# Patient Record
Sex: Female | Born: 1960
Health system: Southern US, Community
[De-identification: ages and names within clinical notes are randomized; demographics above are authoritative.]

## PROBLEM LIST (undated history)

## (undated) DIAGNOSIS — K5900 Constipation, unspecified: Secondary | ICD-10-CM

## (undated) DIAGNOSIS — R600 Localized edema: Secondary | ICD-10-CM

## (undated) DIAGNOSIS — R0683 Snoring: Secondary | ICD-10-CM

## (undated) DIAGNOSIS — F419 Anxiety disorder, unspecified: Secondary | ICD-10-CM

## (undated) DIAGNOSIS — D219 Benign neoplasm of connective and other soft tissue, unspecified: Secondary | ICD-10-CM

## (undated) DIAGNOSIS — E669 Obesity, unspecified: Secondary | ICD-10-CM

## (undated) DIAGNOSIS — C801 Malignant (primary) neoplasm, unspecified: Secondary | ICD-10-CM

## (undated) DIAGNOSIS — M2550 Pain in unspecified joint: Secondary | ICD-10-CM

## (undated) DIAGNOSIS — C539 Malignant neoplasm of cervix uteri, unspecified: Secondary | ICD-10-CM

## (undated) DIAGNOSIS — M549 Dorsalgia, unspecified: Secondary | ICD-10-CM

## (undated) DIAGNOSIS — R7303 Prediabetes: Secondary | ICD-10-CM

## (undated) DIAGNOSIS — I10 Essential (primary) hypertension: Secondary | ICD-10-CM

## (undated) DIAGNOSIS — C439 Malignant melanoma of skin, unspecified: Secondary | ICD-10-CM

## (undated) DIAGNOSIS — F909 Attention-deficit hyperactivity disorder, unspecified type: Secondary | ICD-10-CM

## (undated) DIAGNOSIS — M25519 Pain in unspecified shoulder: Secondary | ICD-10-CM

## (undated) HISTORY — DX: Pain in unspecified shoulder: M25.519

## (undated) HISTORY — DX: Malignant (primary) neoplasm, unspecified: C80.1

## (undated) HISTORY — DX: Benign neoplasm of connective and other soft tissue, unspecified: D21.9

## (undated) HISTORY — PX: MELANOMA EXCISION: SHX5266

## (undated) HISTORY — PX: DILATION AND CURETTAGE OF UTERUS: SHX78

## (undated) HISTORY — DX: Dorsalgia, unspecified: M54.9

## (undated) HISTORY — PX: TONSILLECTOMY: SUR1361

## (undated) HISTORY — DX: Attention-deficit hyperactivity disorder, unspecified type: F90.9

## (undated) HISTORY — DX: Prediabetes: R73.03

## (undated) HISTORY — PX: OTHER SURGICAL HISTORY: SHX169

## (undated) HISTORY — DX: Malignant neoplasm of cervix uteri, unspecified: C53.9

## (undated) HISTORY — DX: Pain in unspecified joint: M25.50

## (undated) HISTORY — DX: Obesity, unspecified: E66.9

## (undated) HISTORY — PX: BUNIONECTOMY: SHX129

## (undated) HISTORY — DX: Snoring: R06.83

## (undated) HISTORY — DX: Constipation, unspecified: K59.00

## (undated) HISTORY — DX: Essential (primary) hypertension: I10

## (undated) HISTORY — PX: FOOT SURGERY: SHX648

## (undated) HISTORY — DX: Localized edema: R60.0

## (undated) HISTORY — DX: Malignant melanoma of skin, unspecified: C43.9

## (undated) HISTORY — DX: Anxiety disorder, unspecified: F41.9

---

## 1985-11-11 DIAGNOSIS — C539 Malignant neoplasm of cervix uteri, unspecified: Secondary | ICD-10-CM

## 1985-11-11 HISTORY — PX: CERVICAL CONE BIOPSY: SUR198

## 1985-11-11 HISTORY — DX: Malignant neoplasm of cervix uteri, unspecified: C53.9

## 1998-02-28 ENCOUNTER — Other Ambulatory Visit: Admission: RE | Admit: 1998-02-28 | Discharge: 1998-02-28 | Payer: Self-pay | Admitting: Obstetrics and Gynecology

## 1998-09-05 ENCOUNTER — Other Ambulatory Visit: Admission: RE | Admit: 1998-09-05 | Discharge: 1998-09-05 | Payer: Self-pay | Admitting: Obstetrics and Gynecology

## 1999-02-20 ENCOUNTER — Other Ambulatory Visit: Admission: RE | Admit: 1999-02-20 | Discharge: 1999-02-20 | Payer: Self-pay | Admitting: Obstetrics and Gynecology

## 1999-09-18 ENCOUNTER — Other Ambulatory Visit: Admission: RE | Admit: 1999-09-18 | Discharge: 1999-09-18 | Payer: Self-pay | Admitting: Obstetrics and Gynecology

## 1999-11-19 ENCOUNTER — Inpatient Hospital Stay (HOSPITAL_COMMUNITY): Admission: AD | Admit: 1999-11-19 | Discharge: 1999-11-19 | Payer: Self-pay | Admitting: Obstetrics & Gynecology

## 2000-03-10 ENCOUNTER — Ambulatory Visit (HOSPITAL_COMMUNITY): Admission: RE | Admit: 2000-03-10 | Discharge: 2000-03-10 | Payer: Self-pay | Admitting: Obstetrics and Gynecology

## 2000-03-16 ENCOUNTER — Inpatient Hospital Stay (HOSPITAL_COMMUNITY): Admission: AD | Admit: 2000-03-16 | Discharge: 2000-03-16 | Payer: Self-pay | Admitting: Obstetrics and Gynecology

## 2000-03-24 ENCOUNTER — Inpatient Hospital Stay (HOSPITAL_COMMUNITY): Admission: AD | Admit: 2000-03-24 | Discharge: 2000-03-28 | Payer: Self-pay | Admitting: Obstetrics and Gynecology

## 2000-03-29 ENCOUNTER — Encounter: Admission: RE | Admit: 2000-03-29 | Discharge: 2000-05-02 | Payer: Self-pay | Admitting: Obstetrics & Gynecology

## 2000-05-07 ENCOUNTER — Other Ambulatory Visit: Admission: RE | Admit: 2000-05-07 | Discharge: 2000-05-07 | Payer: Self-pay | Admitting: Obstetrics and Gynecology

## 2000-11-18 ENCOUNTER — Other Ambulatory Visit: Admission: RE | Admit: 2000-11-18 | Discharge: 2000-11-18 | Payer: Self-pay | Admitting: Obstetrics and Gynecology

## 2001-05-29 ENCOUNTER — Encounter: Admission: RE | Admit: 2001-05-29 | Discharge: 2001-05-29 | Payer: Self-pay | Admitting: Obstetrics and Gynecology

## 2001-05-29 ENCOUNTER — Encounter: Payer: Self-pay | Admitting: Obstetrics and Gynecology

## 2002-02-16 ENCOUNTER — Encounter: Admission: RE | Admit: 2002-02-16 | Discharge: 2002-02-16 | Payer: Self-pay | Admitting: Obstetrics and Gynecology

## 2002-02-16 ENCOUNTER — Encounter: Payer: Self-pay | Admitting: Obstetrics and Gynecology

## 2003-03-29 ENCOUNTER — Encounter: Payer: Self-pay | Admitting: Obstetrics and Gynecology

## 2003-03-29 ENCOUNTER — Encounter: Admission: RE | Admit: 2003-03-29 | Discharge: 2003-03-29 | Payer: Self-pay | Admitting: Obstetrics and Gynecology

## 2003-07-28 ENCOUNTER — Other Ambulatory Visit: Admission: RE | Admit: 2003-07-28 | Discharge: 2003-07-28 | Payer: Self-pay | Admitting: Obstetrics and Gynecology

## 2003-12-22 ENCOUNTER — Other Ambulatory Visit: Admission: RE | Admit: 2003-12-22 | Discharge: 2003-12-22 | Payer: Self-pay | Admitting: Obstetrics and Gynecology

## 2004-02-13 ENCOUNTER — Encounter: Admission: RE | Admit: 2004-02-13 | Discharge: 2004-02-13 | Payer: Self-pay | Admitting: Family Medicine

## 2004-05-09 ENCOUNTER — Other Ambulatory Visit: Admission: RE | Admit: 2004-05-09 | Discharge: 2004-05-09 | Payer: Self-pay | Admitting: Obstetrics and Gynecology

## 2004-08-03 ENCOUNTER — Encounter: Admission: RE | Admit: 2004-08-03 | Discharge: 2004-08-03 | Payer: Self-pay | Admitting: Obstetrics and Gynecology

## 2004-08-06 ENCOUNTER — Other Ambulatory Visit: Admission: RE | Admit: 2004-08-06 | Discharge: 2004-08-06 | Payer: Self-pay | Admitting: Obstetrics and Gynecology

## 2004-08-09 ENCOUNTER — Encounter: Admission: RE | Admit: 2004-08-09 | Discharge: 2004-08-09 | Payer: Self-pay | Admitting: Obstetrics and Gynecology

## 2004-09-28 ENCOUNTER — Ambulatory Visit: Payer: Self-pay | Admitting: Internal Medicine

## 2004-10-26 ENCOUNTER — Ambulatory Visit: Payer: Self-pay | Admitting: Internal Medicine

## 2004-11-23 ENCOUNTER — Ambulatory Visit: Payer: Self-pay | Admitting: Internal Medicine

## 2004-12-24 ENCOUNTER — Ambulatory Visit: Payer: Self-pay | Admitting: Internal Medicine

## 2005-02-05 ENCOUNTER — Ambulatory Visit: Payer: Self-pay | Admitting: Internal Medicine

## 2005-03-22 ENCOUNTER — Other Ambulatory Visit: Admission: RE | Admit: 2005-03-22 | Discharge: 2005-03-22 | Payer: Self-pay | Admitting: Obstetrics and Gynecology

## 2005-08-05 ENCOUNTER — Ambulatory Visit: Payer: Self-pay | Admitting: Internal Medicine

## 2005-09-12 ENCOUNTER — Ambulatory Visit: Payer: Self-pay | Admitting: Internal Medicine

## 2005-11-14 ENCOUNTER — Encounter: Admission: RE | Admit: 2005-11-14 | Discharge: 2005-11-14 | Payer: Self-pay | Admitting: Obstetrics and Gynecology

## 2005-11-14 IMAGING — MG MM MAMMO SCREENING
5 series · 5 of 5 positions shown · non-contrast
Comparison: none

SCREENING MAMMOGRAM:
There is a fibroglandular pattern.  No masses or malignant type calcifications are identified.  
Compared with prior studies.

[R CC]
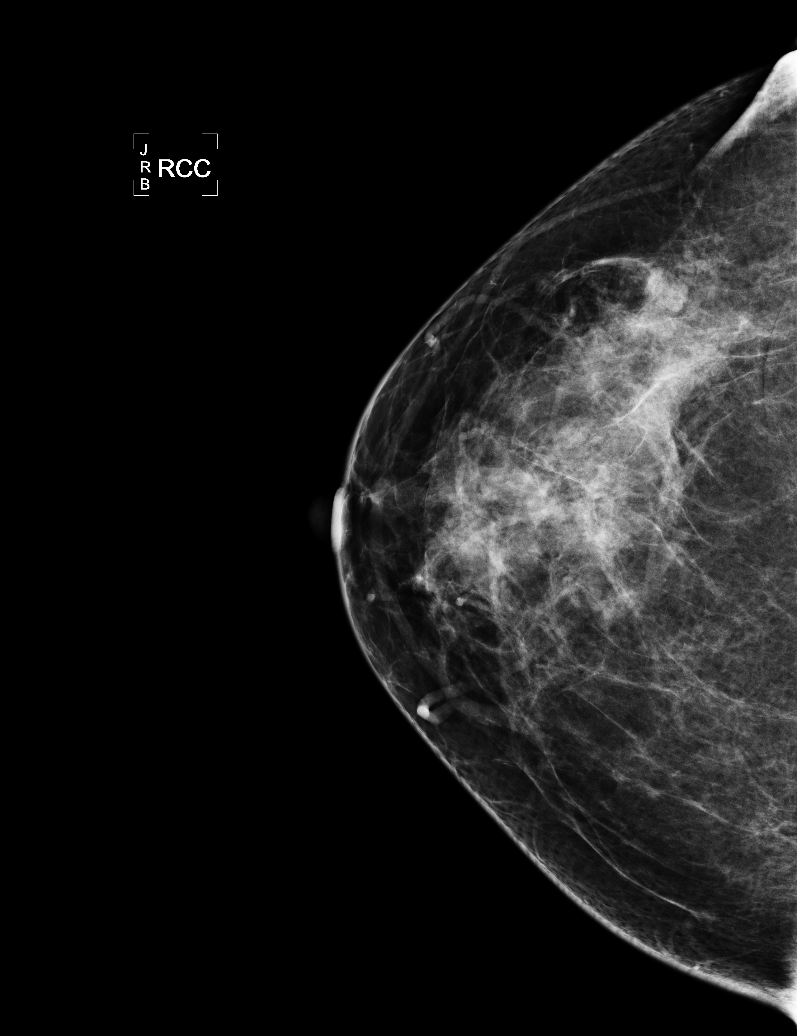

[R MLO]
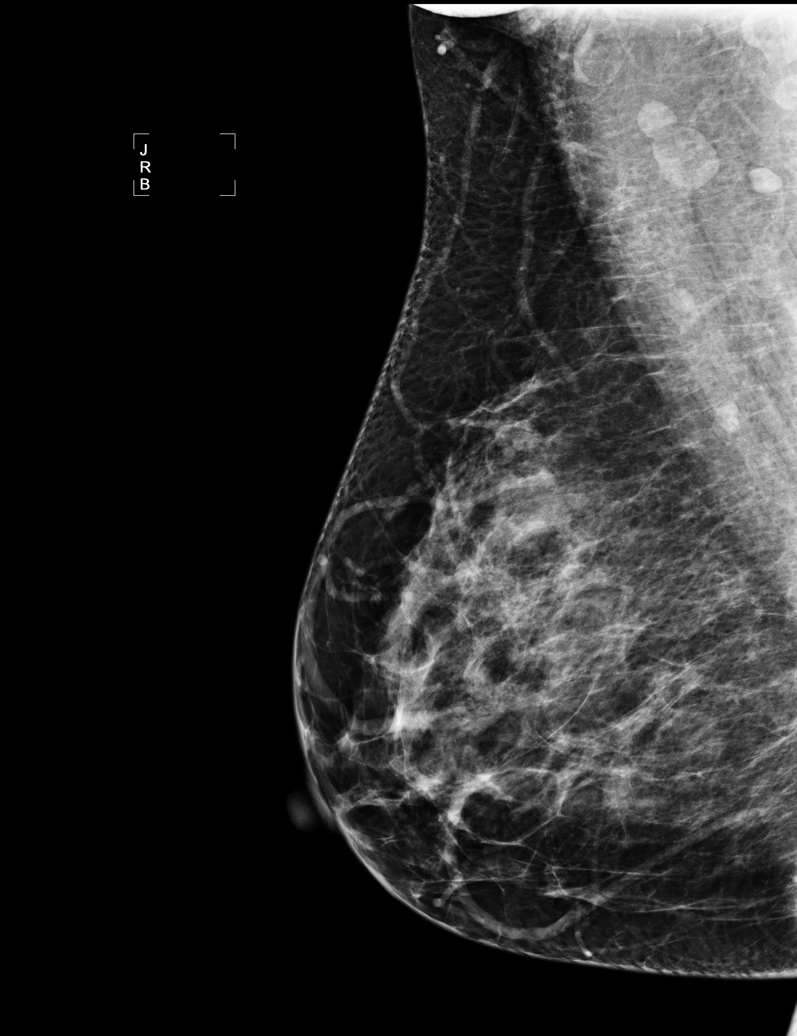

[L CC]
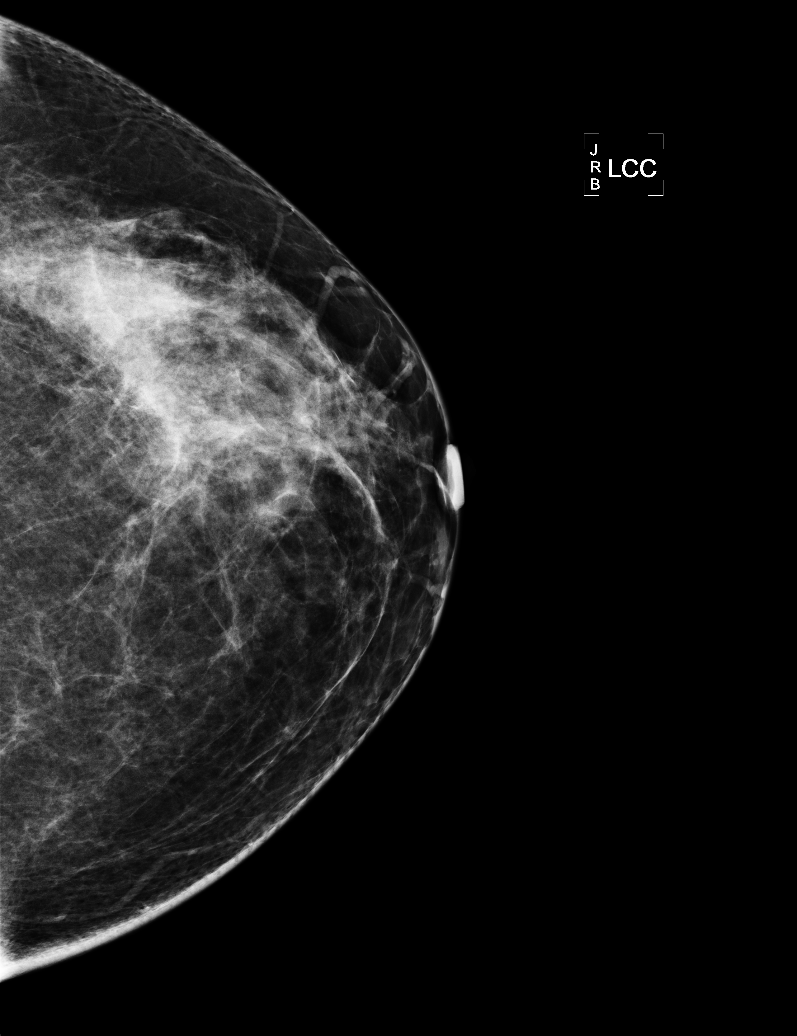

[L MLO (1 of 2)]
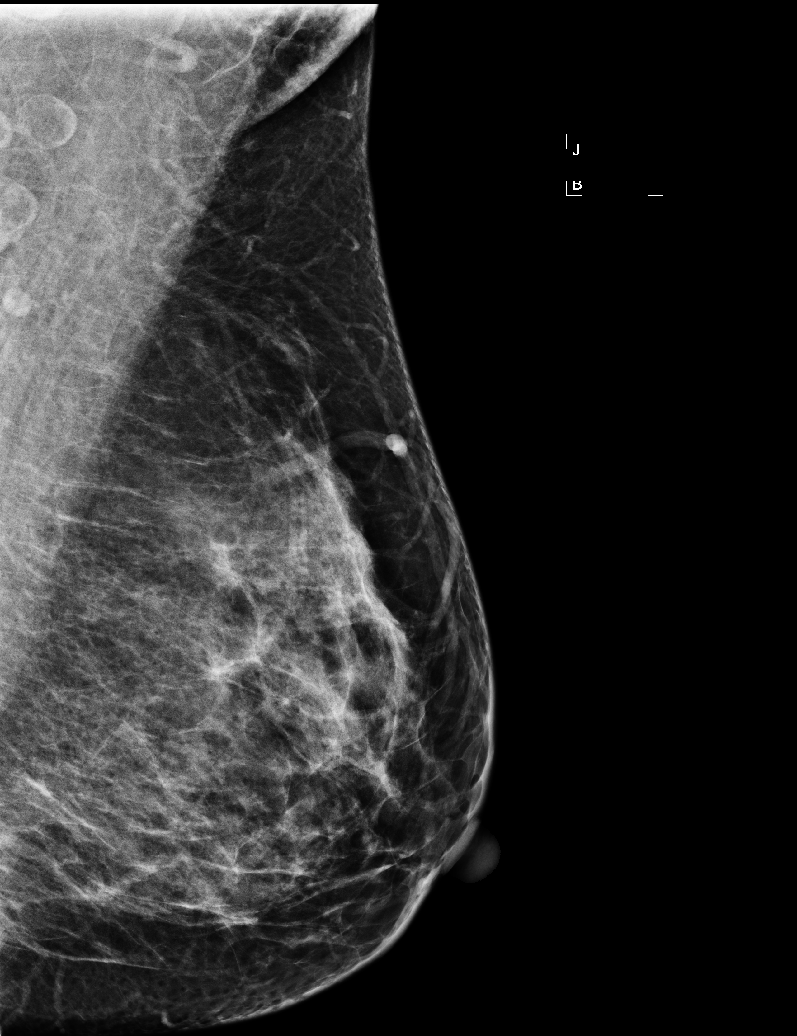

[L MLO (2 of 2)]
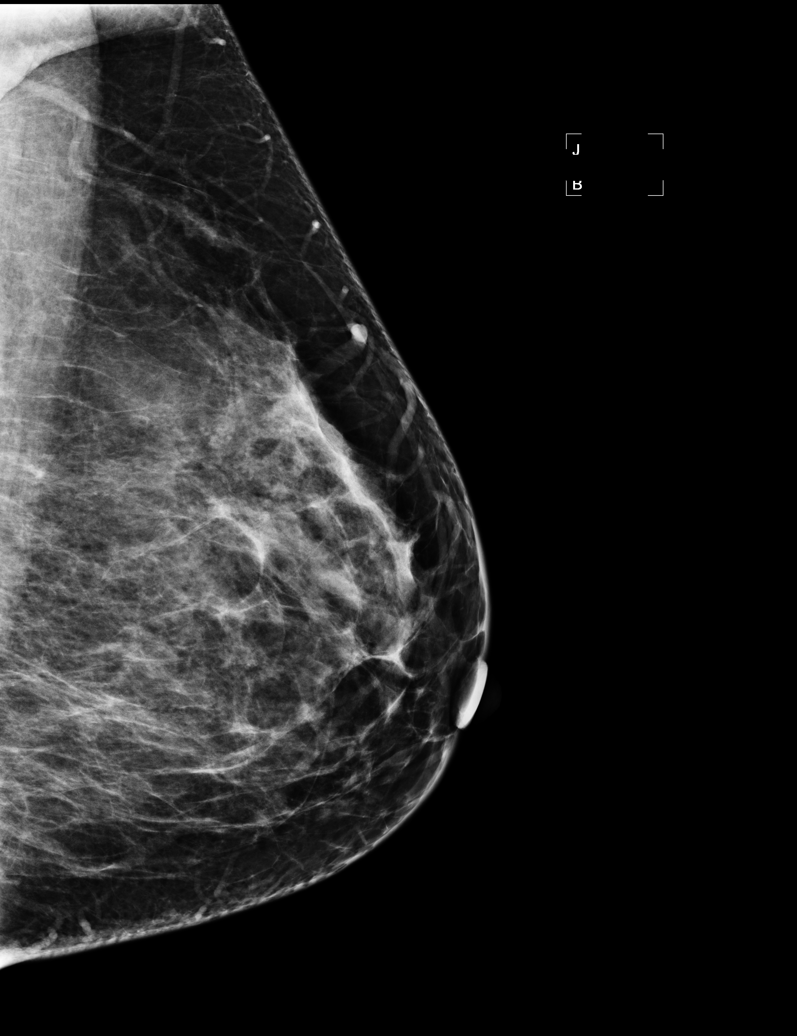

[5 of 5 positions shown; findings below may reference images not displayed]

IMPRESSION: No specific mammographic evidence of malignancy.  Next screening mammogram is recommended in one 
year.

ASSESSMENT: Negative - BI-RADS 1

Screening mammogram in 1 year.

## 2005-12-12 ENCOUNTER — Ambulatory Visit: Payer: Self-pay | Admitting: Internal Medicine

## 2005-12-19 ENCOUNTER — Ambulatory Visit: Payer: Self-pay | Admitting: Internal Medicine

## 2006-03-19 ENCOUNTER — Ambulatory Visit: Payer: Self-pay | Admitting: Internal Medicine

## 2006-03-28 ENCOUNTER — Ambulatory Visit: Payer: Self-pay | Admitting: Internal Medicine

## 2006-04-08 ENCOUNTER — Other Ambulatory Visit: Admission: RE | Admit: 2006-04-08 | Discharge: 2006-04-08 | Payer: Self-pay | Admitting: Obstetrics and Gynecology

## 2006-08-27 ENCOUNTER — Ambulatory Visit: Payer: Self-pay | Admitting: Internal Medicine

## 2006-08-28 ENCOUNTER — Ambulatory Visit: Payer: Self-pay | Admitting: Internal Medicine

## 2006-09-25 ENCOUNTER — Ambulatory Visit: Payer: Self-pay | Admitting: Internal Medicine

## 2006-10-10 ENCOUNTER — Other Ambulatory Visit: Admission: RE | Admit: 2006-10-10 | Discharge: 2006-10-10 | Payer: Self-pay | Admitting: Obstetrics and Gynecology

## 2006-10-24 ENCOUNTER — Ambulatory Visit: Payer: Self-pay | Admitting: Internal Medicine

## 2006-12-10 ENCOUNTER — Encounter: Admission: RE | Admit: 2006-12-10 | Discharge: 2006-12-10 | Payer: Self-pay | Admitting: Obstetrics and Gynecology

## 2007-01-13 ENCOUNTER — Ambulatory Visit: Payer: Self-pay | Admitting: Internal Medicine

## 2007-01-24 ENCOUNTER — Ambulatory Visit: Payer: Self-pay | Admitting: Family Medicine

## 2007-02-17 ENCOUNTER — Ambulatory Visit: Payer: Self-pay | Admitting: Internal Medicine

## 2007-03-24 ENCOUNTER — Ambulatory Visit: Payer: Self-pay | Admitting: Internal Medicine

## 2007-04-08 ENCOUNTER — Other Ambulatory Visit: Admission: RE | Admit: 2007-04-08 | Discharge: 2007-04-08 | Payer: Self-pay | Admitting: Obstetrics and Gynecology

## 2007-06-11 ENCOUNTER — Encounter: Payer: Self-pay | Admitting: Internal Medicine

## 2007-09-10 ENCOUNTER — Ambulatory Visit: Payer: Self-pay | Admitting: Internal Medicine

## 2007-09-10 DIAGNOSIS — F339 Major depressive disorder, recurrent, unspecified: Secondary | ICD-10-CM | POA: Insufficient documentation

## 2007-09-10 DIAGNOSIS — F988 Other specified behavioral and emotional disorders with onset usually occurring in childhood and adolescence: Secondary | ICD-10-CM | POA: Insufficient documentation

## 2007-09-10 DIAGNOSIS — E669 Obesity, unspecified: Secondary | ICD-10-CM | POA: Insufficient documentation

## 2007-09-18 ENCOUNTER — Telehealth: Payer: Self-pay | Admitting: Family Medicine

## 2007-10-23 ENCOUNTER — Other Ambulatory Visit: Admission: RE | Admit: 2007-10-23 | Discharge: 2007-10-23 | Payer: Self-pay | Admitting: Obstetrics and Gynecology

## 2007-11-25 ENCOUNTER — Ambulatory Visit: Payer: Self-pay | Admitting: Internal Medicine

## 2007-11-25 LAB — CONVERTED CEMR LAB
ALT: 23 units/L (ref 0–35)
BUN: 13 mg/dL (ref 6–23)
Basophils Absolute: 0 10*3/uL (ref 0.0–0.1)
Bilirubin, Direct: 0.1 mg/dL (ref 0.0–0.3)
Chloride: 102 meq/L (ref 96–112)
Cholesterol: 192 mg/dL (ref 0–200)
Creatinine, Ser: 0.7 mg/dL (ref 0.4–1.2)
GFR calc Af Amer: 115 mL/min
Glucose, Bld: 96 mg/dL (ref 70–99)
Glucose, Urine, Semiquant: 100
Ketones, urine, test strip: NEGATIVE
LDL Cholesterol: 116 mg/dL — ABNORMAL HIGH (ref 0–99)
Lymphocytes Relative: 21.3 % (ref 12.0–46.0)
Monocytes Absolute: 0.5 10*3/uL (ref 0.2–0.7)
Monocytes Relative: 8.3 % (ref 3.0–11.0)
Neutro Abs: 4.5 10*3/uL (ref 1.4–7.7)
Specific Gravity, Urine: 1.02
Total Bilirubin: 0.7 mg/dL (ref 0.3–1.2)
Total CHOL/HDL Ratio: 3.1
Triglycerides: 76 mg/dL (ref 0–149)
WBC Urine, dipstick: NEGATIVE
pH: 5.5

## 2007-12-02 ENCOUNTER — Ambulatory Visit: Payer: Self-pay | Admitting: Internal Medicine

## 2007-12-02 DIAGNOSIS — J069 Acute upper respiratory infection, unspecified: Secondary | ICD-10-CM | POA: Insufficient documentation

## 2007-12-28 ENCOUNTER — Ambulatory Visit: Payer: Self-pay | Admitting: Internal Medicine

## 2008-04-11 ENCOUNTER — Encounter: Admission: RE | Admit: 2008-04-11 | Discharge: 2008-04-11 | Payer: Self-pay | Admitting: Obstetrics and Gynecology

## 2008-04-14 ENCOUNTER — Ambulatory Visit: Payer: Self-pay | Admitting: Internal Medicine

## 2008-04-18 ENCOUNTER — Other Ambulatory Visit: Admission: RE | Admit: 2008-04-18 | Discharge: 2008-04-18 | Payer: Self-pay | Admitting: Obstetrics and Gynecology

## 2008-05-01 ENCOUNTER — Telehealth: Payer: Self-pay | Admitting: Internal Medicine

## 2008-05-02 ENCOUNTER — Telehealth: Payer: Self-pay | Admitting: Internal Medicine

## 2008-05-03 ENCOUNTER — Telehealth: Payer: Self-pay | Admitting: Internal Medicine

## 2008-07-08 ENCOUNTER — Telehealth: Payer: Self-pay | Admitting: Internal Medicine

## 2008-08-12 ENCOUNTER — Ambulatory Visit: Payer: Self-pay | Admitting: Internal Medicine

## 2008-08-12 DIAGNOSIS — C439 Malignant melanoma of skin, unspecified: Secondary | ICD-10-CM | POA: Insufficient documentation

## 2008-11-16 ENCOUNTER — Ambulatory Visit: Payer: Self-pay | Admitting: Internal Medicine

## 2008-11-16 DIAGNOSIS — M549 Dorsalgia, unspecified: Secondary | ICD-10-CM | POA: Insufficient documentation

## 2008-11-30 ENCOUNTER — Other Ambulatory Visit: Admission: RE | Admit: 2008-11-30 | Discharge: 2008-11-30 | Payer: Self-pay | Admitting: Obstetrics and Gynecology

## 2009-01-08 LAB — CONVERTED CEMR LAB: Pap Smear: NORMAL

## 2009-02-27 ENCOUNTER — Telehealth: Payer: Self-pay | Admitting: Internal Medicine

## 2009-05-31 ENCOUNTER — Ambulatory Visit: Payer: Self-pay | Admitting: Internal Medicine

## 2009-07-03 ENCOUNTER — Encounter: Admission: RE | Admit: 2009-07-03 | Discharge: 2009-07-03 | Payer: Self-pay | Admitting: Obstetrics and Gynecology

## 2009-09-01 ENCOUNTER — Ambulatory Visit: Payer: Self-pay | Admitting: Internal Medicine

## 2009-09-01 LAB — CONVERTED CEMR LAB
Alkaline Phosphatase: 52 units/L (ref 39–117)
BUN: 20 mg/dL (ref 6–23)
Basophils Absolute: 0 10*3/uL (ref 0.0–0.1)
CO2: 28 meq/L (ref 19–32)
Cholesterol: 195 mg/dL (ref 0–200)
Eosinophils Absolute: 0.1 10*3/uL (ref 0.0–0.7)
Glucose, Bld: 101 mg/dL — ABNORMAL HIGH (ref 70–99)
HCT: 41.3 % (ref 36.0–46.0)
Hemoglobin, Urine: NEGATIVE
Hemoglobin: 14.2 g/dL (ref 12.0–15.0)
Lymphs Abs: 1.3 10*3/uL (ref 0.7–4.0)
Monocytes Relative: 7.5 % (ref 3.0–12.0)
Neutro Abs: 5.2 10*3/uL (ref 1.4–7.7)
Neutrophils Relative %: 72 % (ref 43.0–77.0)
Potassium: 4.7 meq/L (ref 3.5–5.1)
RDW: 12.1 % (ref 11.5–14.6)
Sodium: 141 meq/L (ref 135–145)
Total CHOL/HDL Ratio: 3
Total Protein, Urine: NEGATIVE mg/dL
VLDL: 11.4 mg/dL (ref 0.0–40.0)

## 2009-09-06 ENCOUNTER — Telehealth: Payer: Self-pay | Admitting: Internal Medicine

## 2009-10-03 ENCOUNTER — Telehealth: Payer: Self-pay | Admitting: Internal Medicine

## 2009-10-04 ENCOUNTER — Ambulatory Visit: Payer: Self-pay | Admitting: Internal Medicine

## 2009-10-09 ENCOUNTER — Ambulatory Visit: Payer: Self-pay | Admitting: Internal Medicine

## 2009-10-12 ENCOUNTER — Ambulatory Visit: Payer: Self-pay | Admitting: Internal Medicine

## 2009-10-18 ENCOUNTER — Telehealth: Payer: Self-pay | Admitting: Internal Medicine

## 2009-11-29 ENCOUNTER — Ambulatory Visit: Payer: Self-pay | Admitting: Internal Medicine

## 2009-11-29 DIAGNOSIS — J329 Chronic sinusitis, unspecified: Secondary | ICD-10-CM | POA: Insufficient documentation

## 2009-11-29 DIAGNOSIS — I10 Essential (primary) hypertension: Secondary | ICD-10-CM | POA: Insufficient documentation

## 2009-12-01 ENCOUNTER — Other Ambulatory Visit: Admission: RE | Admit: 2009-12-01 | Discharge: 2009-12-01 | Payer: Self-pay | Admitting: Obstetrics and Gynecology

## 2009-12-13 ENCOUNTER — Telehealth: Payer: Self-pay | Admitting: Internal Medicine

## 2010-01-17 ENCOUNTER — Ambulatory Visit: Payer: Self-pay | Admitting: Internal Medicine

## 2010-01-17 DIAGNOSIS — G44009 Cluster headache syndrome, unspecified, not intractable: Secondary | ICD-10-CM | POA: Insufficient documentation

## 2010-05-02 ENCOUNTER — Telehealth: Payer: Self-pay | Admitting: Internal Medicine

## 2010-05-03 ENCOUNTER — Ambulatory Visit: Payer: Self-pay | Admitting: Family Medicine

## 2010-05-04 DIAGNOSIS — R0989 Other specified symptoms and signs involving the circulatory and respiratory systems: Secondary | ICD-10-CM | POA: Insufficient documentation

## 2010-05-04 DIAGNOSIS — R0609 Other forms of dyspnea: Secondary | ICD-10-CM | POA: Insufficient documentation

## 2010-05-16 ENCOUNTER — Ambulatory Visit: Payer: Self-pay | Admitting: Internal Medicine

## 2010-05-16 DIAGNOSIS — N951 Menopausal and female climacteric states: Secondary | ICD-10-CM | POA: Insufficient documentation

## 2010-06-14 ENCOUNTER — Ambulatory Visit: Payer: Self-pay | Admitting: Internal Medicine

## 2010-07-09 ENCOUNTER — Telehealth: Payer: Self-pay | Admitting: Internal Medicine

## 2010-07-18 ENCOUNTER — Encounter (INDEPENDENT_AMBULATORY_CARE_PROVIDER_SITE_OTHER): Payer: Self-pay | Admitting: *Deleted

## 2010-07-20 ENCOUNTER — Encounter (INDEPENDENT_AMBULATORY_CARE_PROVIDER_SITE_OTHER): Payer: Self-pay | Admitting: *Deleted

## 2010-07-23 ENCOUNTER — Ambulatory Visit: Payer: Self-pay | Admitting: Gastroenterology

## 2010-07-27 ENCOUNTER — Encounter: Payer: Self-pay | Admitting: Internal Medicine

## 2010-07-27 ENCOUNTER — Telehealth (INDEPENDENT_AMBULATORY_CARE_PROVIDER_SITE_OTHER): Payer: Self-pay | Admitting: *Deleted

## 2010-08-01 ENCOUNTER — Encounter: Admission: RE | Admit: 2010-08-01 | Discharge: 2010-08-01 | Payer: Self-pay | Admitting: Gynecology

## 2010-08-03 ENCOUNTER — Ambulatory Visit: Payer: Self-pay | Admitting: Gastroenterology

## 2010-08-09 ENCOUNTER — Other Ambulatory Visit: Admission: RE | Admit: 2010-08-09 | Discharge: 2010-08-09 | Payer: Self-pay | Admitting: Gynecology

## 2010-08-09 ENCOUNTER — Ambulatory Visit: Payer: Self-pay | Admitting: Gynecology

## 2010-08-28 ENCOUNTER — Ambulatory Visit: Payer: Self-pay | Admitting: Internal Medicine

## 2010-09-03 ENCOUNTER — Encounter: Payer: Self-pay | Admitting: Internal Medicine

## 2010-11-29 ENCOUNTER — Ambulatory Visit: Admit: 2010-11-29 | Payer: Self-pay | Admitting: Internal Medicine

## 2010-12-11 NOTE — Letter (Signed)
Summary: [facility]   Imported By: Maryln Gottron 09/10/2010 09:50:17  _____________________________________________________________________  External Attachment:    Type:   Image     Comment:   External Document

## 2010-12-11 NOTE — Assessment & Plan Note (Signed)
Summary: 3 month rov/njr/pt rsc/cjr   Vital Signs:  Patient profile:   50 year old female Height:      70 inches Weight:      241 pounds BMI:     34.70 Temp:     98.2 degrees F oral Pulse rate:   72 / minute Resp:     14 per minute BP sitting:   140 / 80  (left arm)  Vitals Entered By: Willy Eddy, LPN (May 16, 1609 9:29 AM) CC: roa- stopped errin while at the beach and feels better--has sleep study scheduled for 8-3 p not sure if she will go, Hypertension Management   CC:  roa- stopped errin while at the beach and feels better--has sleep study scheduled for 8-3 p not sure if she will go and Hypertension Management.  History of Present Illness: having daily head aches/ cluster type with sudden recurrance seen for this with Dr Juanna Cao consideration for change in blood pressure medication discussed with verapamil protocol the citalopram generic works well   Hypertension History:      She denies headache, chest pain, palpitations, dyspnea with exertion, orthopnea, PND, peripheral edema, visual symptoms, neurologic problems, syncope, and side effects from treatment.        Positive major cardiovascular risk factors include hypertension.  Negative major cardiovascular risk factors include female age less than 57 years old, negative family history for ischemic heart disease, and non-tobacco-user status.     Preventive Screening-Counseling & Management  Alcohol-Tobacco     Smoking Status: never     Passive Smoke Exposure: no  Problems Prior to Update: 1)  Snoring  (ICD-786.09) 2)  Headache, Cluster  (ICD-339.00) 3)  Hypertension, Systolic, Borderline  (ICD-401.9) 4)  Sinusitis, Chronic  (ICD-473.9) 5)  Back Pain, Chronic, Intermittent  (ICD-724.5) 6)  Uri  (ICD-465.9) 7)  Malignant Melanoma Other Specified Sites Skin  (ICD-172.8) 8)  Hemoccult Positive Stool  (ICD-578.1) 9)  Viral Uri  (ICD-465.9) 10)  Preventive Health Care  (ICD-V70.0) 11)  Obesity Nos   (ICD-278.00) 12)  Depression  (ICD-311) 13)  Disorder, Attention Deficit w/o Hyperactivity  (ICD-314.00)  Current Problems (verified): 1)  Snoring  (ICD-786.09) 2)  Headache, Cluster  (ICD-339.00) 3)  Hypertension, Systolic, Borderline  (ICD-401.9) 4)  Sinusitis, Chronic  (ICD-473.9) 5)  Back Pain, Chronic, Intermittent  (ICD-724.5) 6)  Uri  (ICD-465.9) 7)  Malignant Melanoma Other Specified Sites Skin  (ICD-172.8) 8)  Hemoccult Positive Stool  (ICD-578.1) 9)  Viral Uri  (ICD-465.9) 10)  Preventive Health Care  (ICD-V70.0) 11)  Obesity Nos  (ICD-278.00) 12)  Depression  (ICD-311) 13)  Disorder, Attention Deficit w/o Hyperactivity  (ICD-314.00)  Medications Prior to Update: 1)  Errin 0.35 Mg  Tabs (Norethindrone (Contraceptive)) .... Take 1 Tablet By Mouth Once A Day 2)  Adderall Xr 30 Mg  Cp24 (Amphetamine-Dextroamphetamine) .... Take 1 Tablet By Mouth Once A Day 3)  Oscal 500/200 D-3 500-200 Mg-Unit  Tabs (Calcium-Vitamin D) .... Once Daily 4)  Adderall 5 Mg Tabs (Amphetamine-Dextroamphetamine) .... One By Mouth After Noon As Needed 5)  Fluconazole 150 Mg Tabs (Fluconazole) .Marland Kitchen.. 1 Every Week(Podiastrist) 6)  Bisoprolol-Hydrochlorothiazide 10-6.25 Mg Tabs (Bisoprolol-Hydrochlorothiazide) .... One By Mouth Daily 7)  Zithromax 250 Mg Tabs (Azithromycin) .... 2 Tabs By Mouth Once Then 1 Tab By Mouth Daily X 4 Days 8)  Citalopram Hydrobromide 10 Mg Tabs (Citalopram Hydrobromide) .Marland Kitchen.. 1 Tab By Mouth Daily 9)  Bupap 50-650 Mg Tabs (Butalbital-Acetaminophen) .Marland Kitchen.. 1 Tab By Mouth Q  6 Hour As Needed Ha 10)  Flonase 50 Mcg/act Susp (Fluticasone Propionate) .... 2 Sprays Ea Nostril Daily As Needed Congestion 11)  Cetirizine Hcl 10 Mg Tabs (Cetirizine Hcl) .Marland Kitchen.. 1 Tab By Mouth Daily As Needed Allergies  Current Medications (verified): 1)  Errin 0.35 Mg  Tabs (Norethindrone (Contraceptive)) .... Take 1 Tablet By Mouth Once A Day 2)  Adderall Xr 30 Mg  Cp24 (Amphetamine-Dextroamphetamine) .... Take  1 Tablet By Mouth Once A Day 3)  Oscal 500/200 D-3 500-200 Mg-Unit  Tabs (Calcium-Vitamin D) .... Once Daily 4)  Adderall 5 Mg Tabs (Amphetamine-Dextroamphetamine) .... One By Mouth After Noon As Needed 5)  Fluconazole 150 Mg Tabs (Fluconazole) .Marland Kitchen.. 1 Every Week(Podiastrist) 6)  Bisoprolol-Hydrochlorothiazide 10-6.25 Mg Tabs (Bisoprolol-Hydrochlorothiazide) .... One By Mouth Daily 7)  Citalopram Hydrobromide 10 Mg Tabs (Citalopram Hydrobromide) .Marland Kitchen.. 1 Tab By Mouth Daily 8)  Bupap 50-650 Mg Tabs (Butalbital-Acetaminophen) .Marland Kitchen.. 1 Tab By Mouth Q 6 Hour As Needed Ha 9)  Flonase 50 Mcg/act Susp (Fluticasone Propionate) .... 2 Sprays Ea Nostril Daily As Needed Congestion 10)  Cetirizine Hcl 10 Mg Tabs (Cetirizine Hcl) .Marland Kitchen.. 1 Tab By Mouth Daily As Needed Allergies 11)  Verapamil Hcl Cr 180 Mg Xr24h-Cap (Verapamil Hcl) .... One By Mouth Daily  Allergies (verified): No Known Drug Allergies  Past History:  Family History: Last updated: 12/02/2007 father... Family History High cholesterol mother...  Family History of Stroke M 1st degree relative <50  mother had a small stroke wth normal lipid and HTN Family History Osteoporosis  Social History: Last updated: 05/07/2007 Married Never Smoked Alcohol use-yes  Risk Factors: Smoking Status: never (05/16/2010) Passive Smoke Exposure: no (05/16/2010)  Past medical, surgical, family and social histories (including risk factors) reviewed, and no changes noted (except as noted below).  Past Medical History: Reviewed history from 09/10/2007 and no changes required. Obesity ADHD Depression  hx of  Past Surgical History: Reviewed history from 05/07/2007 and no changes required. [id number] D&C x 2  Tonsillectomy Bunionectomy-2004  Family History: Reviewed history from 12/02/2007 and no changes required. father... Family History High cholesterol mother...  Family History of Stroke M 1st degree relative <50  mother had a small stroke wth  normal lipid and HTN Family History Osteoporosis  Social History: Reviewed history from 05/07/2007 and no changes required. Married Never Smoked Alcohol use-yes  Review of Systems  The patient denies anorexia, fever, weight loss, weight gain, vision loss, decreased hearing, hoarseness, chest pain, syncope, dyspnea on exertion, peripheral edema, prolonged cough, headaches, hemoptysis, abdominal pain, melena, hematochezia, severe indigestion/heartburn, hematuria, incontinence, genital sores, muscle weakness, suspicious skin lesions, transient blindness, difficulty walking, depression, unusual weight change, abnormal bleeding, enlarged lymph nodes, angioedema, breast masses, and testicular masses.    Physical Exam  General:  Well-developed,well-nourished,in no acute distress; alert,appropriate and cooperative throughout examination Eyes:  No corneal or conjunctival inflammation noted. EOMI. Perrla. Funduscopic exam benign, without hemorrhages, exudates or papilledema. Vision grossly normal. Ears:  R cerumen impaction and L Cerumen impaction.   Nose:  no external deformity, mucosal erythema, and mucosal edema.   Mouth:  Oral mucosa and oropharynx without lesions or exudates.  Teeth in good repair. Neck:  No deformities, masses, or tenderness noted. Heart:  Normal rate and regular rhythm. S1 and S2 normal without gallop, murmur, click, rub or other extra sounds. Abdomen:  Bowel sounds positive,abdomen soft and non-tender without masses, organomegaly or hernias noted.   Impression & Recommendations:  Problem # 1:  HEADACHE, CLUSTER (ICD-339.00) verapamil protocol  Problem #  2:  SNORING (ICD-786.09) weight loss Her updated medication list for this problem includes:    Bisoprolol-hydrochlorothiazide 10-6.25 Mg Tabs (Bisoprolol-hydrochlorothiazide) ..... One by mouth daily  Recommended fluid and salt restriction.   Problem # 3:  SYMPTOMATIC MENOPAUSAL/FEMALE CLIMACTERIC STATES  (ICD-627.2) improved head aches with change in HRT ( forgot to take) Discussed treatment options.   Problem # 4:  HYPERTENSION, SYSTOLIC, BORDERLINE (ICD-401.9)  Her updated medication list for this problem includes:    Bisoprolol-hydrochlorothiazide 10-6.25 Mg Tabs (Bisoprolol-hydrochlorothiazide) ..... One by mouth daily    Verapamil Hcl Cr 180 Mg Xr24h-cap (Verapamil hcl) ..... One by mouth daily  BP today: 140/80 Prior BP: 120/82 (05/03/2010)  Labs Reviewed: K+: 4.7 (09/01/2009) Creat: : 0.8 (09/01/2009)   Chol: 195 (09/01/2009)   HDL: 58.40 (09/01/2009)   LDL: 125 (09/01/2009)   TG: 57.0 (09/01/2009)  Problem # 5:  SINUSITIS, CHRONIC (ICD-473.9)  The following medications were removed from the medication list:    Zithromax 250 Mg Tabs (Azithromycin) .Marland Kitchen... 2 tabs by mouth once then 1 tab by mouth daily x 4 days Her updated medication list for this problem includes:    Flonase 50 Mcg/act Susp (Fluticasone propionate) .Marland Kitchen... 2 sprays ea nostril daily as needed congestion  Take antibiotics for full duration. Discussed treatment options including indications for coronal CT scan of sinuses and ENT referral.   Complete Medication List: 1)  Errin 0.35 Mg Tabs (Norethindrone (contraceptive)) .... Take 1 tablet by mouth once a day 2)  Adderall Xr 30 Mg Cp24 (Amphetamine-dextroamphetamine) .... Take 1 tablet by mouth once a day 3)  Oscal 500/200 D-3 500-200 Mg-unit Tabs (Calcium-vitamin d) .... Once daily 4)  Adderall 5 Mg Tabs (Amphetamine-dextroamphetamine) .... One by mouth after noon as needed 5)  Fluconazole 150 Mg Tabs (Fluconazole) .Marland Kitchen.. 1 every week(podiastrist) 6)  Bisoprolol-hydrochlorothiazide 10-6.25 Mg Tabs (Bisoprolol-hydrochlorothiazide) .... One by mouth daily 7)  Citalopram Hydrobromide 10 Mg Tabs (Citalopram hydrobromide) .Marland Kitchen.. 1 tab by mouth daily 8)  Bupap 50-650 Mg Tabs (Butalbital-acetaminophen) .Marland Kitchen.. 1 tab by mouth q 6 hour as needed ha 9)  Flonase 50 Mcg/act Susp  (Fluticasone propionate) .... 2 sprays ea nostril daily as needed congestion 10)  Cetirizine Hcl 10 Mg Tabs (Cetirizine hcl) .Marland Kitchen.. 1 tab by mouth daily as needed allergies 11)  Verapamil Hcl Cr 180 Mg Xr24h-cap (Verapamil hcl) .... One by mouth daily  Hypertension Assessment/Plan:      The patient's hypertensive risk group is category A: No risk factors and no target organ damage.  Her calculated 10 year risk of coronary heart disease is 6 %.  Today's blood pressure is 140/80.  Her blood pressure goal is < 140/90.  Patient Instructions: 1)  stay off the hormones for now 2)  start verapamil 180 for blood pressure and HA 3)  agree  with the citaloproam 4)  Please schedule a follow-up appointment in 1 month. Prescriptions: VERAPAMIL HCL CR 180 MG XR24H-CAP (VERAPAMIL HCL) one by mouth daily  #30 x 11   Entered and Authorized by:   Stacie Glaze MD   Signed by:   Stacie Glaze MD on 05/16/2010   Method used:   Electronically to        [facility] # [address] (retail)       [address]       [place], [place]  [address]       Ph: [phone]       Fax: [phone]   RxID:   [id number] ADDERALL  5 MG TABS (AMPHETAMINE-DEXTROAMPHETAMINE) one by mouth after noon as needed  #30 x 0   Entered by:   Willy Eddy, LPN   Authorized by:   Stacie Glaze MD   Signed by:   Willy Eddy, LPN on 16/08/9603   Method used:   Print then Give to Patient   RxID:   [id number] ADDERALL XR 30 MG  CP24 (AMPHETAMINE-DEXTROAMPHETAMINE) Take 1 tablet by mouth once a day  #30 x 0   Entered by:   Willy Eddy, LPN   Authorized by:   Stacie Glaze MD   Signed by:   Willy Eddy, LPN on 21/30/8657   Method used:   Print then Give to Patient   RxID:   [id number] ADDERALL 5 MG TABS (AMPHETAMINE-DEXTROAMPHETAMINE) one by mouth after noon as needed  #30 x 0   Entered by:   Willy Eddy, LPN   Authorized by:   Stacie Glaze MD   Signed by:   Willy Eddy, LPN on 11/13/7251   Method used:   Print then Give to Patient   RxID:   [id number] ADDERALL 5 MG TABS (AMPHETAMINE-DEXTROAMPHETAMINE) one by mouth after noon as needed  #30 x 0   Entered by:   Willy Eddy, LPN   Authorized by:   Stacie Glaze MD   Signed by:   Willy Eddy, LPN on 75/64/3329   Method used:   Print then Give to Patient   RxID:   [id number] ADDERALL XR 30 MG  CP24 (AMPHETAMINE-DEXTROAMPHETAMINE) Take 1 tablet by mouth once a day  #30 x 0   Entered by:   Willy Eddy, LPN   Authorized by:   Stacie Glaze MD   Signed by:   Willy Eddy, LPN on 09/32/3557   Method used:   Print then Give to Patient   RxID:   [id number] ADDERALL XR 30 MG  CP24 (AMPHETAMINE-DEXTROAMPHETAMINE) Take 1 tablet by mouth once a day  #30 x 0   Entered by:   Willy Eddy, LPN   Authorized by:   Stacie Glaze MD   Signed by:   Willy Eddy, LPN on 83/15/1761   Method used:   Print then Give to Patient   RxID:   [id number]

## 2010-12-11 NOTE — Progress Notes (Signed)
       Additional Follow-up for Phone Call Additional follow up Details #2::    called patient to alert her to error on prep sheet.  No answer on her cell phone, but left name and number to call me on her voice mail(message id'd).  Awaiting her call to change procedure time to 4pm. Follow-up by: Kashius Dominic S. Amany Rando, RN   Appended Document:  spoke with pt on 07/30/10 @ 1435.  She has understanding of Movi-prep mixing with water only.  Advised she could have all the Gatorade she would like, not red.  Pt to be here at 3pm for 4pm procedure.

## 2010-12-11 NOTE — Progress Notes (Signed)
Summary: REQ FOR REFILL ON MED  Phone Note Call from Patient   Caller: Patient [phone] Reason for Call: Refill Medication Summary of Call: Pt called in to adv that she needs a refill on med: ADDERALL XR 30 MG  .Marland Kitchen... Adv that she can be reached at [phone] when same is ready for p/u.  Initial call taken by: Debbra Riding,  December 13, 2009 12:36 PM  Follow-up for Phone Call        per out records -she had 3 scripts given to her on 11/29- but the pharmacy states the last one filled was on 12-29 and they have no more- will given her 2 refills.pt informed will be ready on am Follow-up by: Willy Eddy, LPN,  December 13, 2009 1:35 PM    Prescriptions: ADDERALL XR 30 MG  CP24 (AMPHETAMINE-DEXTROAMPHETAMINE) Take 1 tablet by mouth once a day  #30 x 0   Entered by:   Willy Eddy, LPN   Authorized by:   Stacie Glaze MD   Signed by:   Willy Eddy, LPN on 29/56/2130   Method used:   Print then Give to Patient   RxID:   [id number] ADDERALL XR 30 MG  CP24 (AMPHETAMINE-DEXTROAMPHETAMINE) Take 1 tablet by mouth once a day  #30 x 0   Entered by:   Willy Eddy, LPN   Authorized by:   Stacie Glaze MD   Signed by:   Willy Eddy, LPN on 32/44/0102   Method used:   Print then Give to Patient   RxID:   [id number]

## 2010-12-11 NOTE — Assessment & Plan Note (Signed)
Summary: headaches/dm   Vital Signs:  Patient profile:   50 year old female Height:      70 inches (177.80 cm) Weight:      241 pounds (109.55 kg) O2 Sat:      98 % on Room air Temp:     98.7 degrees F (37.06 degrees C) oral Pulse rate:   63 / minute BP sitting:   120 / 82  (right arm) Cuff size:   large  Vitals Entered By: Josph Macho RMA (May 03, 2010 8:35 AM)  O2 Flow:  Room air CC: Headaches X1 week, nausea's/ pt states she hasn't taken Lexapro in 3-4 weeks/ CF Is Patient Diabetic? No   History of Present Illness: Patient in for evaluation of worsening HA. She has a long history of headaches dating back many years and has had periods of time in the past when they became more frequent and in tense. Last fall she came in to see her PMD with the c/o increased frequency and intensity of headaches and was placed on a BP tab after her BP was found to be elevated. She does acknowledge that her BP improved and so did her HAs for awile. Unfortunately though her HAs have worsened again and she has had the current HA for over a week. It was significantly worse when it first started last Tuesday with severe facial pain/pressure/nausea/photophobia. It initially put her to bed, now she is functioning but dull, generalized pain persists.  She talks at great length about her ongoing stressors with 3 kids ( 2 teens in the home), marital and health issues. She has trouble with poor sleep, low mood. Was previously took Lexapro and did find it helpful in managing her stressors but stopped it a few months ago more for financial reasons than any other concerns and she realizes now that she probably needs something  She reports her HAs have historically been 3 different types. She gets occipital HAs she recognizes as stress related. She also gets some generalized HAs over the top of her head sometimes they are unilateral and sometimes b/l. These can sometimes have some photophobia/phonobia and n/v  assosiated. This most recent HA is the 3rd type and is over the forehead and behind the eyes and she links it to increased sinus congestion. She is using nasal saline with Menthol and generic Affrin regularly for this. She is not taking any antihistamines. She has previously been set up with Neuro to eval HA but did not go  when they improved. She admits to snoring excessively at times. Waking up occasionally with headaches and struggling with ongoing fatigue. No febrile illness/CP/palp/ SOB/GI or GU c/o.  Current Medications (verified): 1)  Errin 0.35 Mg  Tabs (Norethindrone (Contraceptive)) .... Take 1 Tablet By Mouth Once A Day 2)  Adderall Xr 30 Mg  Cp24 (Amphetamine-Dextroamphetamine) .... Take 1 Tablet By Mouth Once A Day 3)  Oscal 500/200 D-3 500-200 Mg-Unit  Tabs (Calcium-Vitamin D) .... Once Daily 4)  Adderall 5 Mg Tabs (Amphetamine-Dextroamphetamine) .... One By Mouth After Noon As Needed 5)  Lexapro 10 Mg Tabs (Escitalopram Oxalate) .Marland Kitchen.. 1 Once Daily 6)  Fluconazole 150 Mg Tabs (Fluconazole) .Marland Kitchen.. 1 Every Week(Podiastrist) 7)  Bisoprolol-Hydrochlorothiazide 10-6.25 Mg Tabs (Bisoprolol-Hydrochlorothiazide) .... One By Mouth Daily  Allergies (verified): No Known Drug Allergies  Past History:  Past medical history reviewed for relevance to current acute and chronic problems. Social history (including risk factors) reviewed for relevance to current acute and chronic problems.  Past  Medical History: Reviewed history from 09/10/2007 and no changes required. Obesity ADHD Depression  hx of  Social History: Reviewed history from 05/07/2007 and no changes required. Married Never Smoked Alcohol use-yes  Review of Systems      See HPI  Physical Exam  General:  Well-developed,well-nourished,in no acute distress; alert,appropriate and cooperative throughout examination Head:  Normocephalic and atraumatic without obvious abnormalities.  Eyes:  No corneal or conjunctival inflammation  noted. EOMI. Perrla. Funduscopic exam benign, without hemorrhages, exudates or papilledema. Vision grossly normal. Ears:  R cerumen impaction and L Cerumen impaction.   Nose:  no external deformity, mucosal erythema, and mucosal edema.   Mouth:  Oral mucosa and oropharynx without lesions or exudates.  Teeth in good repair. Neck:  No deformities, masses, or tenderness noted. Lungs:  Normal respiratory effort, chest expands symmetrically. Lungs are clear to auscultation, no crackles or wheezes. Heart:  Normal rate and regular rhythm. S1 and S2 normal without gallop, murmur, click, rub or other extra sounds. Abdomen:  Bowel sounds positive,abdomen soft and non-tender without masses, organomegaly or hernias noted. Extremities:  No clubbing, cyanosis, edema Neurologic:  No cranial nerve deficits noted. Station and gait are normal. Plantar reflexes are down-going bilaterally. DTRs are symmetrical throughout. Sensory, motor and coordinative functions appear intact. Psych:  Cognition and judgment appear intact. Alert and cooperative with normal attention span and concentration. No apparent delusions, illusions, hallucinations. Anxious   Impression & Recommendations:  Problem # 1:  HEADACHE, CLUSTER (ICD-339.00) Has variable types of HA and is offered several options. Declines rereferral to Neuro but will consider in future if not improving. For fatigue, weight gain, headaches, snoring and HTN will order a sleep study to further eval. May use Ibuprofen as needed for HA and given an rx for Bupap to try as an abortive measure due to the variable nature of her headaches. * hour sleep, small frequent meals with lean proteins, complex carbs, start exercise  Problem # 2:  SINUSITIS, CHRONIC (ICD-473.9)  Her updated medication list for this problem includes:    Zithromax 250 Mg Tabs (Azithromycin) .Marland Kitchen... 2 tabs by mouth once then 1 tab by mouth daily x 4 days    Flonase 50 Mcg/act Susp (Fluticasone propionate)  .Marland Kitchen... 2 sprays ea nostril daily as needed congestion Use Zyrtec and Flonase daily til seen again  Problem # 3:  HYPERTENSION, SYSTOLIC, BORDERLINE (ICD-401.9)  Her updated medication list for this problem includes:    Bisoprolol-hydrochlorothiazide 10-6.25 Mg Tabs (Bisoprolol-hydrochlorothiazide) ..... One by mouth daily Adequate control, no change in this therapy  Problem # 4:  OBESITY NOS (ICD-278.00) Encouraged small frequent meals, add exercise and get adequate sleep.  Problem # 5:  DEPRESSION (ICD-311)  The following medications were removed from the medication list:    Lexapro 10 Mg Tabs (Escitalopram oxalate) .Marland Kitchen... 1 once daily Her updated medication list for this problem includes:    Citalopram Hydrobromide 10 Mg Tabs (Citalopram hydrobromide) .Marland Kitchen... 1 tab by mouth daily Patient has stopped her Lexapro which is contributing to her HA. Agrees to start Citalopram  Complete Medication List: 1)  Errin 0.35 Mg Tabs (Norethindrone (contraceptive)) .... Take 1 tablet by mouth once a day 2)  Adderall Xr 30 Mg Cp24 (Amphetamine-dextroamphetamine) .... Take 1 tablet by mouth once a day 3)  Oscal 500/200 D-3 500-200 Mg-unit Tabs (Calcium-vitamin d) .... Once daily 4)  Adderall 5 Mg Tabs (Amphetamine-dextroamphetamine) .... One by mouth after noon as needed 5)  Fluconazole 150 Mg Tabs (Fluconazole) .Marland KitchenMarland KitchenMarland Kitchen 1  every week(podiastrist) 6)  Bisoprolol-hydrochlorothiazide 10-6.25 Mg Tabs (Bisoprolol-hydrochlorothiazide) .... One by mouth daily 7)  Zithromax 250 Mg Tabs (Azithromycin) .... 2 tabs by mouth once then 1 tab by mouth daily x 4 days 8)  Citalopram Hydrobromide 10 Mg Tabs (Citalopram hydrobromide) .Marland Kitchen.. 1 tab by mouth daily 9)  Bupap 50-650 Mg Tabs (Butalbital-acetaminophen) .Marland Kitchen.. 1 tab by mouth q 6 hour as needed ha 10)  Flonase 50 Mcg/act Susp (Fluticasone propionate) .... 2 sprays ea nostril daily as needed congestion 11)  Cetirizine Hcl 10 Mg Tabs (Cetirizine hcl) .Marland Kitchen.. 1 tab by mouth  daily as needed allergies  Patient Instructions: 1)  Take 800 mg of Ibuprofen (Advil, Motrin) with food every 8 hours as needed  for relief of pain or comfort of fever.  2)  Attempt 7-8 hour of sleep nightly, regular exercise is crucial but not within 2 hours of bedtime. Small frequent meals with lean proteins and complex carbs.  3)  Consider sleep study and/or neurologic referral if symptoms do not improve or worsen 4)  Please schedule a follow-up appointment in 2 months.  5)  Take your antibiotic as prescribed until ALL of it is gone, but stop if you develop a rash or swelling and contact our office as soon as possible.  6)  Acute sinusitis symptoms for less than 10 days are not helped by antibiotics. Use warm moist compresses, and over the counter decongestants( only as directed). Call if no improvement in 5-7 days, sooner if increasing pain, fever, or new symptoms.  Prescriptions: CETIRIZINE HCL 10 MG TABS (CETIRIZINE HCL) 1 tab by mouth daily as needed allergies  #30 x 2   Entered and Authorized by:   Danise Edge MD   Signed by:   Danise Edge MD on 05/03/2010   Method used:   Electronically to        [facility] # [id number]* (retail)       [address]       [place], [place]  [address]       Ph: [phone]       Fax: [phone]   RxID:   [id number] FLONASE 50 MCG/ACT SUSP (FLUTICASONE PROPIONATE) 2 sprays ea nostril daily as needed congestion  #1 x 2   Entered and Authorized by:   Danise Edge MD   Signed by:   Danise Edge MD on 05/03/2010   Method used:   Electronically to        [facility] # [id number]* (retail)       [address]       [place], [place]  [address]       Ph: [phone]       Fax: [phone]   RxID:   [id number] BUPAP 50-650 MG TABS (BUTALBITAL-ACETAMINOPHEN) 1 tab by mouth q 6 hour as needed HA  #45 x 1   Entered and Authorized by:   Danise Edge MD   Signed by:   Danise Edge MD on 05/03/2010   Method used:    Electronically to        [facility] # [id number]* (retail)       [address]       [place], [place]  [address]       Ph: [phone]       Fax: [phone]   RxID:   [id number] CITALOPRAM HYDROBROMIDE 10 MG TABS (CITALOPRAM HYDROBROMIDE) 1 tab by mouth daily  #30 x 2   Entered and Authorized by:  Danise Edge MD   Signed by:   Danise Edge MD on 05/03/2010   Method used:   Electronically to        [facility] [address] (retail)       [address]       [place], [place]  [address]       Ph: [phone]       Fax: [phone]   RxID:   [id number] ZITHROMAX 250 MG TABS (AZITHROMYCIN) 2 tabs by mouth once then 1 tab by mouth daily x 4 days  #6 x 1   Entered and Authorized by:   Danise Edge MD   Signed by:   Danise Edge MD on 05/03/2010   Method used:   Electronically to        [facility] # [phone]* (retail)       [address]       [place], [place]  [address]       Ph: [phone]       Fax: [phone]   RxID:   [id number]   Appended Document: Orders Update    Clinical Lists Changes  Problems: Added new problem of SNORING (ICD-786.09) Orders: Added new Referral order of Pulmonary Referral (Pulmonary) - Signed

## 2010-12-11 NOTE — Assessment & Plan Note (Signed)
Summary: 1 month fup//ccm   Vital Signs:  Patient profile:   50 year old female Height:      70 inches Weight:      242 pounds BMI:     34.85 Temp:     98.2 degrees F oral Pulse rate:   72 / minute Resp:     14 per minute BP sitting:   140 / 84  (left arm) Cuff size:   large  Vitals Entered By: Willy Eddy, LPN (June 14, 2010 12:02 PM)  Nutrition Counseling: Patient's BMI is greater than 25 and therefore counseled on weight management options. CC: roa-f/u after changing to verapamil180 qd, Hypertension Management Is Patient Diabetic? No   CC:  roa-f/u after changing to verapamil180 qd and Hypertension Management.  History of Present Illness: follow up ADD  Hypertension History:      She denies headache, chest pain, palpitations, dyspnea with exertion, orthopnea, PND, peripheral edema, visual symptoms, neurologic problems, syncope, and side effects from treatment.        Positive major cardiovascular risk factors include hypertension.  Negative major cardiovascular risk factors include female age less than 8 years old, negative family history for ischemic heart disease, and non-tobacco-user status.     Preventive Screening-Counseling & Management  Alcohol-Tobacco     Smoking Status: never     Passive Smoke Exposure: no  Problems Prior to Update: 1)  Symptomatic Menopausal/female Climacteric States  (ICD-627.2) 2)  Snoring  (ICD-786.09) 3)  Headache, Cluster  (ICD-339.00) 4)  Hypertension, Systolic, Borderline  (ICD-401.9) 5)  Sinusitis, Chronic  (ICD-473.9) 6)  Back Pain, Chronic, Intermittent  (ICD-724.5) 7)  Uri  (ICD-465.9) 8)  Malignant Melanoma Other Specified Sites Skin  (ICD-172.8) 9)  Hemoccult Positive Stool  (ICD-578.1) 10)  Viral Uri  (ICD-465.9) 11)  Preventive Health Care  (ICD-V70.0) 12)  Obesity Nos  (ICD-278.00) 13)  Depression  (ICD-311) 14)  Disorder, Attention Deficit w/o Hyperactivity  (ICD-314.00)  Current Problems (verified): 1)   Symptomatic Menopausal/female Climacteric States  (ICD-627.2) 2)  Snoring  (ICD-786.09) 3)  Headache, Cluster  (ICD-339.00) 4)  Hypertension, Systolic, Borderline  (ICD-401.9) 5)  Sinusitis, Chronic  (ICD-473.9) 6)  Back Pain, Chronic, Intermittent  (ICD-724.5) 7)  Uri  (ICD-465.9) 8)  Malignant Melanoma Other Specified Sites Skin  (ICD-172.8) 9)  Hemoccult Positive Stool  (ICD-578.1) 10)  Viral Uri  (ICD-465.9) 11)  Preventive Health Care  (ICD-V70.0) 12)  Obesity Nos  (ICD-278.00) 13)  Depression  (ICD-311) 14)  Disorder, Attention Deficit w/o Hyperactivity  (ICD-314.00)  Medications Prior to Update: 1)  Errin 0.35 Mg  Tabs (Norethindrone (Contraceptive)) .... Take 1 Tablet By Mouth Once A Day 2)  Adderall Xr 30 Mg  Cp24 (Amphetamine-Dextroamphetamine) .... Take 1 Tablet By Mouth Once A Day 3)  Oscal 500/200 D-3 500-200 Mg-Unit  Tabs (Calcium-Vitamin D) .... Once Daily 4)  Adderall 5 Mg Tabs (Amphetamine-Dextroamphetamine) .... One By Mouth After Noon As Needed 5)  Fluconazole 150 Mg Tabs (Fluconazole) .Marland Kitchen.. 1 Every Week(Podiastrist) 6)  Bisoprolol-Hydrochlorothiazide 10-6.25 Mg Tabs (Bisoprolol-Hydrochlorothiazide) .... One By Mouth Daily 7)  Citalopram Hydrobromide 10 Mg Tabs (Citalopram Hydrobromide) .Marland Kitchen.. 1 Tab By Mouth Daily 8)  Bupap 50-650 Mg Tabs (Butalbital-Acetaminophen) .Marland Kitchen.. 1 Tab By Mouth Q 6 Hour As Needed Ha 9)  Flonase 50 Mcg/act Susp (Fluticasone Propionate) .... 2 Sprays Ea Nostril Daily As Needed Congestion 10)  Cetirizine Hcl 10 Mg Tabs (Cetirizine Hcl) .Marland Kitchen.. 1 Tab By Mouth Daily As Needed Allergies 11)  Verapamil  Hcl Cr 180 Mg Xr24h-Cap (Verapamil Hcl) .... One By Mouth Daily  Current Medications (verified): 1)  Adderall Xr 30 Mg  Cp24 (Amphetamine-Dextroamphetamine) .... Take 1 Tablet By Mouth Once A Day 2)  Oscal 500/200 D-3 500-200 Mg-Unit  Tabs (Calcium-Vitamin D) .... Once Daily 3)  Adderall 5 Mg Tabs (Amphetamine-Dextroamphetamine) .... One By Mouth After Noon  As Needed 4)  Fluconazole 150 Mg Tabs (Fluconazole) .Marland Kitchen.. 1 Every Week(Podiastrist) 5)  Citalopram Hydrobromide 10 Mg Tabs (Citalopram Hydrobromide) .Marland Kitchen.. 1 Tab By Mouth Daily 6)  Bupap 50-650 Mg Tabs (Butalbital-Acetaminophen) .Marland Kitchen.. 1 Tab By Mouth Q 6 Hour As Needed Ha 7)  Flonase 50 Mcg/act Susp (Fluticasone Propionate) .... 2 Sprays Ea Nostril Daily As Needed Congestion 8)  Cetirizine Hcl 10 Mg Tabs (Cetirizine Hcl) .Marland Kitchen.. 1 Tab By Mouth Daily As Needed Allergies 9)  Verapamil Hcl Cr 180 Mg Xr24h-Cap (Verapamil Hcl) .... One By Mouth Daily  Allergies (verified): No Known Drug Allergies  Past History:  Family History: Last updated: 12/02/2007 father... Family History High cholesterol mother...  Family History of Stroke M 1st degree relative <50  mother had a small stroke wth normal lipid and HTN Family History Osteoporosis  Social History: Last updated: 05/07/2007 Married Never Smoked Alcohol use-yes  Risk Factors: Smoking Status: never (06/14/2010) Passive Smoke Exposure: no (06/14/2010)  Past medical, surgical, family and social histories (including risk factors) reviewed, and no changes noted (except as noted below).  Past Medical History: Reviewed history from 09/10/2007 and no changes required. Obesity ADHD Depression  hx of  Past Surgical History: Reviewed history from 05/07/2007 and no changes required. [id number] D&C x 2  Tonsillectomy Bunionectomy-2004  Family History: Reviewed history from 12/02/2007 and no changes required. father... Family History High cholesterol mother...  Family History of Stroke M 1st degree relative <50  mother had a small stroke wth normal lipid and HTN Family History Osteoporosis  Social History: Reviewed history from 05/07/2007 and no changes required. Married Never Smoked Alcohol use-yes  Review of Systems  The patient denies anorexia, fever, weight loss, weight gain, vision loss, decreased hearing, hoarseness, chest pain,  syncope, dyspnea on exertion, peripheral edema, prolonged cough, headaches, hemoptysis, abdominal pain, melena, hematochezia, severe indigestion/heartburn, hematuria, incontinence, genital sores, muscle weakness, suspicious skin lesions, transient blindness, difficulty walking, depression, unusual weight change, abnormal bleeding, enlarged lymph nodes, angioedema, and breast masses.    Physical Exam  General:  Well-developed,well-nourished,in no acute distress; alert,appropriate and cooperative throughout examination Head:  Normocephalic and atraumatic without obvious abnormalities.  Eyes:  pupils equal and pupils round.   Ears:  R cerumen impaction and L Cerumen impaction.   Nose:  no external deformity and no nasal discharge.   Neck:  No deformities, masses, or tenderness noted. Lungs:  Normal respiratory effort, chest expands symmetrically. Lungs are clear to auscultation, no crackles or wheezes. Heart:  Normal rate and regular rhythm. S1 and S2 normal without gallop, murmur, click, rub or other extra sounds. Abdomen:  Bowel sounds positive,abdomen soft and non-tender without masses, organomegaly or hernias noted.   Impression & Recommendations:  Problem # 1:  HYPERTENSION, SYSTOLIC, BORDERLINE (ICD-401.9)  The following medications were removed from the medication list:    Bisoprolol-hydrochlorothiazide 10-6.25 Mg Tabs (Bisoprolol-hydrochlorothiazide) ..... One by mouth daily Her updated medication list for this problem includes:    Verapamil Hcl Cr 240 Mg Xr24h-cap (Verapamil hcl) ..... One by mouth daily  BP today: 140/84 Prior BP: 140/80 (05/16/2010)  Prior 10 Yr Risk Heart Disease: 6 % (05/16/2010)  Labs Reviewed: K+: 4.7 (09/01/2009) Creat: : 0.8 (09/01/2009)   Chol: 195 (09/01/2009)   HDL: 58.40 (09/01/2009)   LDL: 125 (09/01/2009)   TG: 57.0 (09/01/2009)  Problem # 2:  HEADACHE, CLUSTER (ICD-339.00) some improvement in cluster head aches  Complete Medication List: 1)   Adderall Xr 30 Mg Cp24 (Amphetamine-dextroamphetamine) .... Take 1 tablet by mouth once a day 2)  Oscal 500/200 D-3 500-200 Mg-unit Tabs (Calcium-vitamin d) .... Once daily 3)  Adderall 5 Mg Tabs (Amphetamine-dextroamphetamine) .... One by mouth after noon as needed 4)  Fluconazole 150 Mg Tabs (Fluconazole) .Marland Kitchen.. 1 every week(podiastrist) 5)  Citalopram Hydrobromide 10 Mg Tabs (Citalopram hydrobromide) .Marland Kitchen.. 1 tab by mouth daily 6)  Bupap 50-650 Mg Tabs (Butalbital-acetaminophen) .Marland Kitchen.. 1 tab by mouth q 6 hour as needed ha 7)  Flonase 50 Mcg/act Susp (Fluticasone propionate) .... 2 sprays ea nostril daily as needed congestion 8)  Cetirizine Hcl 10 Mg Tabs (Cetirizine hcl) .Marland Kitchen.. 1 tab by mouth daily as needed allergies 9)  Verapamil Hcl Cr 240 Mg Xr24h-cap (Verapamil hcl) .... One by mouth daily  Hypertension Assessment/Plan:      The patient's hypertensive risk group is category A: No risk factors and no target organ damage.  Her calculated 10 year risk of coronary heart disease is 6 %.  Today's blood pressure is 140/84.  Her blood pressure goal is < 140/90.  Patient Instructions: 1)  Please schedule a follow-up appointment in 2 months. Prescriptions: VERAPAMIL HCL CR 240 MG XR24H-CAP (VERAPAMIL HCL) one by mouth daily  #30 x 11   Entered and Authorized by:   Stacie Glaze MD   Signed by:   Stacie Glaze MD on 06/14/2010   Method used:   Electronically to        [facility] # [phone]* (retail)       [address]       [place], [place]  [address]       Ph: [phone]       Fax: [phone]   RxID:   [id number] VERAPAMIL HCL CR 240 MG XR24H-CAP (VERAPAMIL HCL) one by mouth daily  #30 x 11   Entered and Authorized by:   Stacie Glaze MD   Signed by:   Stacie Glaze MD on 06/14/2010   Method used:   Electronically to        [facility]* (retail)       [address]       [place], [place]  [id number]       Ph: [phone]       Fax: [phone]    RxID:   [id number]

## 2010-12-11 NOTE — Assessment & Plan Note (Signed)
Summary: 3 MONTH//CCM---PT [facility] // RS   Vital Signs:  Patient profile:   50 year old female Height:      70 inches Weight:      236 pounds BMI:     33.98 Temp:     98.2 degrees F oral Pulse rate:   76 / minute Resp:     14 per minute BP sitting:   130 / 80  (left arm)  Vitals Entered By: Willy Eddy, LPN (January 17, 1609 2:44 PM) CC: roa headaches   CC:  roa headaches.  History of Present Illness: weigth loss has not been going to the gym but has been walking still having head aches more in the afternoon has even awoke in the night advil helps ( 800 mg) TOP OF HEAD  no neurological sympoms associated ( no visual changes , not weakness)   Preventive Screening-Counseling & Management  Alcohol-Tobacco     Smoking Status: never     Passive Smoke Exposure: no  Problems Prior to Update: 1)  Hypertension, Systolic, Borderline  (ICD-401.9) 2)  Sinusitis, Chronic  (ICD-473.9) 3)  Back Pain, Chronic, Intermittent  (ICD-724.5) 4)  Uri  (ICD-465.9) 5)  Malignant Melanoma Other Specified Sites Skin  (ICD-172.8) 6)  Hemoccult Positive Stool  (ICD-578.1) 7)  Viral Uri  (ICD-465.9) 8)  Preventive Health Care  (ICD-V70.0) 9)  Obesity Nos  (ICD-278.00) 10)  Depression  (ICD-311) 11)  Disorder, Attention Deficit w/o Hyperactivity  (ICD-314.00)  Current Problems (verified): 1)  Hypertension, Systolic, Borderline  (ICD-401.9) 2)  Sinusitis, Chronic  (ICD-473.9) 3)  Back Pain, Chronic, Intermittent  (ICD-724.5) 4)  Uri  (ICD-465.9) 5)  Malignant Melanoma Other Specified Sites Skin  (ICD-172.8) 6)  Hemoccult Positive Stool  (ICD-578.1) 7)  Viral Uri  (ICD-465.9) 8)  Preventive Health Care  (ICD-V70.0) 9)  Obesity Nos  (ICD-278.00) 10)  Depression  (ICD-311) 11)  Disorder, Attention Deficit w/o Hyperactivity  (ICD-314.00)  Medications Prior to Update: 1)  Errin 0.35 Mg  Tabs (Norethindrone (Contraceptive)) .... Take 1 Tablet By Mouth Once A Day 2)  Adderall Xr 30 Mg  Cp24  (Amphetamine-Dextroamphetamine) .... Take 1 Tablet By Mouth Once A Day 3)  Oscal 500/200 D-3 500-200 Mg-Unit  Tabs (Calcium-Vitamin D) .... Once Daily 4)  Adderall 5 Mg Tabs (Amphetamine-Dextroamphetamine) .... One By Mouth After Noon As Needed 5)  Lexapro 10 Mg Tabs (Escitalopram Oxalate) .Marland Kitchen.. 1 Once Daily 6)  Fluconazole 150 Mg Tabs (Fluconazole) .Marland Kitchen.. 1 Every Week(Podiastrist) 7)  Bisoprolol-Hydrochlorothiazide 5-6.25 Mg Tabs (Bisoprolol-Hydrochlorothiazide) .... One By Mouth Daily ( Generic Drug) 8)  Smz-Tmp Ds 800-160 Mg Tabs (Sulfamethoxazole-Trimethoprim) .... One By Mouth Two Times A Day For 10 Days 9)  Respivent-D 120-2.5 Mg Xr12h-Tab (Pseudoephedrine-Methscopolamin) .... One By Mouth Two Times A Day ( May Sub)  Current Medications (verified): 1)  Errin 0.35 Mg  Tabs (Norethindrone (Contraceptive)) .... Take 1 Tablet By Mouth Once A Day 2)  Adderall Xr 30 Mg  Cp24 (Amphetamine-Dextroamphetamine) .... Take 1 Tablet By Mouth Once A Day 3)  Oscal 500/200 D-3 500-200 Mg-Unit  Tabs (Calcium-Vitamin D) .... Once Daily 4)  Adderall 5 Mg Tabs (Amphetamine-Dextroamphetamine) .... One By Mouth After Noon As Needed 5)  Lexapro 10 Mg Tabs (Escitalopram Oxalate) .Marland Kitchen.. 1 Once Daily 6)  Fluconazole 150 Mg Tabs (Fluconazole) .Marland Kitchen.. 1 Every Week(Podiastrist) 7)  Bisoprolol-Hydrochlorothiazide 10-6.25 Mg Tabs (Bisoprolol-Hydrochlorothiazide) .... One By Mouth Daily  Allergies (verified): No Known Drug Allergies  Past History:  Family History: Last updated: 12/02/2007  father... Family History High cholesterol mother...  Family History of Stroke M 1st degree relative <50  mother had a small stroke wth normal lipid and HTN Family History Osteoporosis  Social History: Last updated: 05/07/2007 Married Never Smoked Alcohol use-yes  Risk Factors: Smoking Status: never (01/17/2010) Passive Smoke Exposure: no (01/17/2010)  Past medical, surgical, family and social histories (including risk  factors) reviewed, and no changes noted (except as noted below).  Past Medical History: Reviewed history from 09/10/2007 and no changes required. Obesity ADHD Depression  hx of  Past Surgical History: Reviewed history from 05/07/2007 and no changes required. [id number] D&C x 2  Tonsillectomy Bunionectomy-2004  Family History: Reviewed history from 12/02/2007 and no changes required. father... Family History High cholesterol mother...  Family History of Stroke M 1st degree relative <50  mother had a small stroke wth normal lipid and HTN Family History Osteoporosis  Social History: Reviewed history from 05/07/2007 and no changes required. Married Never Smoked Alcohol use-yes  Review of Systems  The patient denies anorexia, fever, weight loss, weight gain, vision loss, decreased hearing, hoarseness, chest pain, syncope, dyspnea on exertion, peripheral edema, prolonged cough, headaches, hemoptysis, abdominal pain, melena, hematochezia, severe indigestion/heartburn, hematuria, incontinence, genital sores, muscle weakness, suspicious skin lesions, transient blindness, difficulty walking, depression, unusual weight change, abnormal bleeding, enlarged lymph nodes, angioedema, and breast masses.    Physical Exam  General:  alert and overweight-appearing.   Head:  normocephalic and atraumatic.   Ears:  R ear normal and L ear normal.   Nose:  no external deformity and no nasal discharge.   Neck:  No deformities, masses, or tenderness noted. Lungs:  normal respiratory effort and no wheezes.   Heart:  normal rate and regular rhythm.   Abdomen:  Bowel sounds positive,abdomen soft and non-tender without masses, organomegaly or hernias noted.   Impression & Recommendations:  Problem # 1:  HEADACHE, CLUSTER (ICD-339.00) increase the bisoprolol to 10 and moniter these HEADACHE  Problem # 2:  DISORDER, ATTENTION DEFICIT W/O HYPERACTIVITY (ICD-314.00) stable  Problem # 3:  OBESITY NOS  (ICD-278.00) weight loss goals set Ht: 70 (01/17/2010)   Wt: 236 (01/17/2010)   BMI: 33.98 (01/17/2010)  Complete Medication List: 1)  Errin 0.35 Mg Tabs (Norethindrone (contraceptive)) .... Take 1 tablet by mouth once a day 2)  Adderall Xr 30 Mg Cp24 (Amphetamine-dextroamphetamine) .... Take 1 tablet by mouth once a day 3)  Oscal 500/200 D-3 500-200 Mg-unit Tabs (Calcium-vitamin d) .... Once daily 4)  Adderall 5 Mg Tabs (Amphetamine-dextroamphetamine) .... One by mouth after noon as needed 5)  Lexapro 10 Mg Tabs (Escitalopram oxalate) .Marland Kitchen.. 1 once daily 6)  Fluconazole 150 Mg Tabs (Fluconazole) .Marland Kitchen.. 1 every week(podiastrist) 7)  Bisoprolol-hydrochlorothiazide 10-6.25 Mg Tabs (Bisoprolol-hydrochlorothiazide) .... One by mouth daily  Patient Instructions: 1)  Please schedule a follow-up appointment in 3 months. Prescriptions: BISOPROLOL-HYDROCHLOROTHIAZIDE 10-6.25 MG TABS (BISOPROLOL-HYDROCHLOROTHIAZIDE) one by mouth daily  #30 x 11   Entered and Authorized by:   Stacie Glaze MD   Signed by:   Stacie Glaze MD on 01/17/2010   Method used:   Electronically to        [facility] # [address] (retail)       [address]       [place], [place]  [address]       Ph: [phone]       Fax: [phone]   RxID:   [id number] BISOPROLOL-HYDROCHLOROTHIAZIDE 10-6.25 MG TABS (BISOPROLOL-HYDROCHLOROTHIAZIDE) one by mouth daily  #30  x 11   Entered and Authorized by:   Stacie Glaze MD   Signed by:   Stacie Glaze MD on 01/17/2010   Method used:   Print then Give to Patient   RxID:   [id number] ADDERALL 5 MG TABS (AMPHETAMINE-DEXTROAMPHETAMINE) one by mouth after noon as needed  #30 x 0   Entered by:   Willy Eddy, LPN   Authorized by:   Stacie Glaze MD   Signed by:   Willy Eddy, LPN on 56/21/3086   Method used:   Print then Give to Patient   RxID:   [id number] ADDERALL XR 30 MG  CP24 (AMPHETAMINE-DEXTROAMPHETAMINE) Take 1 tablet by mouth once a  day  #30 x 0   Entered by:   Willy Eddy, LPN   Authorized by:   Stacie Glaze MD   Signed by:   Willy Eddy, LPN on 44/11/270   Method used:   Print then Give to Patient   RxID:   [id number] ADDERALL 5 MG TABS (AMPHETAMINE-DEXTROAMPHETAMINE) one by mouth after noon as needed  #30 x 0   Entered by:   Willy Eddy, LPN   Authorized by:   Stacie Glaze MD   Signed by:   Willy Eddy, LPN on 38/75/6433   Method used:   Print then Give to Patient   RxID:   [id number] ADDERALL XR 30 MG  CP24 (AMPHETAMINE-DEXTROAMPHETAMINE) Take 1 tablet by mouth once a day  #30 x 0   Entered by:   Willy Eddy, LPN   Authorized by:   Stacie Glaze MD   Signed by:   Willy Eddy, LPN on 11/19/3233   Method used:   Print then Give to Patient   RxID:   [id number] ADDERALL XR 30 MG  CP24 (AMPHETAMINE-DEXTROAMPHETAMINE) Take 1 tablet by mouth once a day  #30 x 0   Entered by:   Willy Eddy, LPN   Authorized by:   Stacie Glaze MD   Signed by:   Willy Eddy, LPN on 62/83/1517   Method used:   Print then Give to Patient   RxID:   [id number] ADDERALL 5 MG TABS (AMPHETAMINE-DEXTROAMPHETAMINE) one by mouth after noon as needed  #30 x 0   Entered by:   Willy Eddy, LPN   Authorized by:   Stacie Glaze MD   Signed by:   Willy Eddy, LPN on 46/27/0350   Method used:   Print then Give to Patient   RxID:   [id number]

## 2010-12-11 NOTE — Progress Notes (Signed)
Summary: Pt still having severe headaches.   LMTCB for appt with Dr. Binnie Kand  Phone Note Call from Patient Call back at [phone] cell   Caller: Patient Reason for Call: Acute Illness Summary of Call: Pt says headaches have come back again and are pretty severe.  Sometimes pt feels dizzy and nauseous. Pt is wondering if there is anything she can  take for this? Pt is currently taking Ibuprofen and tylenol.  Initial call taken by: Lucy Antigua,  May 02, 2010 12:02 PM  Follow-up for Phone Call        maybe a good candidate for dr blyth Follow-up by: Willy Eddy, LPN,  May 02, 2010 12:04 PM  Additional Follow-up for Phone Call Additional follow up Details #1::        LMTCB for appt with Dr. Abner Greenspan tomorrow. Additional Follow-up by: Lynann Beaver CMA,  May 02, 2010 12:12 PM    Additional Follow-up for Phone Call Additional follow up Details #2::    Scheduled appt. Follow-up by: Lynann Beaver CMA,  May 02, 2010 3:15 PM

## 2010-12-11 NOTE — Progress Notes (Signed)
Summary: foot mass  Phone Note Call from Patient Call back at Home Phone [phone]   Caller: Patient Call For: Stacie Glaze MD Summary of Call: Pt continues to have the mass on the top of her foot that Dr. Lovell Sheehan looked at on her last office visit.  No pain. or discoloration right now.  Initial call taken by: Lynann Beaver CMA,  July 09, 2010 4:20 PM  Follow-up for Phone Call        per dr Lovell Sheehan- send to podatrist- pt wi ll make own apopintment Follow-up by: Willy Eddy, LPN,  July 09, 2010 5:38 PM

## 2010-12-11 NOTE — Assessment & Plan Note (Signed)
Summary: 2 month fup//ccm pt rsc/njr   Vital Signs:  Patient profile:   50 year old female Height:      70 inches Weight:      242 pounds BMI:     34.85 Temp:     98.2 degrees F oral Pulse rate:   72 / minute Resp:     14 per minute BP sitting:   140 / 80  (left arm)  Vitals Entered By: Willy Eddy, LPN (August 28, 2010 9:57 AM) CC: roa, Hypertension Management Is Patient Diabetic? No   Primary Care Provider:  Stacie Glaze MD  CC:  roa and Hypertension Management.  History of Present Illness: had screening colon and no polyps detected  follow up ADD blood pressure stable but not as well as with the combinations medications she had pins from the bunion surgery 7 years ago that were "pushing out"   Hypertension History:      She denies headache, chest pain, palpitations, dyspnea with exertion, orthopnea, PND, peripheral edema, visual symptoms, neurologic problems, syncope, and side effects from treatment.        Positive major cardiovascular risk factors include hypertension.  Negative major cardiovascular risk factors include female age less than 29 years old, negative family history for ischemic heart disease, and non-tobacco-user status.     Preventive Screening-Counseling & Management  Alcohol-Tobacco     Smoking Status: never     Passive Smoke Exposure: no     Tobacco Counseling: not indicated; no tobacco use  Problems Prior to Update: 1)  Symptomatic Menopausal/female Climacteric States  (ICD-627.2) 2)  Snoring  (ICD-786.09) 3)  Headache, Cluster  (ICD-339.00) 4)  Hypertension, Systolic, Borderline  (ICD-401.9) 5)  Sinusitis, Chronic  (ICD-473.9) 6)  Back Pain, Chronic, Intermittent  (ICD-724.5) 7)  Uri  (ICD-465.9) 8)  Malignant Melanoma Other Specified Sites Skin  (ICD-172.8) 9)  Hemoccult Positive Stool  (ICD-578.1) 10)  Viral Uri  (ICD-465.9) 11)  Preventive Health Care  (ICD-V70.0) 12)  Obesity Nos  (ICD-278.00) 13)  Depression  (ICD-311) 14)   Disorder, Attention Deficit w/o Hyperactivity  (ICD-314.00)  Current Problems (verified): 1)  Symptomatic Menopausal/female Climacteric States  (ICD-627.2) 2)  Snoring  (ICD-786.09) 3)  Headache, Cluster  (ICD-339.00) 4)  Hypertension, Systolic, Borderline  (ICD-401.9) 5)  Sinusitis, Chronic  (ICD-473.9) 6)  Back Pain, Chronic, Intermittent  (ICD-724.5) 7)  Uri  (ICD-465.9) 8)  Malignant Melanoma Other Specified Sites Skin  (ICD-172.8) 9)  Hemoccult Positive Stool  (ICD-578.1) 10)  Viral Uri  (ICD-465.9) 11)  Preventive Health Care  (ICD-V70.0) 12)  Obesity Nos  (ICD-278.00) 13)  Depression  (ICD-311) 14)  Disorder, Attention Deficit w/o Hyperactivity  (ICD-314.00)  Medications Prior to Update: 1)  Adderall Xr 30 Mg  Cp24 (Amphetamine-Dextroamphetamine) .... Take 1 Tablet By Mouth Once A Day 2)  Oscal 500/200 D-3 500-200 Mg-Unit  Tabs (Calcium-Vitamin D) .... Once Daily 3)  Adderall 5 Mg Tabs (Amphetamine-Dextroamphetamine) .... One By Mouth After Noon As Needed 4)  Fluconazole 150 Mg Tabs (Fluconazole) .Marland Kitchen.. 1 Every Week(Podiastrist) 5)  Citalopram Hydrobromide 10 Mg Tabs (Citalopram Hydrobromide) .Marland Kitchen.. 1 Tab By Mouth Daily 6)  Bupap 50-650 Mg Tabs (Butalbital-Acetaminophen) .Marland Kitchen.. 1 Tab By Mouth Q 6 Hour As Needed Ha 7)  Flonase 50 Mcg/act Susp (Fluticasone Propionate) .... 2 Sprays Ea Nostril Daily As Needed Congestion 8)  Cetirizine Hcl 10 Mg Tabs (Cetirizine Hcl) .Marland Kitchen.. 1 Tab By Mouth Daily As Needed Allergies 9)  Verapamil Hcl Cr 240 Mg Xr24h-Cap (  Verapamil Hcl) .... One By Mouth Daily  Current Medications (verified): 1)  Adderall Xr 30 Mg  Cp24 (Amphetamine-Dextroamphetamine) .... Take 1 Tablet By Mouth Once A Day 2)  Oscal 500/200 D-3 500-200 Mg-Unit  Tabs (Calcium-Vitamin D) .... Once Daily 3)  Adderall 5 Mg Tabs (Amphetamine-Dextroamphetamine) .... One By Mouth After Noon As Needed 4)  Citalopram Hydrobromide 10 Mg Tabs (Citalopram Hydrobromide) .Marland Kitchen.. 1 Tab By Mouth Daily 5)   Bupap 50-650 Mg Tabs (Butalbital-Acetaminophen) .Marland Kitchen.. 1 Tab By Mouth Q 6 Hour As Needed Ha 6)  Flonase 50 Mcg/act Susp (Fluticasone Propionate) .... 2 Sprays Ea Nostril Daily As Needed Congestion 7)  Cetirizine Hcl 10 Mg Tabs (Cetirizine Hcl) .Marland Kitchen.. 1 Tab By Mouth Daily As Needed Allergies 8)  Verapamil Hcl Cr 240 Mg Xr24h-Cap (Verapamil Hcl) .... One By Mouth Daily  Allergies (verified): No Known Drug Allergies  Past History:  Family History: Last updated: 12/02/2007 father... Family History High cholesterol mother...  Family History of Stroke M 1st degree relative <50  mother had a small stroke wth normal lipid and HTN Family History Osteoporosis  Social History: Last updated: 05/07/2007 Married Never Smoked Alcohol use-yes  Risk Factors: Smoking Status: never (08/28/2010) Passive Smoke Exposure: no (08/28/2010)  Past medical, surgical, family and social histories (including risk factors) reviewed, and no changes noted (except as noted below).  Past Medical History: Reviewed history from 09/10/2007 and no changes required. Obesity ADHD Depression  hx of  Past Surgical History: Reviewed history from 05/07/2007 and no changes required. [id number] D&C x 2  Tonsillectomy Bunionectomy-2004  Family History: Reviewed history from 12/02/2007 and no changes required. father... Family History High cholesterol mother...  Family History of Stroke M 1st degree relative <50  mother had a small stroke wth normal lipid and HTN Family History Osteoporosis  Social History: Reviewed history from 05/07/2007 and no changes required. Married Never Smoked Alcohol use-yes  Review of Systems       Flu Vaccine Consent Questions     Do you have a history of severe allergic reactions to this vaccine? no    Any prior history of allergic reactions to egg and/or gelatin? no    Do you have a sensitivity to the preservative Thimersol? no    Do you have a past history of Guillan-Barre  Syndrome? no    Do you currently have an acute febrile illness? no    Have you ever had a severe reaction to latex? no    Vaccine information given and explained to patient? yes    Are you currently pregnant? no    Lot Number:[id number]   Exp Date:05/11/2011   Site Given  Left Deltoid IM   Physical Exam  General:  Well-developed,well-nourished,in no acute distress; alert,appropriate and cooperative throughout examination Head:  Normocephalic and atraumatic without obvious abnormalities.  Eyes:  pupils equal and pupils round.   Ears:  R cerumen impaction and L Cerumen impaction.   Nose:  no external deformity and no nasal discharge.   Mouth:  Oral mucosa and oropharynx without lesions or exudates.  Teeth in good repair. Neck:  No deformities, masses, or tenderness noted. Lungs:  Normal respiratory effort, chest expands symmetrically. Lungs are clear to auscultation, no crackles or wheezes. Heart:  Normal rate and regular rhythm. S1 and S2 normal without gallop, murmur, click, rub or other extra sounds.   Impression & Recommendations:  Problem # 1:  HYPERTENSION, SYSTOLIC, BORDERLINE (ICD-401.9) Assessment Unchanged add HTCZ Her updated medication list for this problem includes:  Verapamil Hcl Cr 240 Mg Xr24h-cap (Verapamil hcl) ..... One by mouth daily    Hydrochlorothiazide 12.5 Mg Caps (Hydrochlorothiazide) ..... One by mouth daily ( added to the verapmil  BP today: 140/80 Prior BP: 140/84 (06/14/2010)  10 Yr Risk Heart Disease: 9 % Prior 10 Yr Risk Heart Disease: 6 % (05/16/2010)  Labs Reviewed: K+: 4.7 (09/01/2009) Creat: : 0.8 (09/01/2009)   Chol: 195 (09/01/2009)   HDL: 58.40 (09/01/2009)   LDL: 125 (09/01/2009)   TG: 57.0 (09/01/2009)  Problem # 2:  HEADACHE, CLUSTER (ICD-339.00) on the verapamil controlled  Problem # 3:  DISORDER, ATTENTION DEFICIT W/O HYPERACTIVITY (ICD-314.00) the pt is on mainatinance  Complete Medication List: 1)  Adderall Xr 30 Mg Cp24  (Amphetamine-dextroamphetamine) .... Take 1 tablet by mouth once a day 2)  Oscal 500/200 D-3 500-200 Mg-unit Tabs (Calcium-vitamin d) .... Once daily 3)  Adderall 5 Mg Tabs (Amphetamine-dextroamphetamine) .... One by mouth after noon as needed 4)  Citalopram Hydrobromide 10 Mg Tabs (Citalopram hydrobromide) .Marland Kitchen.. 1 tab by mouth daily 5)  Bupap 50-650 Mg Tabs (Butalbital-acetaminophen) .Marland Kitchen.. 1 tab by mouth q 6 hour as needed ha 6)  Flonase 50 Mcg/act Susp (Fluticasone propionate) .... 2 sprays ea nostril daily as needed congestion 7)  Cetirizine Hcl 10 Mg Tabs (Cetirizine hcl) .Marland Kitchen.. 1 tab by mouth daily as needed allergies 8)  Verapamil Hcl Cr 240 Mg Xr24h-cap (Verapamil hcl) .... One by mouth daily 9)  Hydrochlorothiazide 12.5 Mg Caps (Hydrochlorothiazide) .... One by mouth daily ( added to the verapmil  Other Orders: Admin 1st Vaccine (16109) Flu Vaccine 50yrs + (60454)  Hypertension Assessment/Plan:      The patient's hypertensive risk group is category A: No risk factors and no target organ damage.  Her calculated 10 year risk of coronary heart disease is 9 %.  Today's blood pressure is 140/80.  Her blood pressure goal is < 140/90.  Patient Instructions: 1)  Please schedule a follow-up appointment in 3 months. Prescriptions: HYDROCHLOROTHIAZIDE 12.5 MG CAPS (HYDROCHLOROTHIAZIDE) one by mouth daily ( added to the verapmil  #30 x 11   Entered and Authorized by:   Stacie Glaze MD   Signed by:   Stacie Glaze MD on 08/28/2010   Method used:   Electronically to        [facility] [id number] (retail)       [address]       [place], [place]  [address]       Ph: [phone]       Fax: [phone]   RxID:   [id number] ADDERALL 5 MG TABS (AMPHETAMINE-DEXTROAMPHETAMINE) one by mouth after noon as needed  #30 x 0   Entered by:   Willy Eddy, LPN   Authorized by:   Stacie Glaze MD   Signed by:   Willy Eddy, LPN on 27/25/3664   Method used:   Print then  Give to Patient   RxID:   [id number] ADDERALL 5 MG TABS (AMPHETAMINE-DEXTROAMPHETAMINE) one by mouth after noon as needed  #30 x 0   Entered by:   Willy Eddy, LPN   Authorized by:   Stacie Glaze MD   Signed by:   Willy Eddy, LPN on 43/32/9518   Method used:   Print then Give to Patient   RxID:   [id number] ADDERALL 5 MG TABS (AMPHETAMINE-DEXTROAMPHETAMINE) one by mouth after noon as needed  #30 x 0   Entered by:   Rushie Goltz  Artelia Laroche, LPN   Authorized by:   Stacie Glaze MD   Signed by:   Willy Eddy, LPN on 16/08/9603   Method used:   Print then Give to Patient   RxID:   [id number] ADDERALL XR 30 MG  CP24 (AMPHETAMINE-DEXTROAMPHETAMINE) Take 1 tablet by mouth once a day  #30 x 0   Entered by:   Willy Eddy, LPN   Authorized by:   Stacie Glaze MD   Signed by:   Willy Eddy, LPN on 21/30/8657   Method used:   Print then Give to Patient   RxID:   [id number] ADDERALL XR 30 MG  CP24 (AMPHETAMINE-DEXTROAMPHETAMINE) Take 1 tablet by mouth once a day  #30 x 0   Entered by:   Willy Eddy, LPN   Authorized by:   Stacie Glaze MD   Signed by:   Willy Eddy, LPN on 11/13/7251   Method used:   Print then Give to Patient   RxID:   [id number] ADDERALL XR 30 MG  CP24 (AMPHETAMINE-DEXTROAMPHETAMINE) Take 1 tablet by mouth once a day  #30 x 0   Entered by:   Willy Eddy, LPN   Authorized by:   Stacie Glaze MD   Signed by:   Willy Eddy, LPN on 75/64/3329   Method used:   Print then Give to Patient   RxID:   [id number]    Orders Added: 1)  Admin 1st Vaccine [90471] 2)  Flu Vaccine 28yrs + [09323] 3)  Est. Patient Level IV [55732]

## 2010-12-11 NOTE — Letter (Signed)
Summary: Pre Visit Letter Revised  [facility]  [address] [place], [place] [address]   Phone: [phone]  Fax: [phone]        07/18/2010 MRN: [id number] Misty Benson [address] [place], [place]  [address]             Procedure Date:  08/06/2010   Welcome to the Gastroenterology Division at [facility].    You are scheduled to see a nurse for your pre-procedure visit on 07/23/2010 at 8:30AM on the 3rd floor at [facility], [address]  We ask that you try to arrive at our office 15 minutes prior to your appointment time to allow for check-in.  Please take a minute to review the attached form.  If you answer "Yes" to one or more of the questions on the first page, we ask that you call the person listed at your earliest opportunity.  If you answer "No" to all of the questions, please complete the rest of the form and bring it to your appointment.    Your nurse visit will consist of discussing your medical and surgical history, your immediate family medical history, and your medications.   If you are unable to list all of your medications on the form, please bring the medication bottles to your appointment and we will list them.  We will need to be aware of both prescribed and over the counter drugs.  We will need to know exact dosage information as well.    Please be prepared to read and sign documents such as consent forms, a financial agreement, and acknowledgement forms.  If necessary, and with your consent, a friend or relative is welcome to sit-in on the nurse visit with you.  Please bring your insurance card so that we may make a copy of it.  If your insurance requires a referral to see a specialist, please bring your referral form from your primary care physician.  No co-pay is required for this nurse visit.     If you cannot keep your appointment, please call [phone] to cancel or reschedule prior to your appointment date.  This  allows [place] the opportunity to schedule an appointment for another patient in need of care.    Thank you for choosing  Gastroenterology for your medical needs.  We appreciate the opportunity to care for you.  Please visit [place] at our website  to learn more about our practice.  Sincerely, The Gastroenterology Division

## 2010-12-11 NOTE — Assessment & Plan Note (Signed)
Summary: headaches//ccm   Vital Signs:  Patient profile:   50 year old female Height:      70 inches Weight:      240 pounds BMI:     34.56 Temp:     98.2 degrees F oral Pulse rate:   76 / minute Resp:     14 per minute BP sitting:   150 / 80  (left arm)  Vitals Entered By: Willy Eddy, LPN (November 29, 2009 9:43 AM) CC: c/o constant headache   CC:  c/o constant headache.  History of Present Illness: Head aches had a sinus infection  in Nov and this helped in [place] over the holidays felt well ( except for a slight GI discomfort) in Jan the HA has resumed the head ache is "band like" increased with activity throbs and occurs latter during the day mild dizzy/nausea increased sinus conjection took clatitin d 12 hours  but stopped this a week ago using the aftrin to help sleep   Preventive Screening-Counseling & Management  Alcohol-Tobacco     Smoking Status: never     Passive Smoke Exposure: no  Problems Prior to Update: 1)  Hypertension, Systolic, Borderline  (ICD-401.9) 2)  Sinusitis, Chronic  (ICD-473.9) 3)  Back Pain, Chronic, Intermittent  (ICD-724.5) 4)  Uri  (ICD-465.9) 5)  Malignant Melanoma Other Specified Sites Skin  (ICD-172.8) 6)  Hemoccult Positive Stool  (ICD-578.1) 7)  Viral Uri  (ICD-465.9) 8)  Preventive Health Care  (ICD-V70.0) 9)  Obesity Nos  (ICD-278.00) 10)  Depression  (ICD-311) 11)  Disorder, Attention Deficit w/o Hyperactivity  (ICD-314.00)  Medications Prior to Update: 1)  Errin 0.35 Mg  Tabs (Norethindrone (Contraceptive)) .... Take 1 Tablet By Mouth Once A Day 2)  Adderall Xr 30 Mg  Cp24 (Amphetamine-Dextroamphetamine) .... Take 1 Tablet By Mouth Once A Day 3)  Oscal 500/200 D-3 500-200 Mg-Unit  Tabs (Calcium-Vitamin D) .... Once Daily 4)  Adderall 5 Mg Tabs (Amphetamine-Dextroamphetamine) .... One By Mouth After Noon As Needed 5)  Allerx Dose Pack  Misc (Pse-Methscop & Cpm-Pe-Methscop) .... One By Mouth Two Times A Day 6)   Zithromax 250 Mg Tabs (Azithromycin) .... Two By Mouth Now and Then One By Mouth Daily For 4 Days 7)  Lexapro 10 Mg Tabs (Escitalopram Oxalate) .Marland Kitchen.. 1 Once Daily  Current Medications (verified): 1)  Errin 0.35 Mg  Tabs (Norethindrone (Contraceptive)) .... Take 1 Tablet By Mouth Once A Day 2)  Adderall Xr 30 Mg  Cp24 (Amphetamine-Dextroamphetamine) .... Take 1 Tablet By Mouth Once A Day 3)  Oscal 500/200 D-3 500-200 Mg-Unit  Tabs (Calcium-Vitamin D) .... Once Daily 4)  Adderall 5 Mg Tabs (Amphetamine-Dextroamphetamine) .... One By Mouth After Noon As Needed 5)  Lexapro 10 Mg Tabs (Escitalopram Oxalate) .Marland Kitchen.. 1 Once Daily 6)  Fluconazole 150 Mg Tabs (Fluconazole) .Marland Kitchen.. 1 Every Week(Podiastrist) 7)  Bisoprolol-Hydrochlorothiazide 5-6.25 Mg Tabs (Bisoprolol-Hydrochlorothiazide) .... One By Mouth Daily ( Generic Drug) 8)  Smz-Tmp Ds 800-160 Mg Tabs (Sulfamethoxazole-Trimethoprim) .... One By Mouth Two Times A Day For 10 Days 9)  Respivent-D 120-2.5 Mg Xr12h-Tab (Pseudoephedrine-Methscopolamin) .... One By Mouth Two Times A Day ( May Sub)  Allergies (verified): No Known Drug Allergies  Past History:  Family History: Last updated: 12/02/2007 father... Family History High cholesterol mother...  Family History of Stroke M 1st degree relative <50  mother had a small stroke wth normal lipid and HTN Family History Osteoporosis  Social History: Last updated: 05/07/2007 Married Never Smoked Alcohol use-yes  Risk  Factors: Smoking Status: never (11/29/2009) Passive Smoke Exposure: no (11/29/2009)  Past medical, surgical, family and social histories (including risk factors) reviewed for relevance to current acute and chronic problems.  Past Medical History: Reviewed history from 09/10/2007 and no changes required. Obesity ADHD Depression  hx of  Past Surgical History: Reviewed history from 05/07/2007 and no changes required. [id number] D&C x 2  Tonsillectomy Bunionectomy-2004  Family  History: Reviewed history from 12/02/2007 and no changes required. father... Family History High cholesterol mother...  Family History of Stroke M 1st degree relative <50  mother had a small stroke wth normal lipid and HTN Family History Osteoporosis  Social History: Reviewed history from 05/07/2007 and no changes required. Married Never Smoked Alcohol use-yes  Review of Systems       The patient complains of headaches.  The patient denies anorexia, fever, weight loss, weight gain, vision loss, decreased hearing, hoarseness, chest pain, syncope, dyspnea on exertion, peripheral edema, prolonged cough, hemoptysis, abdominal pain, melena, hematochezia, severe indigestion/heartburn, hematuria, incontinence, genital sores, muscle weakness, suspicious skin lesions, transient blindness, difficulty walking, depression, unusual weight change, abnormal bleeding, enlarged lymph nodes, angioedema, and breast masses.    Physical Exam  General:  alert and overweight-appearing.   Eyes:  pupils equal and pupils round.  pupils reactive to light.   Nose:  no external deformity and no nasal discharge.   Neck:  No deformities, masses, or tenderness noted. Lungs:  normal respiratory effort and no wheezes.   Heart:  normal rate and regular rhythm.   Abdomen:  Bowel sounds positive,abdomen soft and non-tender without masses, organomegaly or hernias noted. Psych:  Oriented X3 and not anxious appearing.     Impression & Recommendations:  Problem # 1:  HYPERTENSION, SYSTOLIC, BORDERLINE (ICD-401.9) Assessment New poorly contromed my be contributor to HA or as aresult of pain? moniter add BP med BP today: 150/80 Prior BP: 130/80 (10/09/2009)  Labs Reviewed: K+: 4.7 (09/01/2009) Creat: : 0.8 (09/01/2009)   Chol: 195 (09/01/2009)   HDL: 58.40 (09/01/2009)   LDL: 125 (09/01/2009)   TG: 57.0 (09/01/2009)  Her updated medication list for this problem includes:    Bisoprolol-hydrochlorothiazide 5-6.25 Mg  Tabs (Bisoprolol-hydrochlorothiazide) ..... One by mouth daily ( generic drug)  Problem # 2:  SINUSITIS, CHRONIC (ICD-473.9) Assessment: Deteriorated chronic pattern The following medications were removed from the medication list:    Allerx Dose Pack Misc (Pse-methscop & cpm-pe-methscop) ..... One by mouth two times a day    Zithromax 250 Mg Tabs (Azithromycin) .Marland Kitchen..Marland Kitchen Two by mouth now and then one by mouth daily for 4 days Her updated medication list for this problem includes:    Smz-tmp Ds 800-160 Mg Tabs (Sulfamethoxazole-trimethoprim) ..... One by mouth two times a day for 10 days    Respivent-d 120-2.5 Mg Xr12h-tab (Pseudoephedrine-methscopolamin) ..... One by mouth two times a day ( may sub)  Take antibiotics for full duration. Discussed treatment options including indications for coronal CT scan of sinuses and ENT referral.   Complete Medication List: 1)  Errin 0.35 Mg Tabs (Norethindrone (contraceptive)) .... Take 1 tablet by mouth once a day 2)  Adderall Xr 30 Mg Cp24 (Amphetamine-dextroamphetamine) .... Take 1 tablet by mouth once a day 3)  Oscal 500/200 D-3 500-200 Mg-unit Tabs (Calcium-vitamin d) .... Once daily 4)  Adderall 5 Mg Tabs (Amphetamine-dextroamphetamine) .... One by mouth after noon as needed 5)  Lexapro 10 Mg Tabs (Escitalopram oxalate) .Marland Kitchen.. 1 once daily 6)  Fluconazole 150 Mg Tabs (Fluconazole) .Marland Kitchen.. 1 every week(podiastrist)  7)  Bisoprolol-hydrochlorothiazide 5-6.25 Mg Tabs (Bisoprolol-hydrochlorothiazide) .... One by mouth daily ( generic drug) 8)  Smz-tmp Ds 800-160 Mg Tabs (Sulfamethoxazole-trimethoprim) .... One by mouth two times a day for 10 days 9)  Respivent-d 120-2.5 Mg Xr12h-tab (Pseudoephedrine-methscopolamin) .... One by mouth two times a day ( may sub)  Patient Instructions: 1)  keep appointment 2)  Take your antibiotic as prescribed until ALL of it is gone, but stop if you develop a rash or swelling and contact our office as soon as  possible. Prescriptions: RESPIVENT-D 120-2.5 MG XR12H-TAB (PSEUDOEPHEDRINE-METHSCOPOLAMIN) one by mouth two times a day ( may sub)  #20 x 1   Entered and Authorized by:   Stacie Glaze MD   Signed by:   Stacie Glaze MD on 11/29/2009   Method used:   Electronically to        [id number]* (retail)       [address]       [place], [place]  [address]       Ph: [phone]       Fax: [phone]   RxID:   [id number] SMZ-TMP DS 800-160 MG TABS (SULFAMETHOXAZOLE-TRIMETHOPRIM) one by mouth two times a day for 10 days  #20 x 0   Entered and Authorized by:   Stacie Glaze MD   Signed by:   Stacie Glaze MD on 11/29/2009   Method used:   Electronically to        [id number]* (retail)       [address]       [place], [place]  [address]       Ph: [phone]       Fax: [phone]   RxID:   [id number] BISOPROLOL-HYDROCHLOROTHIAZIDE 5-6.25 MG TABS (BISOPROLOL-HYDROCHLOROTHIAZIDE) one by mouth daily ( generic drug)  #30 x 11   Entered and Authorized by:   Stacie Glaze MD   Signed by:   Stacie Glaze MD on 11/29/2009   Method used:   Electronically to        [facility] [address]* (retail)       [address]       [place], [place]  [address]       Ph: [phone]       Fax: [phone]   RxID:   [id number]

## 2010-12-11 NOTE — Miscellaneous (Signed)
Summary: [facility] Previsit/prep  Clinical Lists Changes  Medications: Added new medication of MOVIPREP 100 GM  SOLR (PEG-KCL-NACL-NASULF-NA ASC-C) As per prep instructions. - Signed Rx of MOVIPREP 100 GM  SOLR (PEG-KCL-NACL-NASULF-NA ASC-C) As per prep instructions.;  #1 x 0;  Signed;  Entered by: Wyona Almas RN;  Authorized by: Mardella Layman MD [facility];  Method used: Electronically to [facility]*, [address] [place], [place]  [id number], Ph: [phone], Fax: [phone] Observations: Added new observation of NKA: T (07/23/2010 8:40)    Prescriptions: MOVIPREP 100 GM  SOLR (PEG-KCL-NACL-NASULF-NA ASC-C) As per prep instructions.  #1 x 0   Entered by:   Wyona Almas RN   Authorized by:   Mardella Layman MD [facility]   Signed by:   Wyona Almas RN on 07/23/2010   Method used:   Electronically to        [facility]* (retail)       [address]       [place], [place]  [id number]       Ph: [phone]       Fax: [phone]   RxID:   [id number]

## 2010-12-11 NOTE — Procedures (Signed)
Summary: Colonoscopy  Patient: Misty Benson Note: All result statuses are Final unless otherwise noted.  Tests: (1) Colonoscopy (COL)   COL Colonoscopy           DONE     [facility]     [address]     [place], [place]  [address]           COLONOSCOPY PROCEDURE REPORT           PATIENT:  Misty Benson, Misty Benson  MR#:  [id number]     BIRTHDATE:  12-26-1960, 49 yrs. old  GENDER:  female     ENDOSCOPIST:  Vania Rea. Jarold Motto, MD, [facility]     REF. BY:  Stacie Glaze, M.D.     PROCEDURE DATE:  08/03/2010     PROCEDURE:  Average-risk screening colonoscopy     G0121     ASA CLASS:  Class II     INDICATIONS:  Routine Risk Screening     MEDICATIONS:   Fentanyl 75 mcg IV, Versed 8 mg IV           DESCRIPTION OF PROCEDURE:   After the risks benefits and     alternatives of the procedure were thoroughly explained, informed     consent was obtained.  Digital rectal exam was performed and     revealed no abnormalities.   The LB [id number] endoscope     was introduced through the anus and advanced to the cecum, which     was identified by both the appendix and ileocecal valve, without     limitations.  The quality of the prep was excellent, using     MoviPrep.  The instrument was then slowly withdrawn as the colon     was fully examined.     <<PROCEDUREIMAGES>>           FINDINGS:  No polyps or cancers were seen.  This was otherwise a     normal examination of the colon.   Retroflexed views in the rectum     revealed no abnormalities.    The scope was then withdrawn from     the patient and the procedure completed.           COMPLICATIONS:  None     ENDOSCOPIC IMPRESSION:     1) No polyps or cancers     2) Otherwise normal examination     RECOMMENDATIONS:     1) Continue current colorectal screening recommendations for     "routine risk" patients with a repeat colonoscopy in 10 years.     REPEAT EXAM:  No           ______________________________     Vania Rea. Jarold Motto, MD,  Clementeen Graham           CC:           n.     eSIGNED:   Vania Rea. Raja Liska at 08/03/2010 04:37 PM           Elisabeth Pigeon, [id number]  Note: An exclamation mark (!) indicates a result that was not dispersed into the flowsheet. Document Creation Date: 08/03/2010 4:37 PM _______________________________________________________________________  (1) Order result status: Final Collection or observation date-time: 08/03/2010 16:31 Requested date-time:  Receipt date-time:  Reported date-time:  Referring Physician:   Ordering Physician: Sheryn Bison [phone]) Specimen Source:  Source: Launa Grill Order Number: [id number] Lab site:   Appended Document: Colonoscopy    Clinical Lists Changes  Observations: Added new observation of  COLONNXTDUE: 07/2020 (08/03/2010 16:45)

## 2010-12-11 NOTE — Letter (Signed)
Summary: [facility]   Imported By: Maryln Gottron 08/01/2010 14:15:04  _____________________________________________________________________  External Attachment:    Type:   Image     Comment:   External Document

## 2010-12-11 NOTE — Letter (Signed)
Summary: Handout Printed  Printed Handout:  - *[facility] Patient Instructions 

## 2010-12-11 NOTE — Letter (Signed)
Summary: [facility] Instructions  [facility]  [address] [place], [place] [address]   Phone: [phone]  Fax: [phone]       Charleston Cathers    1961-08-21    MRN: [id number]        Procedure Day /Date: Friday 08/03/2010     Arrival Time: 03:00PM     Procedure Time: 02:00PM     Location of Procedure:                    _X_   Endoscopy Center (4th Floor)                        PREPARATION FOR COLONOSCOPY WITH MOVIPREP   Starting 5 days prior to your procedure 07/29/2010 do not eat nuts, seeds, popcorn, corn, beans, peas,  salads, or any raw vegetables.  Do not take any fiber supplements (e.g. Metamucil, Citrucel, and Benefiber).  THE DAY BEFORE YOUR PROCEDURE         DATE: 08/02/10  DAY: Thursday  1.  Drink clear liquids the entire day-NO SOLID FOOD  2.  Do not drink anything colored red or purple.  Avoid juices with pulp.  No orange juice.  3.  Drink at least 64 oz. (8 glasses) of fluid/clear liquids during the day to prevent dehydration and help the prep work efficiently.  CLEAR LIQUIDS INCLUDE: Water Jello Ice Popsicles Tea (sugar ok, no milk/cream) Powdered fruit flavored drinks Coffee (sugar ok, no milk/cream) Gatorade Juice: apple, white grape, white cranberry  Lemonade Clear bullion, consomm, broth Carbonated beverages (any kind) Strained chicken noodle soup Hard Candy                             4.  In the morning, mix first dose of MoviPrep solution:    Empty 1 Pouch A and 1 Pouch B into the disposable container    Add lukewarm drinking water to the top line of the container. Mix to dissolve    Refrigerate (mixed solution should be used within 24 hrs)  5.  Begin drinking the prep at 5:00 p.m. The MoviPrep container is divided by 4 marks.   Every 15 minutes drink the solution down to the next mark (approximately 8 oz) until the full liter is complete.   6.  Follow completed prep with 16 oz of clear liquid of your choice  (Nothing red or purple).  Continue to drink clear liquids until bedtime.  7.  Before going to bed, mix second dose of MoviPrep solution:    Empty 1 Pouch A and 1 Pouch B into the disposable container    Add lukewarm drinking water to the top line of the container. Mix to dissolve    Refrigerate  THE DAY OF YOUR PROCEDURE      DATE:08/03/10   DAY: Friday  Beginning at 11:00AM (5 hours before procedure):         1. Every 15 minutes, drink the solution down to the next mark (approx 8 oz) until the full liter is complete.  2. Follow completed prep with 16 oz. of clear liquid of your choice.    3. You may drink clear liquids until 2:00PM (2 HOURS BEFORE PROCEDURE).   MEDICATION INSTRUCTIONS  Unless otherwise instructed, you should take regular prescription medications with a small sip of water   as early as possible the morning of your procedure.  OTHER INSTRUCTIONS  You will need a responsible adult at least 51 years of age to accompany you and drive you home.   This person must remain in the waiting room during your procedure.  Wear loose fitting clothing that is easily removed.  Leave jewelry and other valuables at home.  However, you may wish to bring a book to read or  an iPod/MP3 player to listen to music as you wait for your procedure to start.  Remove all body piercing jewelry and leave at home.  Total time from sign-in until discharge is approximately 2-3 hours.  You should go home directly after your procedure and rest.  You can resume normal activities the  day after your procedure.  The day of your procedure you should not:   Drive   Make legal decisions   Operate machinery   Drink alcohol   Return to work  You will receive specific instructions about eating, activities and medications before you leave.    The above instructions have been reviewed and explained to me by   Wyona Almas RN  July 23, 2010 9:08 AM     I fully  understand and can verbalize these instructions _____________________________ Date _________   Appended Document: Moviprep Instructions Procedure time is 4PM

## 2011-01-11 ENCOUNTER — Other Ambulatory Visit: Payer: Self-pay | Admitting: *Deleted

## 2011-01-11 MED ORDER — CETIRIZINE HCL 10 MG PO TABS: 10.0000 mg | ORAL_TABLET | Freq: Every day | ORAL | Status: AC

## 2011-01-14 ENCOUNTER — Other Ambulatory Visit: Payer: Self-pay | Admitting: Dermatology

## 2011-01-22 ENCOUNTER — Encounter: Payer: Self-pay | Admitting: Internal Medicine

## 2011-01-22 ENCOUNTER — Ambulatory Visit (INDEPENDENT_AMBULATORY_CARE_PROVIDER_SITE_OTHER): Payer: BC Managed Care – PPO | Admitting: Internal Medicine

## 2011-01-22 DIAGNOSIS — E669 Obesity, unspecified: Secondary | ICD-10-CM

## 2011-01-22 DIAGNOSIS — I10 Essential (primary) hypertension: Secondary | ICD-10-CM

## 2011-01-22 DIAGNOSIS — R51 Headache: Secondary | ICD-10-CM

## 2011-01-22 DIAGNOSIS — G44009 Cluster headache syndrome, unspecified, not intractable: Secondary | ICD-10-CM

## 2011-01-22 DIAGNOSIS — M722 Plantar fascial fibromatosis: Secondary | ICD-10-CM

## 2011-01-22 DIAGNOSIS — F988 Other specified behavioral and emotional disorders with onset usually occurring in childhood and adolescence: Secondary | ICD-10-CM

## 2011-01-22 MED ORDER — AMPHETAMINE-DEXTROAMPHET ER 30 MG PO CP24: 30.0000 mg | ORAL_CAPSULE | ORAL | Status: DC

## 2011-01-22 MED ORDER — FLUTICASONE PROPIONATE 50 MCG/ACT NA SUSP: 2.0000 | Freq: Every day | NASAL | Status: DC

## 2011-01-22 MED ORDER — AMPHETAMINE-DEXTROAMPHETAMINE 5 MG PO TABS: 5.0000 mg | ORAL_TABLET | Freq: Every day | ORAL | Status: DC | PRN

## 2011-01-22 NOTE — Assessment & Plan Note (Signed)
Stable blood pressure 

## 2011-01-22 NOTE — Assessment & Plan Note (Signed)
Stretching and icing discussed as well as referral to podiatry

## 2011-01-22 NOTE — Assessment & Plan Note (Addendum)
Blood pressure stable on verapamil with control of cluster headaches with the verapamil ccb protocol

## 2011-01-22 NOTE — Assessment & Plan Note (Signed)
Weight increased an exercise lacking due to plantar facial pain

## 2011-02-27 ENCOUNTER — Ambulatory Visit (INDEPENDENT_AMBULATORY_CARE_PROVIDER_SITE_OTHER): Payer: BC Managed Care – PPO | Admitting: Internal Medicine

## 2011-02-27 ENCOUNTER — Encounter: Payer: Self-pay | Admitting: Internal Medicine

## 2011-02-27 VITALS — BP 136/80 | HR 76 | Temp 98.2°F | Resp 14 | Ht 68.0 in | Wt 242.0 lb

## 2011-02-27 DIAGNOSIS — J329 Chronic sinusitis, unspecified: Secondary | ICD-10-CM

## 2011-02-27 DIAGNOSIS — T148XXA Other injury of unspecified body region, initial encounter: Secondary | ICD-10-CM

## 2011-02-27 DIAGNOSIS — F909 Attention-deficit hyperactivity disorder, unspecified type: Secondary | ICD-10-CM

## 2011-02-27 MED ORDER — LEVOFLOXACIN 500 MG PO TABS: 500.0000 mg | ORAL_TABLET | Freq: Every day | ORAL | Status: AC

## 2011-02-27 MED ORDER — AMPHETAMINE-DEXTROAMPHETAMINE 5 MG PO TABS: 5.0000 mg | ORAL_TABLET | Freq: Every day | ORAL | Status: DC | PRN

## 2011-02-27 MED ORDER — FLUCONAZOLE 100 MG PO TABS: 100.0000 mg | ORAL_TABLET | Freq: Once | ORAL | Status: AC

## 2011-02-27 MED ORDER — AMPHETAMINE-DEXTROAMPHET ER 30 MG PO CP24: 30.0000 mg | ORAL_CAPSULE | ORAL | Status: DC
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
## 2011-03-05 ENCOUNTER — Other Ambulatory Visit: Payer: Self-pay | Admitting: *Deleted

## 2011-03-05 MED ORDER — CITALOPRAM HYDROBROMIDE 10 MG PO TABS: 10.0000 mg | ORAL_TABLET | Freq: Every day | ORAL | Status: DC

## 2011-03-29 NOTE — Op Note (Signed)
[place]  Patient:    Misty Benson, Misty Benson                       MRN: [id number] Proc. Date: 03/24/00 Adm. Date:  [id number] Disc. Date: [id number] Attending:  Madelyn Flavors                           Operative Report  PREOPERATIVE DIAGNOSES:  Symptomatic pelvic relaxation with cystocele with prolapse, rectocele with prolapse to the introitus, uterine enlargement and marked uterine descensus and relaxation of the vaginal outlet.  POSTOPERATIVE DIAGNOSES:  Symptomatic pelvic relaxation with cystocele with prolapse, rectocele with prolapse to the introitus, uterine enlargement and marked uterine descensus and relaxation of the vaginal outlet, plus - pelvic adhesions.  OPERATIVE PROCEDURE:  Total vaginal hysterectomy, bilateral salpingo-oophorectomy and lysis of pelvic adhesions, anterior/posterior colporrhaphy, sacrospinous ligament suspension of vagina, suprapubic cystotomy.  SURGEON:  Freddy Finner, M.D.  ASSISTANT:  ______.  ESTIMATED INTRAOPERATIVE BLOOD LOSS:  100 cc.  INTRAOPERATIVE COMPLICATIONS:  None.  The details of the Present Illness are recorded in the Admission Note.  The patient was admitted on the morning of surgery.  She was given a bolus of ampicillin and one of gentamicin ______ preoperatively as prophylaxis for mitral valve prolapse.  She was placed in PISOs.  She was brought to the operating room and there placed under adequate general endotracheal anesthesia and placed in the dorsal lithotomy position using the [place] stirrup system.  Betadine prep of the lower abdomen, perineum and vagina was carried out in the usual fashion with scrub followed by  Betadine solution.  Sterile drapes were applied.  The bladder was evacuated with a Robinson catheter.  The cervix was visualized and grasped with a Reason tenaculum.  A posterior colpotomy incision was made while tenting the posterior vaginal mucosa with an Allis  clamp.  Dissection freed the rectum from the cervix and there were adhesions in the cul-de-sac.  The cervix was circumscribed with a scalpel.  Uterosacral pedicles were then developed using curved ______; these were divided sharply, ligated with 0-Monocril in a Heine fashion.  Bladder pulls were taken separately, divided sharply and ligated with 0-Monocril.  Cardinal ligament pedicles were then taken in two ______ and the bladder carefully advanced off the cervix.  Each of  these ______ was divided sharply and ligated with 0-Monocril.   The anterior peritoneum was then entered, progressive pedicles were developed and next was the renal artery pedicle and one pedicle was taken above the uterine ______ on each side.  Each of these was divided sharply and ligated with 0-Monocril.  Blunt dissection of adhesions in the adnexa was then carried out and freed the uterus at the ______ of the bowel.  The uterus was delivered through to the [facility] ______ introitus.  Curved ______ were placed on the utero-ovarian pedicles on each side; each of these was divided and the uterus removed.  With careful blunt dissection, the ovaries were freed up; the ______ ovary on each side was then grasped with a Babcock.  The ______ of the pelvic ligament was crossclamped with a Heine.  The tube and ovary on each side were removed.  Each of these pedicles was then doubly ligated with ______ Monocril followed by suture ligatures and 0-Monocril.  Hemostasis was complete.  The uterosacrals were then  plicated and posterior peritoneum closed.  A segment of the mucosa was excised  posteriorly because of redundancy with what  appeared to be an alcoholic developing enterocele.  The cuff was closed over the posterior two-thirds with figure-of-eight and 0-Monocril.  In the process of closing the catheter, uterosacrals were anchored to the vaginal mucosa.  The anterior mucosal edge was then grasped on each side  and tender in the midline. With careful sharp and blunt dissection, the mucosa was freed from the bladder nd from the vesicovaginal fascia.  This was continued to a distance of approximately 1 cm from the urethral meatus.  Plication sutures of 0-Monocril were then used to  approximate the vesicovaginal fascia and re-elevate the urethrovesical angle to  plicate the urethral ______ and to reduce the cystocele.  Urethral length was checked at approximately 2.5 to 3.0 cm.  The urethra was tightened.  Segments of mucosa were excised.  The cuff was then closed with figure-of-eights and 0-Monocril.  The mucosa was then closed with a running 2-0 Vicryl suture.  Attention was turned posteriorly.  The posterior fourchette was grasped with the Allisons ________; a section of skin was excised from the perineum with a ______ superiorly.  The rectum was then carefully dissected with blunt and sharp dissection and incision carried virtually to the periosteum, or approximately 1 cm from the cuff.  Careful dissection of the perirectal tissues was carried out. Plication sutures of 0-Monocril were used to reduce the rectocele and recreate he rectovaginal septum.  Please note that a #1 PDS suture was placed after careful  palpation of the uterosacral ligament using a ______ clamp and this was sutured to the vaginal eyecox ______ before completing the posterior repair.  The ______ were elevated and approximated to the length of the perineum and thereby lengthened the vagina.  The posterior mucosa were closed ______ using running 0-Monocril.  After this was approximated 3 cm, it was elected to tie the uterosacral ligament suspension stitch and this was done with marked elevation of the vaginal ______.  The mucosa and perineum were then closed in a fashion similar to an episiotomy y continuing with the 2-0 Monocril suture.  The bladder was then filled with 300 c of irrigating solution and a  ______ suprapubic catheter was placed and anchored to the skin with 3-0 nylon sutures, without difficulty.  Two inch Iodoform gauze pack was placed.   All packs, needles and instrument counts were correct.  The procedure was  terminated.  At this point, the patient was taken to the recovery room in good condition. DD:  03/24/00 TD:  03/24/00 Job: [id number]

## 2011-03-29 NOTE — Op Note (Signed)
[facility]  Patient:    Misty Benson, Misty Benson                       MRN: [id number] Proc. Date: 03/24/00 Adm. Date:  [id number] Attending:  Minette Headland                           Operative Report  PREOPERATIVE DIAGNOSES:       1. Intrauterine pregnancy at term.                               2. Macrosomia.                               3. History of shoulder dystocia with large                                  infant with second delivery, which is the                                  one preceding this delivery.  POSTOPERATIVE DIAGNOSES:      1. Intrauterine pregnancy at term.                               2. Macrosomia.                               3. History of shoulder dystocia with large                                  infant with second delivery, which is the                                  one preceding this delivery.                               4. Delivery of a viable 9 pound 10 ounce female                                  infant with Apgars of 8 and 9; cord pH was                                  7.26.  OPERATION:  SURGEON:                      Freddy Finner, M.D.  ASSISTANTWilley Blade, M.D.  ANESTHESIA:                   Spinal.  INTRAOPERATIVE COMPLICATIONS:  None.  ESTIMATED BLOOD LOSS:         Intraoperative blood loss; 600 cc.  INDICATIONS:                  The details of the present illness are recorded in the admission note.  The patient was now admitted at term.  She had an amniocentesis with a 1.1 ratio ______ at 37 weeks with the idea for induction of labor.  It was elected to schedule a cesarean, which is done now, and she was admitted for that purpose.  DESCRIPTION OF PROCEDURE:     She was admitted on the morning of surgery and was brought to the operating room.  The patient was placed under adequate spinal anesthesia and placed in the dorsal recumbent position  with elevation of 15 degrees.  The abdomen was prepped and draped in the usual sterile fashion.  A Foley catheter was placed using sterile technique.  Sterile drapes were applied.  An abdominal transverse incision was made and carried sharply down to the fascia, which was entered sharply to the extent of the skin incision. Rectus____________ was developed superiorly and inferiorly with both sharp and blunt dissection.  The rectus muscle was divided and then the peritoneal flap was extended to the extent of the skin incision.  A transverse incision was made in the visceral peritoneum exposing the underlying intrauterine segment and the bladder was bluntly dissected off the lower segment with a transverse incision and the lower segment was extended blindly in a transverse direction.  Amniotomy was accomplished in this process and the fluid was clear.  A viable female infant was then delivered with a vacuum assisted delivery without significant difficulty or complications.  Apgars and pH are as noted above.  Cord blood was obtained for routine studies and arterial blood gases.  The placenta was removed and was confirmed complete by normal expression of the uterus.  The uterine incision was then closed in a single layer of running locking 0-Monocril.  The bladder flap was reapproximated with a running 0-Monocril. Irrigation was carried out.  Hemostasis was complete.  The tubes and ovaries were inspected, and found to be normal bilaterally.  The uterus was palpably normal.  With appropriate counts the abdomen was closed in layers.  Running 0-Monocril was used to close the peritoneum and reapproximate the rectus muscles.  The fascia was closed with running 0-PDS.  The subcutaneous tissue with interrupted 2-0 plain.  Skin was closed with large skin staples.  Steri-Strips were not applied because of her TAPE allergy.  The procedure was terminated.   The patient was taken recovery in  good condition. DD:  03/24/00 TD:  03/24/00 Job: [id number]

## 2011-03-29 NOTE — Assessment & Plan Note (Signed)
[facility]                                 ON-CALL NOTE   NAME:Misty Benson, Misty Benson                       MRN:          [id number]  DATE:03/01/2007                            DOB:          Aug 05, 1961    SUBJECTIVE:  Misty Benson has had nausea, diarrhea, headache and stomach  cramps and some dizziness.  No fever ir chills, tolerating fluids.   ASSESSMENT/PLAN:  Likely viral gastroenteritis.  Recommended gentle  gradual hydration.  If symptoms do not improve, recommend evaluation  early in the week by her primary care physician.     Misty Nora, MD  Electronically Signed    AB/MedQ  DD: 03/02/2007  DT: 03/02/2007  Job #: [id number]

## 2011-03-29 NOTE — Assessment & Plan Note (Signed)
[facility]                                 ON-CALL NOTE   NAME:Chappelle, KRISTENA WILHELMI                       MRN:          [id number]  DATE:11/08/2006                            DOB:          1961-05-24    Time of call; midday.  Phone number [phone].  The patient threw her  back out and went to the chiropractor.  However, this did not help,  would like to try some Flexeril for muscle spasmm, has used this before.  No pertinent allergies.  Flexeril 10 mg one-half to one t.i.d. p.r.n.  #30 called in to [facility] at [phone] with no refills, and she  should schedule an ROV with Dr. Lovell Sheehan for followup.     Neta Mends. Panosh, MD  Electronically Signed    WKP/MedQ  DD: 11/08/2006  DT: 11/09/2006  Job #: [id number]

## 2011-03-29 NOTE — Discharge Summary (Signed)
[facility]  Patient:    Misty Benson, Misty Benson                       MRN: [id number] Adm. Date:  [id number] Disc. Date: [id number] Attending:  Minette Headland Dictator:   Danie Chandler, R.N.                           Discharge Summary  ADMISSION DIAGNOSES:          1. Intrauterine pregnancy at term.                               2. Macrosomia.                               3. History of shoulder dystocia with large infant                                  with second delivery.  DISCHARGE DIAGNOSES:          1. Intrauterine pregnancy at term.                               2. Macrosomia.                               3. History of shoulder dystocia with large infant                                  with second delivery.  PROCEDURE:                    On Mar 24, 2000, primary low transverse cesarean section.  REASON FOR ADMISSION:         Please see H&P for details.  The patient was admitted at term to undergo a primary cesarean section.  HOSPITAL COURSE:              The patient was taken to the operating room and underwent the above named procedure without complications.  This was productive of a viable female infant with Apgars of 8 at one minute and 9 at five minutes and an arterial cord pH of 7.26.  Postoperatively on day #1, the patients hemoglobin was 11.3, hematocrit 34.0, and white blood cell count 10.5.  On postoperative day #2, the patient had a good return of bowel function and was tolerating a regular diet. She was also ambulating well without difficulty.  On postpartum day #3, the patient had better pain control and she was discharged home on postoperative day #4.  CONDITION ON DISCHARGE:       Good.  Diet regular as tolerated.  Activity; no heavy lifting, no driving, and no vaginal entry.  FOLLOW-UP:                    She is to follow up in the office in one to two weeks for incision check and she is to call for a temperature  greater than 100 degrees, persistent nausea and vomiting, heavy vaginal bleeding, and/or redness  or drainage from the incision site.  DISCHARGE MEDICATIONS:        1. Prenatal vitamins one p.o. q.d.                               2. Tylox #40 one p.o. q.4h. p.r.n. pain.                               3. Motrin 600 mg q.6h. p.r.n. pain. DD:  03/28/00 TD:  03/30/00 Job: [id number]

## 2011-04-16 ENCOUNTER — Telehealth: Payer: Self-pay | Admitting: Internal Medicine

## 2011-04-16 NOTE — Telephone Encounter (Signed)
Have you seen them?
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
## 2011-04-17 NOTE — Telephone Encounter (Signed)
Per dr Lovell Sheehan- was sent back to where xray was done- pt informedin

## 2011-04-24 ENCOUNTER — Ambulatory Visit: Payer: BC Managed Care – PPO | Admitting: Internal Medicine

## 2011-05-09 ENCOUNTER — Encounter: Payer: Self-pay | Admitting: Internal Medicine

## 2011-05-20 ENCOUNTER — Encounter: Payer: Self-pay | Admitting: Internal Medicine

## 2011-05-20 ENCOUNTER — Ambulatory Visit (INDEPENDENT_AMBULATORY_CARE_PROVIDER_SITE_OTHER): Payer: BC Managed Care – PPO | Admitting: Internal Medicine

## 2011-05-20 VITALS — BP 136/84 | HR 76 | Temp 98.3°F | Resp 16 | Ht 68.0 in | Wt 234.0 lb

## 2011-05-20 DIAGNOSIS — G44009 Cluster headache syndrome, unspecified, not intractable: Secondary | ICD-10-CM

## 2011-05-20 DIAGNOSIS — F909 Attention-deficit hyperactivity disorder, unspecified type: Secondary | ICD-10-CM

## 2011-05-20 DIAGNOSIS — I10 Essential (primary) hypertension: Secondary | ICD-10-CM

## 2011-05-20 DIAGNOSIS — F988 Other specified behavioral and emotional disorders with onset usually occurring in childhood and adolescence: Secondary | ICD-10-CM

## 2011-05-20 DIAGNOSIS — E669 Obesity, unspecified: Secondary | ICD-10-CM

## 2011-05-20 MED ORDER — AMPHETAMINE-DEXTROAMPHET ER 30 MG PO CP24: 30.0000 mg | ORAL_CAPSULE | ORAL | Status: DC

## 2011-05-20 MED ORDER — FLUTICASONE PROPIONATE 50 MCG/ACT NA SUSP: 2.0000 | Freq: Every day | NASAL | Status: DC

## 2011-05-20 MED ORDER — AMPHETAMINE-DEXTROAMPHETAMINE 5 MG PO TABS: 5.0000 mg | ORAL_TABLET | Freq: Every day | ORAL | Status: DC | PRN
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
## 2011-06-07 ENCOUNTER — Other Ambulatory Visit: Payer: Self-pay | Admitting: Internal Medicine

## 2011-06-07 MED ORDER — BUTALBITAL-ACETAMINOPHEN 50-650 MG PO TABS: 50.0000 mg | ORAL_TABLET | Freq: Four times a day (QID) | ORAL | Status: DC | PRN

## 2011-06-07 NOTE — Telephone Encounter (Signed)
Pt req refill of ACETAMINOPHEN-BUTALBITAL (BUPAP) 50-650 MG TABS, Pls call in to [facility] at [facility]. Pts headaches have come back this week.

## 2011-06-07 NOTE — Telephone Encounter (Signed)
Sent in

## 2011-06-14 ENCOUNTER — Other Ambulatory Visit: Payer: Self-pay | Admitting: Internal Medicine

## 2011-07-20 ENCOUNTER — Other Ambulatory Visit: Payer: Self-pay | Admitting: Internal Medicine

## 2011-07-31 ENCOUNTER — Other Ambulatory Visit: Payer: Self-pay | Admitting: Dermatology

## 2011-08-13 ENCOUNTER — Other Ambulatory Visit (INDEPENDENT_AMBULATORY_CARE_PROVIDER_SITE_OTHER): Payer: BC Managed Care – PPO

## 2011-08-13 DIAGNOSIS — Z0000 Encounter for general adult medical examination without abnormal findings: Secondary | ICD-10-CM

## 2011-08-13 LAB — CBC WITH DIFFERENTIAL/PLATELET
Basophils Absolute: 0 10*3/uL (ref 0.0–0.1)
Basophils Relative: 0.4 % (ref 0.0–3.0)
Eosinophils Absolute: 0.2 10*3/uL (ref 0.0–0.7)
HCT: 39.8 % (ref 36.0–46.0)
Hemoglobin: 13 g/dL (ref 12.0–15.0)
Lymphs Abs: 1.3 10*3/uL (ref 0.7–4.0)
MCHC: 32.7 g/dL (ref 30.0–36.0)
MCV: 93.4 fl (ref 78.0–100.0)
Neutro Abs: 4.3 10*3/uL (ref 1.4–7.7)
RDW: 14.1 % (ref 11.5–14.6)

## 2011-08-13 LAB — BASIC METABOLIC PANEL
CO2: 28 mEq/L (ref 19–32)
Chloride: 107 mEq/L (ref 96–112)
Glucose, Bld: 81 mg/dL (ref 70–99)
Potassium: 4 mEq/L (ref 3.5–5.1)
Sodium: 142 mEq/L (ref 135–145)

## 2011-08-13 LAB — POCT URINALYSIS DIPSTICK
Bilirubin, UA: NEGATIVE
Glucose, UA: NEGATIVE
Ketones, UA: NEGATIVE
Leukocytes, UA: NEGATIVE
Nitrite, UA: NEGATIVE
pH, UA: 5

## 2011-08-13 LAB — HEPATIC FUNCTION PANEL
ALT: 24 U/L (ref 0–35)
AST: 17 U/L (ref 0–37)
Bilirubin, Direct: 0 mg/dL (ref 0.0–0.3)
Total Bilirubin: 0.5 mg/dL (ref 0.3–1.2)

## 2011-08-13 LAB — LIPID PANEL
Total CHOL/HDL Ratio: 3
VLDL: 15 mg/dL (ref 0.0–40.0)

## 2011-08-20 ENCOUNTER — Encounter: Payer: Self-pay | Admitting: Internal Medicine

## 2011-08-20 ENCOUNTER — Ambulatory Visit (INDEPENDENT_AMBULATORY_CARE_PROVIDER_SITE_OTHER): Payer: BC Managed Care – PPO | Admitting: Internal Medicine

## 2011-08-20 VITALS — BP 144/80 | HR 72 | Temp 98.2°F | Resp 16 | Ht 68.0 in | Wt 224.0 lb

## 2011-08-20 DIAGNOSIS — Z0000 Encounter for general adult medical examination without abnormal findings: Secondary | ICD-10-CM

## 2011-08-20 DIAGNOSIS — F909 Attention-deficit hyperactivity disorder, unspecified type: Secondary | ICD-10-CM

## 2011-08-20 DIAGNOSIS — I10 Essential (primary) hypertension: Secondary | ICD-10-CM

## 2011-08-20 MED ORDER — AMPHETAMINE-DEXTROAMPHET ER 30 MG PO CP24: 30.0000 mg | ORAL_CAPSULE | ORAL | Status: DC

## 2011-08-20 MED ORDER — AMPHETAMINE-DEXTROAMPHETAMINE 5 MG PO TABS: 5.0000 mg | ORAL_TABLET | Freq: Every day | ORAL | Status: DC | PRN

## 2011-08-20 NOTE — Patient Instructions (Signed)
Patient was instructed to continue all medications as prescribed. To stop at the checkout desk and schedule a followup appointment  

## 2011-08-21 ENCOUNTER — Other Ambulatory Visit: Payer: Self-pay | Admitting: Gynecology

## 2011-08-21 DIAGNOSIS — I10 Essential (primary) hypertension: Secondary | ICD-10-CM | POA: Insufficient documentation

## 2011-08-21 DIAGNOSIS — Z1231 Encounter for screening mammogram for malignant neoplasm of breast: Secondary | ICD-10-CM

## 2011-08-22 ENCOUNTER — Telehealth: Payer: Self-pay | Admitting: Internal Medicine

## 2011-08-22 ENCOUNTER — Ambulatory Visit
Admission: RE | Admit: 2011-08-22 | Discharge: 2011-08-22 | Disposition: A | Discharge status: Home / Self Care | Payer: BC Managed Care – PPO | Source: Ambulatory Visit | Attending: Gynecology | Admitting: Gynecology

## 2011-08-22 DIAGNOSIS — Z1231 Encounter for screening mammogram for malignant neoplasm of breast: Secondary | ICD-10-CM

## 2011-08-22 NOTE — Telephone Encounter (Signed)
Pt is req that her Adderall XR 30 mg to be changed to Adderall 20 mg tab bid, due to pts insurance. Pt can get #60 and then cut tabs in half for much less money.

## 2011-08-22 NOTE — Telephone Encounter (Signed)
Pt informed it will be Friday before dr Lovell Sheehan returns to answer question. Pt informed she will have to bring the other scripts back when she picks up new ones.

## 2011-08-23 ENCOUNTER — Other Ambulatory Visit: Payer: Self-pay | Admitting: *Deleted

## 2011-08-23 MED ORDER — AMPHETAMINE-DEXTROAMPHETAMINE 20 MG PO TABS: 20.0000 mg | ORAL_TABLET | Freq: Two times a day (BID) | ORAL | Status: DC

## 2011-08-23 NOTE — Telephone Encounter (Signed)
Ok per dr Lovell Sheehan- script is ready for pick up

## 2011-08-28 ENCOUNTER — Other Ambulatory Visit (HOSPITAL_COMMUNITY)
Admission: RE | Admit: 2011-08-28 | Discharge: 2011-08-28 | Disposition: A | Discharge status: Home / Self Care | Payer: BC Managed Care – PPO | Source: Ambulatory Visit | Attending: Gynecology | Admitting: Gynecology

## 2011-08-28 ENCOUNTER — Encounter: Payer: Self-pay | Admitting: Gynecology

## 2011-08-28 ENCOUNTER — Ambulatory Visit (INDEPENDENT_AMBULATORY_CARE_PROVIDER_SITE_OTHER): Payer: BC Managed Care – PPO | Admitting: Gynecology

## 2011-08-28 VITALS — BP 128/82 | Ht 68.0 in | Wt 226.0 lb

## 2011-08-28 DIAGNOSIS — Z01419 Encounter for gynecological examination (general) (routine) without abnormal findings: Secondary | ICD-10-CM | POA: Insufficient documentation

## 2011-08-28 DIAGNOSIS — N926 Irregular menstruation, unspecified: Secondary | ICD-10-CM

## 2011-08-28 NOTE — Progress Notes (Signed)
BREA COLESON May 08, 1961 [id number]        50 y.o.  for annual exam.  Doing well. Having some irregularity in her cycles she'll skip a month or so and then have a regular menses. She's not having hot flushes night sweats or other symptoms.  She does have a history of microinvasive cervical cancer status post cone in 1987. Pap smears and HPV screens have been negative since.  Past medical history,surgical history, medications, allergies, family history and social history were all reviewed and documented in the EPIC chart. ROS:  Was performed and pertinent positives and negatives are included in the history.  Exam: chaperone present Filed Vitals:   08/28/11 0917  BP: 128/82   General appearance  Normal Skin grossly normal Head/Neck normal with no cervical or supraclavicular adenopathy thyroid normal Lungs  clear Cardiac RR, without RMG Abdominal  soft, nontender, without masses, organomegaly or hernia Breasts  examined lying and sitting without masses, retractions, discharge or axillary adenopathy. Pelvic  Ext/BUS/vagina  normal   Cervix  normal  Pap done  Uterus  anteverted, normal size, shape and contour, midline and mobile nontender   Adnexa  Without masses or tenderness    Anus and perineum  normal   Rectovaginal  normal sphincter tone without palpated masses or tenderness.    Assessment/Plan:  50 y.o. female for annual exam.    1. History of microinvasive squamous cell carcinoma the cervix 1987. Pap was done today. I discussed the current screening recommendations and whether we should go to less frequent screen. I think given the significance of her atypia even though a number of years ago will continue annual cytology at this point and she agrees with this. 2. Menstrual irregularity. Patient is having mild menstrual irregularity consistent with her age. She's not having hot flushes night sweats or other symptoms. I recommended we observe at present as long as she has regular  menses although less frequent we'll follow if she has prolonged or atypical bleeding or starts having significant hormonal symptoms she will represent. I did review the issues of HRT with her to include the WHI study increased risk of stroke heart attack DVT breast cancer issues and the ACOG and NAMS recommendations for lowest dose for shortest period of time. Patient will call me if she does develop symptoms but this point we will observe. 3. Health maintenance. Self breast exams on a monthly basis discussed adverse. Had mammogram this year and she will continue with annual mammography. She had a colonoscopy last year. Routine blood work was all done through Dr. Lovell Sheehan office recently and no blood work was done today. Patient will see me in a year sooner if significant menstrual irregularity or symptoms present.    Dara Lords MD, 10:20 AM 08/28/2011

## 2011-09-20 ENCOUNTER — Telehealth: Payer: Self-pay | Admitting: *Deleted

## 2011-09-20 NOTE — Telephone Encounter (Signed)
Pt was given Dr. Jenkin's recommendations. 

## 2011-09-20 NOTE — Telephone Encounter (Signed)
Check bp at pharmacy-if elevated to Saturday clinic at [facility] 9-2

## 2011-09-20 NOTE — Telephone Encounter (Signed)
Pt has been having headaches and nausea for a few days and is concerned about her BP.  Would like to see Dr. Lovell Sheehan today.

## 2011-10-02 ENCOUNTER — Other Ambulatory Visit: Payer: Self-pay | Admitting: Internal Medicine

## 2011-11-12 DIAGNOSIS — D219 Benign neoplasm of connective and other soft tissue, unspecified: Secondary | ICD-10-CM

## 2011-11-12 HISTORY — DX: Benign neoplasm of connective and other soft tissue, unspecified: D21.9
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
## 2011-11-27 ENCOUNTER — Ambulatory Visit: Payer: BC Managed Care – PPO | Admitting: Internal Medicine

## 2011-11-27 ENCOUNTER — Ambulatory Visit (INDEPENDENT_AMBULATORY_CARE_PROVIDER_SITE_OTHER): Payer: BC Managed Care – PPO | Admitting: Internal Medicine

## 2011-11-27 ENCOUNTER — Encounter: Payer: Self-pay | Admitting: Internal Medicine

## 2011-11-27 VITALS — BP 130/80 | HR 76 | Temp 98.2°F | Resp 16 | Ht 68.0 in | Wt 228.0 lb

## 2011-11-27 DIAGNOSIS — E669 Obesity, unspecified: Secondary | ICD-10-CM

## 2011-11-27 DIAGNOSIS — I10 Essential (primary) hypertension: Secondary | ICD-10-CM

## 2011-11-27 MED ORDER — AMPHETAMINE-DEXTROAMPHETAMINE 20 MG PO TABS: 20.0000 mg | ORAL_TABLET | Freq: Two times a day (BID) | ORAL | Status: DC

## 2011-11-27 NOTE — Progress Notes (Signed)
Subjective:    Patient ID: Misty Benson, female    DOB: 09/03/1961, 51 y.o.   MRN: [id number]  HPI  History presents for following of obesity and her ADHD she has been on a 20 mg of Adderall twice a day protocol since the cost of the extended release medication and exceeded her budget.  She's tolerated this well states that she has been able to focus she is participating in an exercise program and watching her diet has not gained weight but she has not lost weight she has a history of borderline systolic hypertension but her blood pressures recently have been in the normal range and her headache pattern has been stable  Review of Systems  Constitutional: Negative for activity change, appetite change and fatigue.  HENT: Negative for ear pain, congestion, neck pain, postnasal drip and sinus pressure.   Eyes: Negative for redness and visual disturbance.  Respiratory: Negative for cough, shortness of breath and wheezing.   Gastrointestinal: Negative for abdominal pain and abdominal distention.  Genitourinary: Negative for dysuria, frequency and menstrual problem.  Musculoskeletal: Negative for myalgias, joint swelling and arthralgias.  Skin: Negative for rash and wound.  Neurological: Negative for dizziness, weakness and headaches.  Hematological: Negative for adenopathy. Does not bruise/bleed easily.  Psychiatric/Behavioral: Negative for sleep disturbance and decreased concentration.   Past Medical History  Diagnosis Date  . Depression   . ADHD (attention deficit hyperactivity disorder)   . Obesity   . Hypertension   . Cancer     BASAL CELL FACE AND SHOULDERS , SQUAMOS CELL ON HAND AND MELANOMA LEFT ARM.Marland Kitchen.  . Cervix cancer 1987    microinvasive    History   Social History  . Marital Status: Married    Spouse Name: N/A    Number of Children: N/A  . Years of Education: N/A   Occupational History  . Not on file.   Social History Main Topics  . Smoking status: Never Smoker     . Smokeless tobacco: Never Used  . Alcohol Use: Yes     WINE   . Drug Use: No  . Sexually Active: Yes -- Female partner(s)    Birth Control/ Protection: Surgical     PARTNER WITH VASECTOMY   Other Topics Concern  . Not on file   Social History Narrative  . No narrative on file    Past Surgical History  Procedure Date  . Tonsillectomy     as a child  . Bunionectomy     LEFT FOOT WITH PINS  . Cesarean section   . Dilation and curettage of uterus     x2  . Cervical cone biopsy 1987    Family History  Problem Relation Age of Onset  . Stroke Mother   . Osteoporosis Mother   . Hyperlipidemia Father   . Breast cancer Maternal Grandmother 57    No Known Allergies  Current Outpatient Prescriptions on File Prior to Visit  Medication Sig Dispense Refill  . ACETAMINOPHEN-BUTALBITAL 50-650 MG TABS Take 50-650 mg by mouth every 6 (six) hours as needed.  30 each  1  . calcium-vitamin D (OSCAL WITH D 500-200) 500-200 MG-UNIT per tablet Take 1 tablet by mouth daily.        . cetirizine (ZYRTEC) 10 MG tablet Take 1 tablet (10 mg total) by mouth daily.  30 tablet  11  . citalopram (CELEXA) 10 MG tablet TAKE ONE TABLET BY MOUTH ONE TIME DAILY  30 tablet  5  .  fluticasone (FLONASE) 50 MCG/ACT nasal spray Place 2 sprays into the nose daily.  16 g  11  . hydrochlorothiazide (,MICROZIDE/HYDRODIURIL,) 12.5 MG capsule TAKE ONE CAPSULE BY MOUTH ONE TIME DAILY  30 capsule  10  . verapamil (COVERA HS) 240 MG (CO) 24 hr tablet Take 240 mg by mouth at bedtime.          BP 130/80  Pulse 76  Temp 98.2 F (36.8 C)  Resp 16  Ht 5\' 8"  (1.727 m)  Wt 228 lb (103.42 kg)  BMI 34.67 kg/m2       Objective:   Physical Exam  Nursing note and vitals reviewed. Constitutional: She is oriented to person, place, and time. She appears well-developed and well-nourished. No distress.  HENT:  Head: Normocephalic and atraumatic.  Right Ear: External ear normal.  Left Ear: External ear normal.  Nose:  Nose normal.  Mouth/Throat: Oropharynx is clear and moist.  Eyes: Conjunctivae and EOM are normal. Pupils are equal, round, and reactive to light.  Neck: Normal range of motion. Neck supple. No JVD present. No tracheal deviation present. No thyromegaly present.  Cardiovascular: Normal rate, regular rhythm, normal heart sounds and intact distal pulses.   No murmur heard. Pulmonary/Chest: Effort normal and breath sounds normal. She has no wheezes. She exhibits no tenderness.  Abdominal: Soft. Bowel sounds are normal.  Musculoskeletal: Normal range of motion. She exhibits no edema and no tenderness.  Lymphadenopathy:    She has no cervical adenopathy.  Neurological: She is alert and oriented to person, place, and time. She has normal reflexes. No cranial nerve deficit.  Skin: Skin is warm and dry. She is not diaphoretic.  Psychiatric: She has a normal mood and affect. Her behavior is normal.          Assessment & Plan:  Stable headache pattern of cluster type.  History of overlying hypertension stable blood pressures tolerating the Adderall for the ADHD without any reported problems weight stable   discussion of exercise and weight loss

## 2011-11-27 NOTE — Patient Instructions (Signed)
The patient is instructed to continue all medications as prescribed. Schedule followup with check out clerk upon leaving the clinic  

## 2012-01-29 ENCOUNTER — Other Ambulatory Visit: Payer: Self-pay | Admitting: Dermatology

## 2012-02-27 ENCOUNTER — Ambulatory Visit: Payer: BC Managed Care – PPO | Admitting: Internal Medicine

## 2012-03-24 ENCOUNTER — Other Ambulatory Visit: Payer: Self-pay | Admitting: Internal Medicine

## 2012-03-27 ENCOUNTER — Encounter: Payer: Self-pay | Admitting: Internal Medicine

## 2012-03-27 ENCOUNTER — Ambulatory Visit (INDEPENDENT_AMBULATORY_CARE_PROVIDER_SITE_OTHER): Payer: BC Managed Care – PPO | Admitting: Internal Medicine

## 2012-03-27 VITALS — BP 130/80 | HR 72 | Temp 98.2°F | Resp 16 | Ht 68.0 in | Wt 213.0 lb

## 2012-03-27 DIAGNOSIS — F909 Attention-deficit hyperactivity disorder, unspecified type: Secondary | ICD-10-CM

## 2012-03-27 MED ORDER — FLUTICASONE PROPIONATE 50 MCG/ACT NA SUSP: 2.0000 | Freq: Every day | NASAL | Status: DC

## 2012-03-27 MED ORDER — AMPHETAMINE-DEXTROAMPHETAMINE 20 MG PO TABS: 20.0000 mg | ORAL_TABLET | Freq: Two times a day (BID) | ORAL | Status: DC

## 2012-03-27 NOTE — Patient Instructions (Signed)
"  practical paleo" 

## 2012-03-27 NOTE — Progress Notes (Signed)
  Subjective:    Patient ID: Misty Benson, female    DOB: Nov 05, 1961, 51 y.o.   MRN: [id number]  HPI  Patient presents for refill of 80 medications she is doing well on [facility]. the medications increased ability to function and increased ability to manage her home and her weight is stable and reducing.  Blood pressure is not affected by medications he tolerates them well  Review of Systems  Constitutional: Negative for activity change, appetite change and fatigue.  HENT: Negative for ear pain, congestion, neck pain, postnasal drip and sinus pressure.   Eyes: Negative for redness and visual disturbance.  Respiratory: Negative for cough, shortness of breath and wheezing.   Gastrointestinal: Negative for abdominal pain and abdominal distention.  Genitourinary: Negative for dysuria, frequency and menstrual problem.  Musculoskeletal: Negative for myalgias, joint swelling and arthralgias.  Skin: Negative for rash and wound.  Neurological: Negative for dizziness, weakness and headaches.  Hematological: Negative for adenopathy. Does not bruise/bleed easily.  Psychiatric/Behavioral: Negative for sleep disturbance and decreased concentration.       Objective:   Physical Exam  Nursing note and vitals reviewed. Constitutional: She is oriented to person, place, and time. She appears well-developed and well-nourished. No distress.  HENT:  Head: Normocephalic and atraumatic.  Right Ear: External ear normal.  Left Ear: External ear normal.  Nose: Nose normal.  Mouth/Throat: Oropharynx is clear and moist.  Eyes: Conjunctivae and EOM are normal. Pupils are equal, round, and reactive to light.  Neck: Normal range of motion. Neck supple. No JVD present. No tracheal deviation present. No thyromegaly present.  Cardiovascular: Normal rate, regular rhythm, normal heart sounds and intact distal pulses.   No murmur heard. Pulmonary/Chest: Effort normal and breath sounds normal. She has no wheezes. She  exhibits no tenderness.  Abdominal: Soft. Bowel sounds are normal.  Musculoskeletal: Normal range of motion. She exhibits no edema and no tenderness.  Lymphadenopathy:    She has no cervical adenopathy.  Neurological: She is alert and oriented to person, place, and time. She has normal reflexes. No cranial nerve deficit.  Skin: Skin is warm and dry. She is not diaphoretic.  Psychiatric: She has a normal mood and affect. Her behavior is normal.          Assessment & Plan:  Refill of ADD medications after discussion of potential side effects and effects she appears stable on this dose and is doing well with the medications 90 day return office visit

## 2012-04-03 ENCOUNTER — Telehealth: Payer: Self-pay | Admitting: *Deleted

## 2012-04-03 MED ORDER — PREDNISONE 10 MG PO KIT: PACK | ORAL | Status: DC

## 2012-04-03 NOTE — Telephone Encounter (Signed)
Per dr Lovell Sheehan may have prednisone dose pack10mg  12 day

## 2012-04-03 NOTE — Telephone Encounter (Signed)
Rx sent to pharmacy. Left message on machine for patient.  

## 2012-04-03 NOTE — Telephone Encounter (Signed)
Patient is calling because she has a reaction to poison ivy.  She has spotting in random areas.  She would like to know if she can have a prescription of prednisone?  [facility] [place]

## 2012-05-08 ENCOUNTER — Other Ambulatory Visit: Payer: Self-pay | Admitting: Internal Medicine

## 2012-06-04 ENCOUNTER — Encounter: Payer: Self-pay | Admitting: Gynecology

## 2012-06-04 ENCOUNTER — Ambulatory Visit (INDEPENDENT_AMBULATORY_CARE_PROVIDER_SITE_OTHER): Payer: BC Managed Care – PPO | Admitting: Gynecology

## 2012-06-04 DIAGNOSIS — R1032 Left lower quadrant pain: Secondary | ICD-10-CM

## 2012-06-04 NOTE — Patient Instructions (Signed)
Try suppository like Dulcolax. His pain continues call and we will schedule GYN ultrasound. Ultimately may referred to gastroenterology if pain continues.

## 2012-06-04 NOTE — Progress Notes (Signed)
Patient presents with two-week history of left lower quadrant discomfort and bloating. She notes some constipation. No nausea vomiting diarrhea. No urinary symptoms such as frequency dysuria or urgency. No low back pain. No fevers chills, eating without difficulty. A colonoscopy screening within the past 2 years which was normal. Menses are basically once a month with some mild irregularity.  LMP 05/30/2012.  Exam with Selena Batten assistant Spine straight no CVA tenderness Abdomen active bowel sounds throughout mild left lower quadrant tenderness. No rebound guarding masses or organomegaly. Pelvic external BUS vagina normal. Cervix normal. Uterus normal size midline mobile nontender. Adnexa without masses or tenderness. Rectovaginal exam is normal.  Assessment and plan: Left lower quadrant discomfort. mild constipation.  Exam is normal. Recommend suppository for evacuation.  If pain continues she knows to call and we'll start with GYN ultrasound to rule out not palpable abnormalities. If negative and pain continues then we'll refer to gastroenterology. Check baseline UA today to rule out urinary contribution.

## 2012-06-05 LAB — URINALYSIS W MICROSCOPIC + REFLEX CULTURE
Bilirubin Urine: NEGATIVE
Casts: NONE SEEN
Glucose, UA: NEGATIVE mg/dL
Hgb urine dipstick: NEGATIVE
Ketones, ur: NEGATIVE mg/dL
Leukocytes, UA: NEGATIVE
Protein, ur: NEGATIVE mg/dL
pH: 7.5 (ref 5.0–8.0)

## 2012-06-08 ENCOUNTER — Telehealth: Payer: Self-pay | Admitting: *Deleted

## 2012-06-08 DIAGNOSIS — R1032 Left lower quadrant pain: Secondary | ICD-10-CM

## 2012-06-08 NOTE — Telephone Encounter (Signed)
Pt calling c/o left lower quadrant discomfort she tired suppository as directed oral and rectal. Felt somewhat better, still feeling bloating, pain is more in the stomach mid-line area. Pt is calling to follow up as directed. Order ultrasound?

## 2012-06-08 NOTE — Telephone Encounter (Signed)
Schedule ultrasound. Order placed

## 2012-06-08 NOTE — Telephone Encounter (Signed)
Pt informed with the below note, transferred to schedule.

## 2012-06-10 ENCOUNTER — Encounter: Payer: Self-pay | Admitting: Gynecology

## 2012-06-10 ENCOUNTER — Ambulatory Visit (INDEPENDENT_AMBULATORY_CARE_PROVIDER_SITE_OTHER): Payer: BC Managed Care – PPO | Admitting: Gynecology

## 2012-06-10 ENCOUNTER — Ambulatory Visit (INDEPENDENT_AMBULATORY_CARE_PROVIDER_SITE_OTHER): Payer: BC Managed Care – PPO

## 2012-06-10 DIAGNOSIS — R102 Pelvic and perineal pain: Secondary | ICD-10-CM

## 2012-06-10 DIAGNOSIS — D259 Leiomyoma of uterus, unspecified: Secondary | ICD-10-CM

## 2012-06-10 DIAGNOSIS — R1032 Left lower quadrant pain: Secondary | ICD-10-CM

## 2012-06-10 DIAGNOSIS — N949 Unspecified condition associated with female genital organs and menstrual cycle: Secondary | ICD-10-CM

## 2012-06-10 NOTE — Progress Notes (Signed)
Patient presents for ultrasound due to persistent left lower quadrant/pelvic pain. Patient ashiness her pain seems to be getting better and she has not had it for several days. When she called we asked her to schedule the ultrasound. Ultrasound today shows multiple small myomas. Overall uterine size is normal. Right left ovaries visualized and normal. Cul-de-sac negative.  Assessment and plan: I think her pain is probably GI related. Is now resolved we'll continue to monitor. If it does recur then I discussed seeing gastroenterology. Patient is due for her annual exam in October and she knows to schedule this we'll see how she's doing. Still having relatively regular menses. She'll keep a menstrual calendar as long as regular or less frequent but regular then we'll monitor. If significant irregularity then she knows to represent for evaluation.

## 2012-06-29 ENCOUNTER — Telehealth: Payer: Self-pay | Admitting: *Deleted

## 2012-06-29 NOTE — Telephone Encounter (Signed)
Okay for referral to GI? 

## 2012-06-29 NOTE — Telephone Encounter (Signed)
Office notes faxed to dr. Kinnie Scales office and they will contact Misty Benson and let our office know date and time. Misty Benson informed with this as well.

## 2012-06-29 NOTE — Telephone Encounter (Signed)
FYI. Pt is calling to follow up with OV 06/10/12, still complaining of pelvic pain. Pt found a GI doctor Dr.Medoff and would like referral to see him. Per office note okay to see GI. Referral would be made.

## 2012-07-06 NOTE — Telephone Encounter (Signed)
Pt was seen 07/03/12 @ 10:30 am. With dr. Kinnie Scales.

## 2012-07-22 ENCOUNTER — Encounter: Payer: Self-pay | Admitting: Internal Medicine

## 2012-07-22 ENCOUNTER — Ambulatory Visit (INDEPENDENT_AMBULATORY_CARE_PROVIDER_SITE_OTHER): Payer: BC Managed Care – PPO | Admitting: Internal Medicine

## 2012-07-22 VITALS — BP 144/80 | HR 76 | Temp 98.6°F | Resp 16 | Ht 68.0 in | Wt 212.0 lb

## 2012-07-22 DIAGNOSIS — I10 Essential (primary) hypertension: Secondary | ICD-10-CM

## 2012-07-22 DIAGNOSIS — E669 Obesity, unspecified: Secondary | ICD-10-CM

## 2012-07-22 DIAGNOSIS — F988 Other specified behavioral and emotional disorders with onset usually occurring in childhood and adolescence: Secondary | ICD-10-CM

## 2012-07-22 DIAGNOSIS — Z23 Encounter for immunization: Secondary | ICD-10-CM

## 2012-07-22 MED ORDER — AMPHETAMINE-DEXTROAMPHETAMINE 20 MG PO TABS: 20.0000 mg | ORAL_TABLET | Freq: Two times a day (BID) | ORAL | Status: DC

## 2012-07-22 NOTE — Progress Notes (Signed)
Subjective:    Patient ID: Misty Benson, female    DOB: 03/21/1961, 51 y.o.   MRN: [id number]  HPI Patient has developed constipation and is using miralax . She has seen GYN and GI for evalution. Constipation prone IBS? ADD stable Weight  And obesity play a major role as well as symptomatic climacteric changes   Review of Systems  Constitutional: Negative for activity change, appetite change and fatigue.  HENT: Negative for ear pain, congestion, neck pain, postnasal drip and sinus pressure.   Eyes: Negative for redness and visual disturbance.  Respiratory: Negative for cough, shortness of breath and wheezing.   Gastrointestinal: Negative for abdominal pain and abdominal distention.  Genitourinary: Negative for dysuria, frequency and menstrual problem.  Musculoskeletal: Negative for myalgias, joint swelling and arthralgias.  Skin: Negative for rash and wound.  Neurological: Negative for dizziness, weakness and headaches.  Hematological: Negative for adenopathy. Does not bruise/bleed easily.  Psychiatric/Behavioral: Negative for disturbed wake/sleep cycle and decreased concentration.   Past Medical History  Diagnosis Date  . Depression   . ADHD (attention deficit hyperactivity disorder)   . Obesity   . Hypertension   . Cancer     BASAL CELL FACE AND SHOULDERS , SQUAMOS CELL ON HAND AND MELANOMA LEFT ARM.Marland Kitchen.  . Cervix cancer 1987    microinvasive  . Leiomyoma 2013    multiple small    History   Social History  . Marital Status: Married    Spouse Name: N/A    Number of Children: N/A  . Years of Education: N/A   Occupational History  . Not on file.   Social History Main Topics  . Smoking status: Never Smoker   . Smokeless tobacco: Never Used  . Alcohol Use: Yes     WINE   . Drug Use: No  . Sexually Active: Yes -- Female partner(s)    Birth Control/ Protection: Surgical     PARTNER WITH VASECTOMY   Other Topics Concern  . Not on file   Social History Narrative    . No narrative on file    Past Surgical History  Procedure Date  . Tonsillectomy     as a child  . Bunionectomy     LEFT FOOT WITH PINS  . Cesarean section   . Dilation and curettage of uterus     x2  . Cervical cone biopsy 1987    Family History  Problem Relation Age of Onset  . Stroke Mother   . Osteoporosis Mother   . Hyperlipidemia Father   . Breast cancer Maternal Grandmother 80    No Known Allergies  Current Outpatient Prescriptions on File Prior to Visit  Medication Sig Dispense Refill  . ACETAMINOPHEN-BUTALBITAL 50-650 MG TABS Take 50-650 mg by mouth every 6 (six) hours as needed.  30 each  1  . calcium-vitamin D (OSCAL WITH D 500-200) 500-200 MG-UNIT per tablet Take 1 tablet by mouth daily.        . citalopram (CELEXA) 10 MG tablet TAKE ONE TABLET BY MOUTH ONE TIME DAILY  30 tablet  4  . fluticasone (FLONASE) 50 MCG/ACT nasal spray Place 2 sprays into the nose daily.  16 g  11  . hydrochlorothiazide (,MICROZIDE/HYDRODIURIL,) 12.5 MG capsule TAKE ONE CAPSULE BY MOUTH ONE TIME DAILY  30 capsule  10  . verapamil (CALAN-SR) 240 MG CR tablet TAKE ONE TABLET BY MOUTH ONE TIME DAILY  30 tablet  9  . DISCONTD: amphetamine-dextroamphetamine (ADDERALL) 20 MG tablet Take 1  tablet (20 mg total) by mouth 2 (two) times daily.  60 tablet  0    BP 144/80  Pulse 76  Temp 98.6 F (37 C)  Resp 16  Ht 5\' 8"  (1.727 m)  Wt 212 lb (96.163 kg)  BMI 32.23 kg/m2       Objective:   Physical Exam  Nursing note and vitals reviewed. Constitutional: She is oriented to person, place, and time. She appears well-developed and well-nourished. No distress.  HENT:  Head: Normocephalic and atraumatic.  Right Ear: External ear normal.  Left Ear: External ear normal.  Nose: Nose normal.  Mouth/Throat: Oropharynx is clear and moist.  Eyes: Conjunctivae normal and EOM are normal. Pupils are equal, round, and reactive to light.  Neck: Normal range of motion. Neck supple. No JVD present. No  tracheal deviation present. No thyromegaly present.  Cardiovascular: Normal rate, regular rhythm, normal heart sounds and intact distal pulses.   No murmur heard. Pulmonary/Chest: Effort normal and breath sounds normal. She has no wheezes. She exhibits no tenderness.  Abdominal: Soft. Bowel sounds are normal.  Musculoskeletal: Normal range of motion. She exhibits no edema and no tenderness.  Lymphadenopathy:    She has no cervical adenopathy.  Neurological: She is alert and oriented to person, place, and time. She has normal reflexes. No cranial nerve deficit.  Skin: Skin is warm and dry. She is not diaphoretic.  Psychiatric: She has a normal mood and affect. Her behavior is normal.        Assessment & Plan:  Obesity reviewed gluten free diet ADD medications management and refill discussion of constipation diet and stress

## 2012-07-23 ENCOUNTER — Other Ambulatory Visit: Payer: Self-pay | Admitting: Internal Medicine

## 2012-08-07 ENCOUNTER — Ambulatory Visit (INDEPENDENT_AMBULATORY_CARE_PROVIDER_SITE_OTHER): Payer: BC Managed Care – PPO | Admitting: Family

## 2012-08-07 ENCOUNTER — Encounter: Payer: Self-pay | Admitting: Family

## 2012-08-07 VITALS — BP 130/88 | HR 97 | Temp 98.4°F | Wt 216.0 lb

## 2012-08-07 DIAGNOSIS — H9209 Otalgia, unspecified ear: Secondary | ICD-10-CM

## 2012-08-07 DIAGNOSIS — H6120 Impacted cerumen, unspecified ear: Secondary | ICD-10-CM

## 2012-08-07 MED ORDER — NEOMYCIN-POLYMYXIN-HC 3.5-10000-1 OT SOLN: 3.0000 [drp] | Freq: Four times a day (QID) | OTIC | Status: DC

## 2012-08-07 NOTE — Addendum Note (Signed)
Addended byAdline Mango B on: 08/07/2012 01:35 PM   Modules accepted: Orders

## 2012-08-07 NOTE — Progress Notes (Signed)
Subjective:    Patient ID: Misty Benson, female    DOB: 1961/10/01, 51 y.o.   MRN: [id number]  HPI 51 year old white female is in today in with c/o decreased hearing and pain in the right ear. It has gotten worse within the right ear.    Review of Systems  Constitutional: Negative.   HENT: Positive for hearing loss and ear pain. Negative for congestion, sneezing, postnasal drip and sinus pressure.   Respiratory: Negative.   Cardiovascular: Negative.   Skin: Negative.   Hematological: Negative.   Psychiatric/Behavioral: Negative.    Past Medical History  Diagnosis Date  . Depression   . ADHD (attention deficit hyperactivity disorder)   . Obesity   . Hypertension   . Cancer     BASAL CELL FACE AND SHOULDERS , SQUAMOS CELL ON HAND AND MELANOMA LEFT ARM.Marland Kitchen.  . Cervix cancer 1987    microinvasive  . Leiomyoma 2013    multiple small    History   Social History  . Marital Status: Married    Spouse Name: N/A    Number of Children: N/A  . Years of Education: N/A   Occupational History  . Not on file.   Social History Main Topics  . Smoking status: Never Smoker   . Smokeless tobacco: Never Used  . Alcohol Use: Yes     WINE   . Drug Use: No  . Sexually Active: Yes -- Female partner(s)    Birth Control/ Protection: Surgical     PARTNER WITH VASECTOMY   Other Topics Concern  . Not on file   Social History Narrative  . No narrative on file    Past Surgical History  Procedure Date  . Tonsillectomy     as a child  . Bunionectomy     LEFT FOOT WITH PINS  . Cesarean section   . Dilation and curettage of uterus     x2  . Cervical cone biopsy 1987    Family History  Problem Relation Age of Onset  . Stroke Mother   . Osteoporosis Mother   . Hyperlipidemia Father   . Breast cancer Maternal Grandmother 48    No Known Allergies  Current Outpatient Prescriptions on File Prior to Visit  Medication Sig Dispense Refill  . ACETAMINOPHEN-BUTALBITAL 50-650 MG TABS  Take 50-650 mg by mouth every 6 (six) hours as needed.  30 each  1  . amphetamine-dextroamphetamine (ADDERALL) 20 MG tablet Take 1 tablet (20 mg total) by mouth 2 (two) times daily.  180 tablet  0  . calcium-vitamin D (OSCAL WITH D 500-200) 500-200 MG-UNIT per tablet Take 1 tablet by mouth daily.        . citalopram (CELEXA) 10 MG tablet TAKE ONE TABLET BY MOUTH ONE TIME DAILY  30 tablet  4  . fluticasone (FLONASE) 50 MCG/ACT nasal spray Place 2 sprays into the nose daily.  16 g  11  . hydrochlorothiazide (MICROZIDE) 12.5 MG capsule TAKE ONE CAPSULE BY MOUTH ONE TIME DAILY  90 capsule  9  . verapamil (CALAN-SR) 240 MG CR tablet TAKE ONE TABLET BY MOUTH ONE TIME DAILY  30 tablet  9    BP 130/88  Pulse 97  Temp 98.4 F (36.9 C) (Oral)  Wt 216 lb (97.977 kg)  SpO2 98%chart    Objective:   Physical Exam  Constitutional: She is oriented to person, place, and time. She appears well-developed and well-nourished.  HENT:  Nose: Nose normal.  Mouth/Throat: Oropharynx is  clear and moist.       Ears bilaterally impacted with cerumen.   Neck: Normal range of motion. Neck supple.  Cardiovascular: Normal rate, regular rhythm and normal heart sounds.   Pulmonary/Chest: Effort normal.  Neurological: She is alert and oriented to person, place, and time.  Skin: Skin is warm and dry.  Psychiatric: She has a normal mood and affect.    Informed consent was obtained and peroxide gel was inserted into the ears bilaterally using the lavage kit the ears were lavaged until clean.Inspection with a cerumen spoon removed residual wax. Patient tolerated the procedure well.      Assessment & Plan:  Assessment: Cerumen Impaction, Otalgia, Hearing Loss-temporarily  Plan: Recheck as scheduled and as needed sooner.

## 2012-08-07 NOTE — Patient Instructions (Addendum)
Cerumen Impaction  A cerumen impaction is when the wax in your ear forms a plug. This plug usually causes reduced hearing. Sometimes it also causes an earache or dizziness. Removing a cerumen impaction can be difficult and painful. The wax sticks to the ear canal. The canal is sensitive and bleeds easily. If you try to remove a heavy wax buildup with a cotton tipped swab, you may push it in further.  Irrigation with water, suction, and small ear curettes may be used to clear out the wax. If the impaction is fixed to the skin in the ear canal, ear drops may be needed for a few days to loosen the wax. People who build up a lot of wax frequently can use ear wax removal products available in your local drugstore.  SEEK MEDICAL CARE IF:    You develop an earache, increased hearing loss, or marked dizziness.  Document Released: 12/05/2004 Document Revised: 10/17/2011 Document Reviewed: 01/25/2010  [employer] Patient Information 2012 [employer].

## 2012-08-20 ENCOUNTER — Other Ambulatory Visit: Payer: Self-pay | Admitting: Gynecology

## 2012-08-20 DIAGNOSIS — Z1231 Encounter for screening mammogram for malignant neoplasm of breast: Secondary | ICD-10-CM

## 2012-08-21 ENCOUNTER — Other Ambulatory Visit: Payer: Self-pay | Admitting: Internal Medicine

## 2012-08-31 ENCOUNTER — Encounter: Payer: Self-pay | Admitting: Gynecology

## 2012-08-31 ENCOUNTER — Ambulatory Visit (INDEPENDENT_AMBULATORY_CARE_PROVIDER_SITE_OTHER): Payer: BC Managed Care – PPO | Admitting: Gynecology

## 2012-08-31 VITALS — BP 130/72 | Ht 68.0 in | Wt 219.0 lb

## 2012-08-31 DIAGNOSIS — Z01419 Encounter for gynecological examination (general) (routine) without abnormal findings: Secondary | ICD-10-CM

## 2012-08-31 DIAGNOSIS — N926 Irregular menstruation, unspecified: Secondary | ICD-10-CM

## 2012-08-31 NOTE — Patient Instructions (Signed)
Keep menstrual calendar. Follow up if significant abnormal bleeding or significant menopausal symptoms. Otherwise follow up in one year.

## 2012-08-31 NOTE — Progress Notes (Signed)
Misty Benson 10/29/1961 [id number]        51 y.o.  [id number] for annual exam.  Several issues noted below.  Past medical history,surgical history, medications, allergies, family history and social history were all reviewed and documented in the EPIC chart. ROS:  Was performed and pertinent positives and negatives are included in the history.  Exam: [name] Rist assistant Filed Vitals:   08/31/12 1129  BP: 130/72  Height: 5\' 8"  (1.727 m)  Weight: 219 lb (99.338 kg)   General appearance  Normal Skin grossly normal Head/Neck normal with no cervical or supraclavicular adenopathy thyroid normal Lungs  clear Cardiac RR, without RMG Abdominal  soft, nontender, without masses, organomegaly or hernia Breasts  examined lying and sitting without masses, retractions, discharge or axillary adenopathy. Pelvic  Ext/BUS/vagina  normal   Cervix  normal   Uterus  anteverted, normal size, shape and contour, midline and mobile nontender   Adnexa  Without masses or tenderness    Anus and perineum  normal   Rectovaginal  normal sphincter tone without palpated masses or tenderness. Mild rectocele.   Assessment/Plan:  51 y.o. Misty Benson female for annual exam.   1. Menstrual regularity. Patient's last menstrual period July. Had some menstrual irregularity before which she would vary up to several weeks without menses. They are regular when they come. Having some hot flashes and sweats. Reviewed options to include HRT. Patient wants to observe at present. Did not feel hormonal baseline needed at age 51 given overall picture. Patient will keep menstrual calendar as long as less frequent but regular menses when they occur we'll monitor period of prolonged or atypical bleeding she knows to represent for evaluation. If significant hot flashes/night sweats will represent to discuss treatment options. 2. GI symptoms. Patient still having some bouts of diarrhea. Had seen Dr. Kinnie Scales and was treated with MiraLAX for  constipation. We'll follow up with him if GI issues continue.  She recently had colonoscopy through his office 3. Pap smear. No Pap smear done today. Last Pap smear 2012. Does have history of microinvasion 1987 status post cone biopsy with negative regular Pap smears since then. Options for screening to include annual, every other year every 3-5 years all reviewed. At this point we'll plan every other year screening and she agrees with this. 4. Mammography. Patient due now she knows to schedule this. SBE monthly reviewed. 5. Health maintenance. No blood work was done today this is all done through her primary physician's office who follows her for her medical issues. She is actually due to see them and will call make an appointment. Follow up with me in a year, sooner if abnormal bleeding or menopausal symptoms worsen.    Dara Lords MD, 12:42 PM 08/31/2012

## 2012-09-23 ENCOUNTER — Ambulatory Visit
Admission: RE | Admit: 2012-09-23 | Discharge: 2012-09-23 | Disposition: A | Discharge status: Home / Self Care | Payer: BC Managed Care – PPO | Source: Ambulatory Visit | Attending: Gynecology | Admitting: Gynecology

## 2012-09-23 DIAGNOSIS — Z1231 Encounter for screening mammogram for malignant neoplasm of breast: Secondary | ICD-10-CM

## 2012-10-03 ENCOUNTER — Ambulatory Visit (INDEPENDENT_AMBULATORY_CARE_PROVIDER_SITE_OTHER): Payer: BC Managed Care – PPO | Admitting: Family Medicine

## 2012-10-03 VITALS — BP 130/70 | HR 73 | Temp 97.6°F | Resp 16 | Wt 217.8 lb

## 2012-10-03 DIAGNOSIS — M549 Dorsalgia, unspecified: Secondary | ICD-10-CM

## 2012-10-03 MED ORDER — CYCLOBENZAPRINE HCL 10 MG PO TABS: 10.0000 mg | ORAL_TABLET | Freq: Every day | ORAL | Status: DC

## 2012-10-03 NOTE — Progress Notes (Signed)
Patient Name: ILYA ESS Date of Birth: 1961/07/10 Medical Record Number: [id number] Gender: female  PCP: Carrie Mew, MD  History of Present Illness:  AUNDREA HORACE is a 51 y.o. very pleasant female patient who presents with the following: Back Pain  ongoing for approximately: 1 day The patient has had back pain before. Chronic for decades off and on The back pain is localized into the lumbar spine area. They also describe no radiculopathy. Some msk pain in thoracic  Originally, had some pain with back off and on since she was a child, and  Has been painting and doing stuff around the house.  No one thing, was a little stiff, then it has been getting worse. Worst that it has been in a long time.  Hard to get out of bed.  Has chiropractor, and met him at his office -- done today.  Starts on the R, moves to the left.   No numbness or tingling. No bowel or bladder incontinence. No focal weakness. Prior interventions: manipulation, heat, nsaids Physical therapy: No Chiropractic manipulations: y Acupuncture: No Heat or cold: helps some  Patient Active Problem List  Diagnosis  . MALIGNANT MELANOMA OTHER SPECIFIED SITES SKIN  . OBESITY NOS  . DEPRESSION  . DISORDER, ATTENTION DEFICIT W/O HYPERACTIVITY  . HEADACHE, CLUSTER  . HYPERTENSION, SYSTOLIC, BORDERLINE  . SINUSITIS, CHRONIC  . SYMPTOMATIC MENOPAUSAL/FEMALE CLIMACTERIC STATES  . BACK PAIN, CHRONIC, INTERMITTENT  . SNORING  . Plantar fasciitis  . Hypertension  . Acute back pain    Past Medical History  Diagnosis Date  . Depression   . ADHD (attention deficit hyperactivity disorder)   . Obesity   . Hypertension   . Cancer     BASAL CELL FACE AND SHOULDERS , SQUAMOS CELL ON HAND AND MELANOMA LEFT ARM.Marland Kitchen.  . Cervix cancer 1987    microinvasive  . Leiomyoma 2013    multiple small    Past Surgical History  Procedure Date  . Tonsillectomy     as a child  . Bunionectomy     LEFT FOOT WITH PINS    . Cesarean section   . Dilation and curettage of uterus     x2  . Cervical cone biopsy 1987  . Arm surgery     to remove melanoma on left upper outter arm    History  Substance Use Topics  . Smoking status: Never Smoker   . Smokeless tobacco: Never Used  . Alcohol Use: Yes     Comment: WINE     Family History  Problem Relation Age of Onset  . Stroke Mother   . Osteoporosis Mother   . Hyperlipidemia Father   . Breast cancer Maternal Grandmother 69    No Known Allergies  Current Outpatient Prescriptions on File Prior to Visit  Medication Sig Dispense Refill  . ACETAMINOPHEN-BUTALBITAL 50-650 MG TABS Take 50-650 mg by mouth every 6 (six) hours as needed.  30 each  1  . amphetamine-dextroamphetamine (ADDERALL) 20 MG tablet Take 1 tablet (20 mg total) by mouth 2 (two) times daily.  180 tablet  0  . calcium-vitamin D (OSCAL WITH D 500-200) 500-200 MG-UNIT per tablet Take 1 tablet by mouth daily.        . citalopram (CELEXA) 10 MG tablet TAKE ONE TABLET BY MOUTH ONE TIME DAILY  30 tablet  3  . fluticasone (FLONASE) 50 MCG/ACT nasal spray Place 2 sprays into the nose daily.  16 g  11  .  hydrochlorothiazide (MICROZIDE) 12.5 MG capsule TAKE ONE CAPSULE BY MOUTH ONE TIME DAILY  90 capsule  9  . Polyethylene Glycol 3350 (MIRALAX PO) Take by mouth.      . verapamil (CALAN-SR) 240 MG CR tablet TAKE ONE TABLET BY MOUTH ONE TIME DAILY  30 tablet  9     Past Medical History, Surgical History, Family History, Medications, Allergies have been reviewed and updated if relevant.  Review of Systems  GEN: No fevers, chills. Nontoxic. Primarily MSK c/o today. MSK: Detailed in the HPI GI: tolerating PO intake without difficulty Neuro: As above  Otherwise the pertinent positives of the ROS are noted above.    Physical Exam  Filed Vitals:   10/03/12 1155  BP: 130/70  Pulse: 73  Temp: 97.6 F (36.4 C)  TempSrc: Oral  Resp: 16  Weight: 217 lb 12 oz (98.771 kg)  SpO2: 99%    Gen:  Well-developed,well-nourished,in no acute distress; alert,appropriate and cooperative throughout examination HEENT: Normocephalic and atraumatic without obvious abnormalities.  Ears, externally no deformities Pulm: Breathing comfortably in no respiratory distress Range of motion at  the waist: Flexion, rotation and lateral bending: no ext, minimal lateral bending, slow forward flexion, full  No echymosis or edema Rises to examination table with no difficulty Gait: minimally antalgic  Inspection/Deformity: No abnormality Paraspinus T:  Mild l3-s1  B Ankle Dorsiflexion (L5,4): 5/5 B Great Toe Dorsiflexion (L5,4): 5/5 Heel Walk (L5): WNL Toe Walk (S1): WNL Rise/Squat (L4): WNL, mild pain  SENSORY B Medial Foot (L4): WNL B Dorsum (L5): WNL B Lateral (S1): WNL Light Touch: WNL  REFLEXES Knee (L4): 2+ Ankle (S1): 2+  B SLR, seated: neg B SLR, supine: neg B FABER: neg B Reverse FABER: neg B Greater Troch: NT B Log Roll: neg B Sciatic Notch: NT   Assessment and Plan:  Back pain  I reviewed with the patient the structures involved and how they related to diagnosis.   Conservative algorithms for acute back pain generally begin with the following: NSAIDS Muscle Relaxants Mild pain medication - did not want tramadol  Start with medications, core rehab, and progress from there following low back pain algorithm. No red flags are present.   1. Acute back pain   2. BACK PAIN, CHRONIC, INTERMITTENT    Updated Medication List: (Includes new medications, updates to list, dose adjustments) Meds ordered this encounter  Medications  . cyclobenzaprine (FLEXERIL) 10 MG tablet    Sig: Take 1 tablet (10 mg total) by mouth at bedtime.    Dispense:  30 tablet    Refill:  2

## 2012-12-09 ENCOUNTER — Encounter: Payer: Self-pay | Admitting: Family Medicine

## 2012-12-09 ENCOUNTER — Ambulatory Visit: Payer: BC Managed Care – PPO | Admitting: Family

## 2012-12-09 ENCOUNTER — Ambulatory Visit (INDEPENDENT_AMBULATORY_CARE_PROVIDER_SITE_OTHER): Payer: BC Managed Care – PPO | Admitting: Family Medicine

## 2012-12-09 VITALS — BP 154/92 | HR 76 | Temp 99.1°F | Wt 219.0 lb

## 2012-12-09 DIAGNOSIS — R0981 Nasal congestion: Secondary | ICD-10-CM

## 2012-12-09 DIAGNOSIS — J329 Chronic sinusitis, unspecified: Secondary | ICD-10-CM

## 2012-12-09 DIAGNOSIS — J3489 Other specified disorders of nose and nasal sinuses: Secondary | ICD-10-CM

## 2012-12-09 MED ORDER — AMOXICILLIN 875 MG PO TABS: 875.0000 mg | ORAL_TABLET | Freq: Two times a day (BID) | ORAL | Status: DC

## 2012-12-09 NOTE — Progress Notes (Signed)
Chief Complaint  Patient presents with  . chest congestion    x 2 weeks    HPI:  Acute visit for URI: -started: 2 weeks ago -symptoms:nasal congestion, cough, drainage at night, sinus pain frontal a little -denies:fever, SOB, NVD, tooth pain, ear pain -has tried: has been using oxymetazoline for months and knows she shouldn't use, nasal saline -sick contacts: son is sick with a cold -Hx of: mild AR - has tried claritin, has felt flonase has helped some too, has hx of sinus infection  ROS: See pertinent positives and negatives per HPI.  Past Medical History  Diagnosis Date  . Depression   . ADHD (attention deficit hyperactivity disorder)   . Obesity   . Hypertension   . Cancer     BASAL CELL FACE AND SHOULDERS , SQUAMOS CELL ON HAND AND MELANOMA LEFT ARM.Marland Kitchen.  . Cervix cancer 1987    microinvasive  . Leiomyoma 2013    multiple small    Family History  Problem Relation Age of Onset  . Stroke Mother   . Osteoporosis Mother   . Hyperlipidemia Father   . Breast cancer Maternal Grandmother 66    History   Social History  . Marital Status: Married    Spouse Name: N/A    Number of Children: N/A  . Years of Education: N/A   Social History Main Topics  . Smoking status: Never Smoker   . Smokeless tobacco: Never Used  . Alcohol Use: Yes     Comment: WINE   . Drug Use: No  . Sexually Active: Yes -- Female partner(s)    Birth Control/ Protection: Surgical     Comment: PARTNER WITH VASECTOMY   Other Topics Concern  . None   Social History Narrative  . None    Current outpatient prescriptions:ACETAMINOPHEN-BUTALBITAL 50-650 MG TABS, Take 50-650 mg by mouth every 6 (six) hours as needed., Disp: 30 each, Rfl: 1;  amphetamine-dextroamphetamine (ADDERALL) 20 MG tablet, Take 1 tablet (20 mg total) by mouth 2 (two) times daily., Disp: 180 tablet, Rfl: 0;  calcium-vitamin D (OSCAL WITH D 500-200) 500-200 MG-UNIT per tablet, Take 1 tablet by mouth daily.  , Disp: , Rfl:    citalopram (CELEXA) 10 MG tablet, TAKE ONE TABLET BY MOUTH ONE TIME DAILY, Disp: 30 tablet, Rfl: 3;  cyclobenzaprine (FLEXERIL) 10 MG tablet, Take 1 tablet (10 mg total) by mouth at bedtime., Disp: 30 tablet, Rfl: 2;  fluticasone (FLONASE) 50 MCG/ACT nasal spray, Place 2 sprays into the nose daily., Disp: 16 g, Rfl: 11;  hydrochlorothiazide (MICROZIDE) 12.5 MG capsule, TAKE ONE CAPSULE BY MOUTH ONE TIME DAILY, Disp: 90 capsule, Rfl: 9 Polyethylene Glycol 3350 (MIRALAX PO), Take by mouth., Disp: , Rfl: ;  verapamil (CALAN-SR) 240 MG CR tablet, TAKE ONE TABLET BY MOUTH ONE TIME DAILY, Disp: 30 tablet, Rfl: 9;  amoxicillin (AMOXIL) 875 MG tablet, Take 1 tablet (875 mg total) by mouth 2 (two) times daily., Disp: 20 tablet, Rfl: 0  EXAM:  Filed Vitals:   12/09/12 1057  BP: 154/92  Pulse: 76  Temp: 99.1 F (37.3 C)    There is no height on file to calculate BMI.  GENERAL: vitals reviewed and listed above, alert, oriented, appears well hydrated and in no acute distress  HEENT: atraumatic, conjunttiva clear, no obvious abnormalities on inspection of external nose and ears, normal appearance of ear canals and TMs, white nasal congestion, swollen red nasal turbinates, mild post oropharyngeal erythema with PND and cobblestoning, no tonsillar edema  or exudate, no sinus TTP  NECK: no obvious masses on inspection  LUNGS: clear to auscultation bilaterally, no wheezes, rales or rhonchi, good air movement  CV: HRRR, no peripheral edema  MS: moves all extremities without noticeable abnormality  PSYCH: pleasant and cooperative, no obvious depression or anxiety  ASSESSMENT AND PLAN:  Discussed the following assessment and plan:  1. Sinusitis  amoxicillin (AMOXIL) 875 MG tablet  2. Nasal sinus congestion     -possible bacterial sinusitis and AR and rebound congestion from overuse of nasal decongestant -Patient advised to return or notify a doctor immediately if symptoms worsen or persist or new  concerns arise.  Patient Instructions  -STOP USING THE NASAL DECONGESTANT (OXYMETAZOLINE)  -As we discussed, we have prescribed a new medication (AMOXICILLIN) for you at this appointment. We discussed the common and serious potential adverse effects of this medication and you can review these and more with the pharmacist when you pick up your medication.  Please follow the instructions for use carefully and notify us immediately if you have any problems taking this medication.  -use flonase 2 sprays each nostril daily for 1 month  -nasal saline and a humidifier are helpful, also ensure drinking plenty of fluids  -follow up in 1 month or sooner if concerns      Lavana Huckeba R.

## 2012-12-09 NOTE — Patient Instructions (Addendum)
-  STOP USING THE NASAL DECONGESTANT (OXYMETAZOLINE)  -As we discussed, we have prescribed a new medication (AMOXICILLIN) for you at this appointment. We discussed the common and serious potential adverse effects of this medication and you can review these and more with the pharmacist when you pick up your medication.  Please follow the instructions for use carefully and notify us immediately if you have any problems taking this medication.  -use flonase 2 sprays each nostril daily for 1 month  -nasal saline and a humidifier are helpful, also ensure drinking plenty of fluids  -follow up in 1 month or sooner if concerns

## 2012-12-10 ENCOUNTER — Telehealth: Payer: Self-pay | Admitting: Internal Medicine

## 2012-12-10 MED ORDER — AMPHETAMINE-DEXTROAMPHETAMINE 20 MG PO TABS: 20.0000 mg | ORAL_TABLET | Freq: Two times a day (BID) | ORAL | Status: DC

## 2012-12-10 NOTE — Telephone Encounter (Signed)
Ok per Dr Lovell Sheehan, pt can p/u rx tomorrow.  Pt aware

## 2012-12-10 NOTE — Telephone Encounter (Signed)
Patient called stating that she need a refill of her adderall 20mg  1po bid for 90 days. Please assist.

## 2013-02-08 ENCOUNTER — Other Ambulatory Visit: Payer: Self-pay | Admitting: Internal Medicine

## 2013-03-12 ENCOUNTER — Other Ambulatory Visit: Payer: Self-pay | Admitting: Internal Medicine

## 2013-03-15 ENCOUNTER — Other Ambulatory Visit (INDEPENDENT_AMBULATORY_CARE_PROVIDER_SITE_OTHER): Payer: BC Managed Care – PPO

## 2013-03-15 DIAGNOSIS — Z0000 Encounter for general adult medical examination without abnormal findings: Secondary | ICD-10-CM

## 2013-03-15 LAB — CBC WITH DIFFERENTIAL/PLATELET
Basophils Absolute: 0 10*3/uL (ref 0.0–0.1)
Eosinophils Relative: 2.4 % (ref 0.0–5.0)
Hemoglobin: 13 g/dL (ref 12.0–15.0)
Lymphocytes Relative: 22.2 % (ref 12.0–46.0)
Monocytes Relative: 9.9 % (ref 3.0–12.0)
Neutro Abs: 3.7 10*3/uL (ref 1.4–7.7)
Platelets: 263 10*3/uL (ref 150.0–400.0)
RDW: 13.5 % (ref 11.5–14.6)
WBC: 5.8 10*3/uL (ref 4.5–10.5)

## 2013-03-15 LAB — POCT URINALYSIS DIPSTICK
Bilirubin, UA: NEGATIVE
Blood, UA: NEGATIVE
Glucose, UA: NEGATIVE
Ketones, UA: NEGATIVE
Spec Grav, UA: 1.025

## 2013-03-16 LAB — HEPATIC FUNCTION PANEL
ALT: 21 U/L (ref 0–35)
AST: 20 U/L (ref 0–37)
Alkaline Phosphatase: 54 U/L (ref 39–117)
Total Bilirubin: 0.6 mg/dL (ref 0.3–1.2)

## 2013-03-16 LAB — LIPID PANEL
HDL: 73.5 mg/dL (ref >39.00)
LDL Cholesterol: 86 mg/dL (ref 0–99)
Total CHOL/HDL Ratio: 2
Triglycerides: 49 mg/dL (ref 0.0–149.0)
VLDL: 9.8 mg/dL (ref 0.0–40.0)

## 2013-03-16 LAB — BASIC METABOLIC PANEL
Calcium: 8.9 mg/dL (ref 8.4–10.5)
GFR: 91.66 mL/min (ref >60.00)
Glucose, Bld: 78 mg/dL (ref 70–99)
Potassium: 4.2 mEq/L (ref 3.5–5.1)
Sodium: 141 mEq/L (ref 135–145)

## 2013-03-17 ENCOUNTER — Other Ambulatory Visit: Payer: BC Managed Care – PPO

## 2013-03-22 ENCOUNTER — Telehealth: Payer: Self-pay | Admitting: Internal Medicine

## 2013-03-22 NOTE — Telephone Encounter (Signed)
Pt had cpe on wed. 5/14. Pt cannot come in on Thurs, 5/15 for the resc bump, unless Dr Lovell Sheehan can see her at 1pm. Pt has prior obligation. Pt would rather not wait until September 06, 2013 which is first available CPE. Pls advise!!

## 2013-03-22 NOTE — Telephone Encounter (Signed)
PER DR Lovell Sheehan. SHE CAN COME IN ON MAY 21 AT 12 NOON

## 2013-03-22 NOTE — Telephone Encounter (Signed)
appt set

## 2013-03-24 ENCOUNTER — Encounter: Payer: BC Managed Care – PPO | Admitting: Internal Medicine

## 2013-03-31 ENCOUNTER — Encounter: Payer: Self-pay | Admitting: Internal Medicine

## 2013-03-31 ENCOUNTER — Ambulatory Visit (INDEPENDENT_AMBULATORY_CARE_PROVIDER_SITE_OTHER): Payer: BC Managed Care – PPO | Admitting: Internal Medicine

## 2013-03-31 VITALS — BP 144/84 | HR 72 | Temp 98.6°F | Resp 16 | Ht 68.0 in | Wt 216.0 lb

## 2013-03-31 DIAGNOSIS — Z0000 Encounter for general adult medical examination without abnormal findings: Secondary | ICD-10-CM

## 2013-03-31 MED ORDER — AMPHETAMINE-DEXTROAMPHETAMINE 20 MG PO TABS: 20.0000 mg | ORAL_TABLET | Freq: Two times a day (BID) | ORAL | Status: DC

## 2013-03-31 NOTE — Patient Instructions (Signed)
The patient is instructed to continue all medications as prescribed. Schedule followup with check out clerk upon leaving the clinic  

## 2013-03-31 NOTE — Progress Notes (Signed)
Subjective:    Patient ID: Misty Benson, female    DOB: 06-May-1961, 52 y.o.   MRN: [id number]  HPI Obesity ADD CPX   Review of Systems  Constitutional: Negative for activity change, appetite change and fatigue.  HENT: Negative for ear pain, congestion, neck pain, postnasal drip and sinus pressure.   Eyes: Negative for redness and visual disturbance.  Respiratory: Negative for cough, shortness of breath and wheezing.   Gastrointestinal: Negative for abdominal pain and abdominal distention.  Genitourinary: Negative for dysuria, frequency and menstrual problem.  Musculoskeletal: Negative for myalgias, joint swelling and arthralgias.  Skin: Negative for rash and wound.  Neurological: Negative for dizziness, weakness and headaches.  Hematological: Negative for adenopathy. Does not bruise/bleed easily.  Psychiatric/Behavioral: Negative for sleep disturbance and decreased concentration.   Past Medical History  Diagnosis Date  . Depression   . ADHD (attention deficit hyperactivity disorder)   . Obesity   . Hypertension   . Cancer     BASAL CELL FACE AND SHOULDERS , SQUAMOS CELL ON HAND AND MELANOMA LEFT ARM.Marland Kitchen.  . Cervix cancer 1987    microinvasive  . Leiomyoma 2013    multiple small    History   Social History  . Marital Status: Married    Spouse Name: N/A    Number of Children: N/A  . Years of Education: N/A   Occupational History  . Not on file.   Social History Main Topics  . Smoking status: Never Smoker   . Smokeless tobacco: Never Used  . Alcohol Use: Yes     Comment: WINE   . Drug Use: No  . Sexually Active: Yes -- Female partner(s)    Birth Control/ Protection: Surgical     Comment: PARTNER WITH VASECTOMY   Other Topics Concern  . Not on file   Social History Narrative  . No narrative on file    Past Surgical History  Procedure Laterality Date  . Tonsillectomy      as a child  . Bunionectomy      LEFT FOOT WITH PINS  . Cesarean section    .  Dilation and curettage of uterus      x2  . Cervical cone biopsy  1987  . Arm surgery      to remove melanoma on left upper outter arm    Family History  Problem Relation Age of Onset  . Stroke Mother   . Osteoporosis Mother   . Hyperlipidemia Father   . Breast cancer Maternal Grandmother 19    No Known Allergies  Current Outpatient Prescriptions on File Prior to Visit  Medication Sig Dispense Refill  . ACETAMINOPHEN-BUTALBITAL 50-650 MG TABS Take 50-650 mg by mouth every 6 (six) hours as needed.  30 each  1  . calcium-vitamin D (OSCAL WITH D 500-200) 500-200 MG-UNIT per tablet Take 1 tablet by mouth daily.        . citalopram (CELEXA) 10 MG tablet TAKE ONE TABLET BY MOUTH ONE TIME DAILY  30 tablet  2  . fluticasone (FLONASE) 50 MCG/ACT nasal spray Place 2 sprays into the nose daily.  16 g  11  . hydrochlorothiazide (MICROZIDE) 12.5 MG capsule TAKE ONE CAPSULE BY MOUTH ONE TIME DAILY  90 capsule  9  . Polyethylene Glycol 3350 (MIRALAX PO) Take by mouth.      . verapamil (CALAN-SR) 240 MG CR tablet TAKE ONE TABLET BY MOUTH ONE TIME DAILY  30 tablet  0   No current facility-administered medications  on file prior to visit.    BP 144/84  Pulse 72  Temp(Src) 98.6 F (37 C)  Resp 16  Ht 5\' 8"  (1.727 m)  Wt 216 lb (97.977 kg)  BMI 32.85 kg/m2       Objective:   Physical Exam  Constitutional: She is oriented to person, place, and time. She appears well-developed and well-nourished. No distress.  HENT:  Head: Normocephalic and atraumatic.  Right Ear: External ear normal.  Left Ear: External ear normal.  Nose: Nose normal.  Mouth/Throat: Oropharynx is clear and moist.  Eyes: Conjunctivae and EOM are normal. Pupils are equal, round, and reactive to light.  Neck: Normal range of motion. Neck supple. No JVD present. No tracheal deviation present. No thyromegaly present.  Cardiovascular: Normal rate, regular rhythm, normal heart sounds and intact distal pulses.   No murmur  heard. Pulmonary/Chest: Effort normal and breath sounds normal. She has no wheezes. She exhibits no tenderness.  Abdominal: Soft. Bowel sounds are normal.  Musculoskeletal: Normal range of motion. She exhibits no edema and no tenderness.  Lymphadenopathy:    She has no cervical adenopathy.  Neurological: She is alert and oriented to person, place, and time. She has normal reflexes. No cranial nerve deficit.  Skin: Skin is warm and dry. She is not diaphoretic.  Psychiatric: She has a normal mood and affect. Her behavior is normal.          Assessment & Plan:   This is a routine physical examination for this healthy  Female. Reviewed all health maintenance protocols including mammography colonoscopy bone density and reviewed appropriate screening labs. Her immunization history was reviewed as well as her current medications and allergies refills of her chronic medications were given and the plan for yearly health maintenance was discussed all orders and referrals were made as appropriate.

## 2013-04-01 ENCOUNTER — Other Ambulatory Visit: Payer: Self-pay | Admitting: *Deleted

## 2013-04-01 MED ORDER — LEVOFLOXACIN 500 MG PO TABS: 500.0000 mg | ORAL_TABLET | Freq: Every day | ORAL | Status: DC

## 2013-04-02 ENCOUNTER — Telehealth: Payer: Self-pay | Admitting: Internal Medicine

## 2013-04-02 MED ORDER — AZITHROMYCIN 250 MG PO TABS: ORAL_TABLET | ORAL | Status: DC

## 2013-04-02 NOTE — Telephone Encounter (Signed)
May have z pack per dr Lovell Sheehan   Left message on machine fpr [t-to [facility]

## 2013-04-02 NOTE — Telephone Encounter (Signed)
Patient is calling in regards to antibiotic that was called in for sinus infection.  Patient states that when she picked up the Levaquin it had a warning that it can cause tendon rupture.  Patient states that she just had a ligament repair from where she had a complete tear in left foot and does not feel that it is safe to take the medication due to this reason.  Patient would like to know if a alternative medication can be called in that would not have this effect.  Patient states that she has also read that Cipro has the same risk and would like to avoid that medication as well.  Patient has done saline spray for sinuses and it is still progressive and not improving on their own.  Preferred pharmacy [facility] on [address]  OFFICE NOTE: PLEASE FOLLOW UP WITH PATIENT ON MEDICATION CHANGE REQUEST.

## 2013-04-16 ENCOUNTER — Other Ambulatory Visit: Payer: Self-pay | Admitting: Internal Medicine

## 2013-04-26 ENCOUNTER — Other Ambulatory Visit: Payer: Self-pay | Admitting: Internal Medicine

## 2013-06-28 ENCOUNTER — Other Ambulatory Visit: Payer: Self-pay | Admitting: *Deleted

## 2013-06-28 MED ORDER — VERAPAMIL HCL ER 240 MG PO TBCR: EXTENDED_RELEASE_TABLET | ORAL | Status: DC

## 2013-06-28 MED ORDER — CITALOPRAM HYDROBROMIDE 10 MG PO TABS: ORAL_TABLET | ORAL | Status: DC

## 2013-06-29 ENCOUNTER — Telehealth: Payer: Self-pay | Admitting: Internal Medicine

## 2013-06-29 MED ORDER — AMPHETAMINE-DEXTROAMPHETAMINE 20 MG PO TABS: 20.0000 mg | ORAL_TABLET | Freq: Two times a day (BID) | ORAL | Status: DC

## 2013-06-29 NOTE — Telephone Encounter (Signed)
Printed and will call pt to pick up after dr Lovell Sheehan sing tomorrow

## 2013-06-29 NOTE — Telephone Encounter (Signed)
PT is calling to request a 3 month supply of her amphetamine-dextroamphetamine (ADDERALL) 20 MG tablet. Please assist.

## 2013-08-04 ENCOUNTER — Ambulatory Visit (INDEPENDENT_AMBULATORY_CARE_PROVIDER_SITE_OTHER): Payer: BC Managed Care – PPO

## 2013-08-04 DIAGNOSIS — Z23 Encounter for immunization: Secondary | ICD-10-CM

## 2013-08-13 ENCOUNTER — Ambulatory Visit: Payer: Self-pay | Admitting: Podiatrist

## 2013-08-25 ENCOUNTER — Ambulatory Visit: Payer: BC Managed Care – PPO

## 2013-09-01 ENCOUNTER — Encounter: Payer: Self-pay | Admitting: Gynecology

## 2013-09-22 ENCOUNTER — Encounter: Payer: Self-pay | Admitting: Gynecology

## 2013-09-30 ENCOUNTER — Encounter: Payer: Self-pay | Admitting: *Deleted

## 2013-10-01 ENCOUNTER — Ambulatory Visit (INDEPENDENT_AMBULATORY_CARE_PROVIDER_SITE_OTHER): Payer: BC Managed Care – PPO | Admitting: Internal Medicine

## 2013-10-01 ENCOUNTER — Encounter: Payer: Self-pay | Admitting: Internal Medicine

## 2013-10-01 VITALS — BP 140/80 | HR 76 | Temp 98.2°F | Resp 16 | Ht 67.0 in | Wt 224.0 lb

## 2013-10-01 DIAGNOSIS — F988 Other specified behavioral and emotional disorders with onset usually occurring in childhood and adolescence: Secondary | ICD-10-CM

## 2013-10-01 DIAGNOSIS — J329 Chronic sinusitis, unspecified: Secondary | ICD-10-CM

## 2013-10-01 DIAGNOSIS — G44009 Cluster headache syndrome, unspecified, not intractable: Secondary | ICD-10-CM

## 2013-10-01 DIAGNOSIS — Z23 Encounter for immunization: Secondary | ICD-10-CM

## 2013-10-01 MED ORDER — CEFUROXIME AXETIL 250 MG PO TABS: 250.0000 mg | ORAL_TABLET | Freq: Two times a day (BID) | ORAL | Status: DC

## 2013-10-01 MED ORDER — AMPHETAMINE-DEXTROAMPHETAMINE 20 MG PO TABS: 20.0000 mg | ORAL_TABLET | Freq: Two times a day (BID) | ORAL | Status: DC

## 2013-10-01 NOTE — Progress Notes (Signed)
Pre-visit discussion using our clinic review tool. No additional management support is needed unless otherwise documented below in the visit note.  

## 2013-10-01 NOTE — Patient Instructions (Signed)
The patient is instructed to continue all medications as prescribed. Schedule followup with check out clerk upon leaving the clinic  

## 2013-10-01 NOTE — Progress Notes (Signed)
Subjective:    Patient ID: Misty Benson, female    DOB: 1961/10/11, 52 y.o.   MRN: [id number]  Hypertension Associated symptoms include headaches. Pertinent negatives include no neck pain or shortness of breath.  Back Pain Associated symptoms include headaches. Pertinent negatives include no abdominal pain, dysuria or weakness.  Headache  Associated symptoms include back pain, rhinorrhea and sinus pressure. Pertinent negatives include no abdominal pain, coughing, dizziness, ear pain, eye redness, neck pain or weakness. Her past medical history is significant for hypertension.   Completed ADD questions Still distracted and procrastinate Has persistent cluster head aches and these are complicated by allergies and sinus pressue Generally pattern is better   Review of Systems  Constitutional: Negative for activity change, appetite change and fatigue.  HENT: Positive for rhinorrhea and sinus pressure. Negative for congestion, ear pain and postnasal drip.   Eyes: Negative for redness and visual disturbance.  Respiratory: Negative for cough, shortness of breath and wheezing.   Gastrointestinal: Negative for abdominal pain and abdominal distention.  Genitourinary: Negative for dysuria, frequency and menstrual problem.  Musculoskeletal: Positive for back pain. Negative for arthralgias, joint swelling, myalgias and neck pain.  Skin: Negative for rash and wound.  Neurological: Positive for light-headedness and headaches. Negative for dizziness and weakness.  Hematological: Negative for adenopathy. Does not bruise/bleed easily.  Psychiatric/Behavioral: Negative for sleep disturbance and decreased concentration.   Past Medical History  Diagnosis Date  . Depression   . ADHD (attention deficit hyperactivity disorder)   . Obesity   . Hypertension   . Cancer     BASAL CELL FACE AND SHOULDERS , SQUAMOS CELL ON HAND AND MELANOMA LEFT ARM.Marland Kitchen.  . Cervix cancer 1987    microinvasive  . Leiomyoma  2013    multiple small    History   Social History  . Marital Status: Married    Spouse Name: N/A    Number of Children: N/A  . Years of Education: N/A   Occupational History  . Not on file.   Social History Main Topics  . Smoking status: Never Smoker   . Smokeless tobacco: Never Used  . Alcohol Use: Yes     Comment: WINE   . Drug Use: No  . Sexual Activity: Yes    Partners: Male    Birth Control/ Protection: Surgical     Comment: PARTNER WITH VASECTOMY   Other Topics Concern  . Not on file   Social History Narrative  . No narrative on file    Past Surgical History  Procedure Laterality Date  . Tonsillectomy      as a child  . Bunionectomy      LEFT FOOT WITH PINS  . Cesarean section    . Dilation and curettage of uterus      x2  . Cervical cone biopsy  1987  . Arm surgery      to remove melanoma on left upper outter arm    Family History  Problem Relation Age of Onset  . Stroke Mother   . Osteoporosis Mother   . Hyperlipidemia Father   . Breast cancer Maternal Grandmother 54    No Known Allergies  Current Outpatient Prescriptions on File Prior to Visit  Medication Sig Dispense Refill  . ACETAMINOPHEN-BUTALBITAL 50-650 MG TABS Take 50-650 mg by mouth every 6 (six) hours as needed.  30 each  1  . calcium-vitamin D (OSCAL WITH D 500-200) 500-200 MG-UNIT per tablet Take 1 tablet by mouth daily.        Marland Kitchen  fluticasone (FLONASE) 50 MCG/ACT nasal spray USE TWO SPRAYS IN EACH NOSTRIL DAILY  16 g  11  . hydrochlorothiazide (MICROZIDE) 12.5 MG capsule TAKE ONE CAPSULE BY MOUTH ONE TIME DAILY  90 capsule  9  . Polyethylene Glycol 3350 (MIRALAX PO) Take by mouth.      . verapamil (CALAN-SR) 240 MG CR tablet TAKE ONE TABLET BY MOUTH ONE TIME DAILY  90 tablet  3   No current facility-administered medications on file prior to visit.    BP 140/80  Pulse 76  Temp(Src) 98.2 F (36.8 C)  Resp 16  Ht 5\' 7"  (1.702 m)  Wt 224 lb (101.606 kg)  BMI 35.08  kg/m2       Objective:   Physical Exam  Nursing note and vitals reviewed. Constitutional: She is oriented to person, place, and time. She appears well-developed and well-nourished.  HENT:  Head: Normocephalic and atraumatic.  Swollen turbinates  Cardiovascular: Normal rate and regular rhythm.   Pulmonary/Chest: Effort normal and breath sounds normal.  Abdominal: Bowel sounds are normal.  Musculoskeletal: Normal range of motion.  Neurological: She is alert and oriented to person, place, and time.  Skin: Skin is dry.  Psychiatric: She has a normal mood and affect. Her behavior is normal.          Assessment & Plan:  Chronic sinusitis ceftin ceftin 250 x 21 days ADD refil per protocal Head aches complicated by sinusutis

## 2013-10-14 ENCOUNTER — Other Ambulatory Visit: Payer: Self-pay | Admitting: Internal Medicine

## 2013-10-22 ENCOUNTER — Other Ambulatory Visit: Payer: Self-pay

## 2013-10-22 DIAGNOSIS — Z1231 Encounter for screening mammogram for malignant neoplasm of breast: Secondary | ICD-10-CM

## 2013-11-15 ENCOUNTER — Encounter: Payer: Self-pay | Admitting: Internal Medicine

## 2013-11-15 ENCOUNTER — Other Ambulatory Visit: Payer: Self-pay | Admitting: *Deleted

## 2013-11-15 ENCOUNTER — Ambulatory Visit (INDEPENDENT_AMBULATORY_CARE_PROVIDER_SITE_OTHER): Payer: BC Managed Care – PPO | Admitting: Internal Medicine

## 2013-11-15 VITALS — BP 140/80 | HR 76 | Temp 98.2°F | Resp 16

## 2013-11-15 DIAGNOSIS — L03019 Cellulitis of unspecified finger: Secondary | ICD-10-CM

## 2013-11-15 DIAGNOSIS — L02519 Cutaneous abscess of unspecified hand: Secondary | ICD-10-CM

## 2013-11-15 DIAGNOSIS — L03011 Cellulitis of right finger: Secondary | ICD-10-CM

## 2013-11-15 MED ORDER — CLINDAMYCIN HCL 150 MG PO CAPS: 150.0000 mg | ORAL_CAPSULE | Freq: Three times a day (TID) | ORAL | Status: DC

## 2013-11-15 MED ORDER — MUPIROCIN 2 % EX OINT: 1.0000 "application " | TOPICAL_OINTMENT | Freq: Two times a day (BID) | CUTANEOUS | Status: DC

## 2013-11-15 NOTE — Progress Notes (Signed)
Pre visit review using our clinic review tool, if applicable. No additional management support is needed unless otherwise documented below in the visit note. 

## 2013-11-15 NOTE — Progress Notes (Signed)
   Subjective:    Patient ID: Misty Benson, female    DOB: 05-Jul-1961, 53 y.o.   MRN: [id number]  HPIassessment laceration to index finger of right hand and was seen in nursing care.  Apparently they treated it with silver nitrate for bleeding control and the site now appears to be infected.      Review of Systems  Constitutional: Negative for activity change, appetite change and fatigue.  HENT: Negative for congestion, ear pain, postnasal drip and sinus pressure.   Eyes: Negative for redness and visual disturbance.  Respiratory: Negative for cough, shortness of breath and wheezing.   Gastrointestinal: Negative for abdominal pain and abdominal distention.  Genitourinary: Negative for dysuria, frequency and menstrual problem.  Musculoskeletal: Negative for arthralgias, joint swelling, myalgias and neck pain.  Skin: Positive for wound. Negative for rash.  Neurological: Negative for dizziness, weakness and headaches.  Hematological: Negative for adenopathy. Does not bruise/bleed easily.  Psychiatric/Behavioral: Negative for sleep disturbance and decreased concentration.       Objective:   Physical Exam  Constitutional: She appears well-developed and well-nourished. No distress.  HENT:  Head: Normocephalic and atraumatic.  Right Ear: External ear normal.  Mouth/Throat: Oropharynx is clear and moist.  Eyes: Conjunctivae and EOM are normal. Pupils are equal, round, and reactive to light.  Neck: Normal range of motion. Neck supple. No JVD present. No tracheal deviation present. No thyromegaly present.  Cardiovascular: Normal rate and regular rhythm.   No murmur heard. Pulmonary/Chest: Effort normal and breath sounds normal. She has no wheezes. She exhibits no tenderness.  Abdominal: Soft. Bowel sounds are normal.  Musculoskeletal: She exhibits no edema and no tenderness.  Lymphadenopathy:    She has no cervical adenopathy.  Neurological: She has normal reflexes. No cranial nerve  deficit.  Skin: Rash noted. She is not diaphoretic.  Swelling and erythema around laceration  Psychiatric: She has a normal mood and affect. Her behavior is normal.          Assessment & Plan:  Cellulitis of the finger tip with Cleocin 150 mg 3 times a day for 10 days and Bactroban ointment applied twice a day with sterile dressing changes

## 2013-11-17 ENCOUNTER — Encounter: Payer: Self-pay | Admitting: Family Medicine

## 2013-11-17 ENCOUNTER — Ambulatory Visit (INDEPENDENT_AMBULATORY_CARE_PROVIDER_SITE_OTHER): Payer: BC Managed Care – PPO | Admitting: Family Medicine

## 2013-11-17 VITALS — BP 130/85 | HR 79 | Temp 99.0°F | Resp 18 | Ht 67.0 in | Wt 233.0 lb

## 2013-11-17 DIAGNOSIS — Z23 Encounter for immunization: Secondary | ICD-10-CM

## 2013-11-17 DIAGNOSIS — L089 Local infection of the skin and subcutaneous tissue, unspecified: Secondary | ICD-10-CM

## 2013-11-17 DIAGNOSIS — S61219S Laceration without foreign body of unspecified finger without damage to nail, sequela: Principal | ICD-10-CM

## 2013-11-17 DIAGNOSIS — IMO0002 Reserved for concepts with insufficient information to code with codable children: Secondary | ICD-10-CM

## 2013-11-17 MED ORDER — PROMETHAZINE HCL 12.5 MG PO TABS: 12.5000 mg | ORAL_TABLET | Freq: Three times a day (TID) | ORAL | Status: DC | PRN

## 2013-11-17 MED ORDER — TRAMADOL HCL 50 MG PO TABS: ORAL_TABLET | ORAL | Status: DC

## 2013-11-17 NOTE — Progress Notes (Signed)
Pre visit review using our clinic review tool, if applicable. No additional management support is needed unless otherwise documented below in the visit note. 

## 2013-11-17 NOTE — Patient Instructions (Signed)
Take 600 mg ibuprofen with food morning, mid afternoon, and bedtime.

## 2013-11-17 NOTE — Progress Notes (Signed)
OFFICE NOTE  11/17/2013  CC:  Chief Complaint  Patient presents with  . Laceration    finger pain     HPI: Patient is a 53 y.o. Caucasian female who is here for finger pain s/p finger laceration. Cut finger on veggie slicer 7 days ago, urgent care on [address].  Silver nitrate was applied and it was wrapped. No sutures were required.  Due to increased pain and swelling she saw her PMD 2 d/a and was rx'd clindamycin and bactroban for infection of the wound.  Still hurting quite a bit and wanted to get it checked out.  No fever or malaise.  Pertinent PMH:  Past Medical History  Diagnosis Date  . Depression   . ADHD (attention deficit hyperactivity disorder)   . Obesity   . Hypertension   . Cancer     BASAL CELL FACE AND SHOULDERS , SQUAMOS CELL ON HAND AND MELANOMA LEFT ARM.Marland Kitchen.  . Cervix cancer 1987    microinvasive  . Leiomyoma 2013    multiple small    MEDS:  Outpatient Prescriptions Prior to Visit  Medication Sig Dispense Refill  . amphetamine-dextroamphetamine (ADDERALL) 20 MG tablet Take 1 tablet (20 mg total) by mouth 2 (two) times daily.  180 tablet  0  . calcium-vitamin D (OSCAL WITH D 500-200) 500-200 MG-UNIT per tablet Take 1 tablet by mouth daily.        . celecoxib (CELEBREX) 200 MG capsule Take 200 mg by mouth daily as needed.      . clindamycin (CLEOCIN) 150 MG capsule Take 1 capsule (150 mg total) by mouth 3 (three) times daily.  30 capsule  0  . fluticasone (FLONASE) 50 MCG/ACT nasal spray USE TWO SPRAYS IN EACH NOSTRIL DAILY  16 g  11  . hydrochlorothiazide (MICROZIDE) 12.5 MG capsule Take one capsule by mouth one time daily  90 capsule  3  . mupirocin ointment (BACTROBAN) 2 % Place 1 application into the nose 2 (two) times daily.  22 g  0  . Polyethylene Glycol 3350 (MIRALAX PO) Take by mouth.      . verapamil (CALAN-SR) 240 MG CR tablet TAKE ONE TABLET BY MOUTH ONE TIME DAILY  90 tablet  3  . ACETAMINOPHEN-BUTALBITAL 50-650 MG TABS Take 50-650 mg by mouth  every 6 (six) hours as needed.  30 each  1  . cefUROXime (CEFTIN) 250 MG tablet Take 1 tablet (250 mg total) by mouth 2 (two) times daily with a meal.  42 tablet  0   No facility-administered medications prior to visit.    PE: Blood pressure 130/85, pulse 79, temperature 99 F (37.2 C), temperature source Temporal, resp. rate 18, height 5\' 7"  (1.702 m), weight 233 lb (105.688 kg), SpO2 97.00%. Gen: Alert, well appearing.  Patient is oriented to person, place, time, and situation. Left hand middle finger with tip cut off (about a 1/2 cm portion).  No signif exudate.  Granulation tissue present.  No signif erythema. Mild swelling to DIP but not tense-feeling.  It is tender to palpation.  No foul odor.  IMPRESSION AND PLAN:  Infected finger laceration, looks to be healing ok.  I think she just needs better pain control. I recommended she take 600mg  ibup tid, try to elevate finger, and continue her current abx and dressing changes. I also rx'd tramadol 50mg  to take prn more severe pain. Tdap  Booster given today. Signs/symptoms to call or return for were reviewed and pt expressed understanding.  An After Visit  Summary was printed and given to the patient.  FOLLOW UP: prn

## 2013-11-24 ENCOUNTER — Other Ambulatory Visit (HOSPITAL_COMMUNITY)
Admission: RE | Admit: 2013-11-24 | Discharge: 2013-11-24 | Disposition: A | Discharge status: Home / Self Care | Payer: BC Managed Care – PPO | Source: Ambulatory Visit | Attending: Gynecology | Admitting: Gynecology

## 2013-11-24 ENCOUNTER — Encounter: Payer: Self-pay | Admitting: Gynecology

## 2013-11-24 ENCOUNTER — Ambulatory Visit (INDEPENDENT_AMBULATORY_CARE_PROVIDER_SITE_OTHER): Payer: BC Managed Care – PPO | Admitting: Gynecology

## 2013-11-24 VITALS — BP 122/80 | Ht 68.0 in | Wt 229.0 lb

## 2013-11-24 DIAGNOSIS — N951 Menopausal and female climacteric states: Secondary | ICD-10-CM

## 2013-11-24 DIAGNOSIS — Z01419 Encounter for gynecological examination (general) (routine) without abnormal findings: Secondary | ICD-10-CM | POA: Insufficient documentation

## 2013-11-24 DIAGNOSIS — Z1151 Encounter for screening for human papillomavirus (HPV): Secondary | ICD-10-CM | POA: Insufficient documentation

## 2013-11-24 MED ORDER — PROGESTERONE MICRONIZED 100 MG PO CAPS: 100.0000 mg | ORAL_CAPSULE | Freq: Every day | ORAL | Status: DC

## 2013-11-24 MED ORDER — ESTRADIOL 0.05 MG/24HR TD PTTW: 1.0000 | MEDICATED_PATCH | TRANSDERMAL | Status: DC

## 2013-11-24 NOTE — Patient Instructions (Signed)
Start on estrogen patches twice weekly. Take progesterone pill every night. Call me if you do any bleeding. Call me if you have any questions. Followup for annual exam in one year.

## 2013-11-24 NOTE — Addendum Note (Signed)
Addended by: Nelva Nay on: 11/24/2013 12:00 PM   Modules accepted: Orders

## 2013-11-24 NOTE — Progress Notes (Signed)
Misty Benson 05/05/1961 [id number]        53 y.o.  [id number] for annual exam.  Several issues noted below.  Past medical history,surgical history, problem list, medications, allergies, family history and social history were all reviewed and documented in the EPIC chart.  ROS:  Performed and pertinent positives and negatives are included in the history, assessment and plan .  Exam: Kim assistant Filed Vitals:   11/24/13 1105  BP: 122/80  Height: 5\' 8"  (1.727 m)  Weight: 229 lb (103.874 kg)   General appearance  Normal Skin grossly normal Head/Neck normal with no cervical or supraclavicular adenopathy thyroid normal Lungs  clear Cardiac RR, without RMG Abdominal  soft, nontender, without masses, organomegaly or hernia Breasts  examined lying and sitting without masses, retractions, discharge or axillary adenopathy. Pelvic  Ext/BUS/vagina  Normal  Cervix  Normal. Pap/HPV  Uterus  anteverted, normal size, shape and contour, midline and mobile nontender   Adnexa  Without masses or tenderness    Anus and perineum  Normal   Rectovaginal  Normal sphincter tone without palpated masses or tenderness.    Assessment/Plan:  53 y.o. [name] female for annual exam, vasectomy birth control.   1. Menopausal symptoms. LMP May 2014. No bleeding since then. Patient having worsening night sweats keeping her up at night as well as some hot flashes. No significant vaginal dryness or dyspareunia. No bleeding at all since her LMP.  I reviewed the whole issue of HRT with her to include the WHI study with increased risk of stroke, heart attack, DVT and breast cancer. The ACOG and NAMS statements for lowest dose for the shortest period of time reviewed. Transdermal versus oral first-pass effect benefit discussed. Patient ultimately wants to try HRT. Minivelle 0.05 mg patches x2 weeks sample with annual prescription written. Prometrium 100 mg at bedtime. Followup with results. Report any vaginal  bleeding. 2. Pap smear 2012. Pap/HPV done today. History of microinvasion 1987 status post cone biopsy with negative, regular Pap smears since then. 3. Mammography scheduled this week. SBE monthly reviewed. Continue with annual mammography. 4. Colonoscopy 2011. Repeat at their recommended interval. 5. DEXA never. We'll plan further into menopause. Increase calcium and vitamin D recommendations reviewed. 6. Health maintenance. No blood work done as she reports this all done through her primary physician's office. Followup with results from her HRT otherwise annually.    Note: Th onis document was prepared with digital dictation and possible smart phrase technology. Any transcriptional errors that result from this process are unintentional.   Anastasio Auerbach MD, 11:54 AM 11/24/2013

## 2013-11-25 LAB — URINALYSIS W MICROSCOPIC + REFLEX CULTURE
Bacteria, UA: NONE SEEN
Crystals: NONE SEEN
Glucose, UA: NEGATIVE mg/dL
Hgb urine dipstick: NEGATIVE
Ketones, ur: NEGATIVE mg/dL
LEUKOCYTES UA: NEGATIVE
Nitrite: NEGATIVE
PH: 5.5 (ref 5.0–8.0)
Protein, ur: NEGATIVE mg/dL
SPECIFIC GRAVITY, URINE: 1.028 (ref 1.005–1.030)
Urobilinogen, UA: 0.2 mg/dL (ref 0.0–1.0)

## 2013-11-26 ENCOUNTER — Ambulatory Visit
Admission: RE | Admit: 2013-11-26 | Discharge: 2013-11-26 | Disposition: A | Discharge status: Home / Self Care | Payer: BC Managed Care – PPO | Source: Ambulatory Visit

## 2013-11-26 DIAGNOSIS — Z1231 Encounter for screening mammogram for malignant neoplasm of breast: Secondary | ICD-10-CM

## 2013-12-10 ENCOUNTER — Telehealth: Payer: Self-pay | Admitting: Internal Medicine

## 2013-12-10 NOTE — Telephone Encounter (Signed)
Patient Information:  Caller Name: [place]  Phone: [phone]  Patient: Misty Benson, Misty Benson  Gender: Female  DOB: [birth year]  Age: 53 Years  PCP: Benay Pillow (Adults only)  Pregnant: No  Office Follow Up:  Does the office need to follow up with this patient?: Yes  Instructions For The Office: Patient wants to mointor symptoms since they are improving.  RN Note:  Caller does not want to go to ER .She will closely mointor since it is clearing up at present  Care advice given.  Symptoms  Reason For Call & Symptoms: Patient states this afternoon she began having blurry vision while working on computer. She tried eye drops and noted while driving her vision was slightly blurry. It appears to have cleared up now. Patient was wearing OTC readers for computer work..  She had lasik.  Headache noted to be on top or back of head this week, intermittent. Hx HTN and is on verapmil/HCTZ. compliant. Blood pressure is not elevated. Last eye exam 07/2013. No dizziness or lightheaded.. No pain or discomfort.  Reviewed Health History In EMR: Yes  Reviewed Medications In EMR: Yes  Reviewed Allergies In EMR: Yes  Reviewed Surgeries / Procedures: Yes  Date of Onset of Symptoms: 12/10/2013 OB / GYN:  LMP: Unknown  Guideline(s) Used:  Headache  Eye Pain  Disposition Per Guideline:   Go to ED Now (or to Office with PCP Approval)  Reason For Disposition Reached:   Blurred vision and new or worsening  Advice Given:  Call Back If:  Headache lasts longer than 24 hours  You become worse.  Reassurance:   You have told me that the pain is mild and that your vision has not gotten worse. This is reassuring.  The two most common symptoms of a serious eye problem are: worsening pain and worsening vision.  Here is some general care advice for eye pain that may help.  Cool [employer] Cloth:   Apply a cool wet cloth to the closed eyes for 10 minutes as needed.  Artificial Tears for Dry Eyes:  Artificial tears (e.g.,  Visine Tears) are available over-the-counter (OTC).  Apply 1-2 drops to affected eye(s) as needed.  Call Back If:  Pain increases  Pain lasts over 24 hours  Pus or yellow/green discharge occurs  Blurred vision occurs  You become worse.  Patient Refused Recommendation:  Patient Will Follow Up With Office Later  Patient wants to mointor symptoms

## 2013-12-13 NOTE — Telephone Encounter (Signed)
Dr Arnoldo Morale informed and he request her to monitor

## 2013-12-27 ENCOUNTER — Telehealth: Payer: Self-pay | Admitting: Internal Medicine

## 2013-12-27 NOTE — Telephone Encounter (Signed)
Patient Information:  Caller Name: Anara  Phone: [phone]  Patient: Misty, Benson  Gender: Female  DOB: 08/24/1997  Age: 53 Years  PCP: [facility])  Pregnant: No  Office Follow Up:  Does the office need to follow up with this patient?: No  Instructions For The Office: N/A  RN Note:  She has appointment scheduled for 12/28/13 @ 10:30  Symptoms  Reason For Call & Symptoms: R ear ringing for past few days and ear feels blocked. Afebrile. No pain.  Reviewed Health History In EMR: Yes  Reviewed Medications In EMR: Yes  Reviewed Allergies In EMR: Yes  Reviewed Surgeries / Procedures: Yes  Date of Onset of Symptoms: 12/23/2013  Treatments Tried: Alcohol in ear and helped temporarily, cleaned with Q tips  Treatments Tried Worked: No  Weight: N/A OB / GYN:  LMP: Unknown  Guideline(s) Used:  Ear - Congestion  Disposition Per Guideline:   See Within 3 Days in Office  Reason For Disposition Reached:   Blocked ear wax suspected  Advice Given:  Reassurance:  It's probably from a blocked eustachian tube, rather than an ear infection.  More Chewing and Swallowing:   Swallow water or other fluid while the nose is pinched closed. (Reason: creates a vacuum in the nose that helps the Eustachian tube to open up.) After age 67, can also use chewing gum.   Appointment  scheduled on 12/28/13 @ 1030

## 2013-12-31 ENCOUNTER — Ambulatory Visit (INDEPENDENT_AMBULATORY_CARE_PROVIDER_SITE_OTHER): Payer: BC Managed Care – PPO | Admitting: Internal Medicine

## 2013-12-31 DIAGNOSIS — Z23 Encounter for immunization: Secondary | ICD-10-CM

## 2014-01-20 ENCOUNTER — Other Ambulatory Visit: Payer: Self-pay | Admitting: Dermatology

## 2014-02-08 ENCOUNTER — Telehealth: Payer: Self-pay | Admitting: *Deleted

## 2014-02-08 MED ORDER — ZOLPIDEM TARTRATE 10 MG PO TABS: 10.0000 mg | ORAL_TABLET | Freq: Every evening | ORAL | Status: DC | PRN

## 2014-02-08 NOTE — Telephone Encounter (Signed)
Pt was seen on 11/24/13 for annual c/o worsening night sweats keeping her up at night as well as some hot flashes. Prescribed Minivelle 0.05 mg patches & Prometrium 100 mg at bedtime. She never started HRT, said you spoke with her about a sleep aid? She asked if trazodone would be any option?

## 2014-02-08 NOTE — Telephone Encounter (Signed)
I am not a big trazodone fan for a sleep aid.  We can try Ambien 10 mg #30 refill x3. HRT as we discussed also would address this issue.

## 2014-02-08 NOTE — Telephone Encounter (Signed)
Rx called in Pt informed

## 2014-03-14 ENCOUNTER — Telehealth: Payer: Self-pay | Admitting: Internal Medicine

## 2014-03-14 NOTE — Telephone Encounter (Signed)
Error/gd °

## 2014-03-16 ENCOUNTER — Encounter: Payer: Self-pay | Admitting: Physician Assistant

## 2014-03-16 ENCOUNTER — Ambulatory Visit (INDEPENDENT_AMBULATORY_CARE_PROVIDER_SITE_OTHER): Payer: BC Managed Care – PPO | Admitting: Physician Assistant

## 2014-03-16 VITALS — BP 144/90 | HR 91 | Temp 98.2°F | Resp 16 | Wt 231.0 lb

## 2014-03-16 DIAGNOSIS — I10 Essential (primary) hypertension: Secondary | ICD-10-CM

## 2014-03-16 DIAGNOSIS — H6120 Impacted cerumen, unspecified ear: Secondary | ICD-10-CM

## 2014-03-16 DIAGNOSIS — J302 Other seasonal allergic rhinitis: Secondary | ICD-10-CM

## 2014-03-16 DIAGNOSIS — H6690 Otitis media, unspecified, unspecified ear: Secondary | ICD-10-CM

## 2014-03-16 DIAGNOSIS — J309 Allergic rhinitis, unspecified: Secondary | ICD-10-CM

## 2014-03-16 MED ORDER — AMOXICILLIN 500 MG PO CAPS: 500.0000 mg | ORAL_CAPSULE | Freq: Three times a day (TID) | ORAL | Status: DC

## 2014-03-16 NOTE — Progress Notes (Signed)
Subjective:    Patient ID: Misty Benson, female    DOB: August 04, 1961, 53 y.o.   MRN: [id number]  HPI Patient is a 53 year old Caucasian female presenting to the clinic today for sinus congestion, dizziness, and nausea. Patient states that 3 weeks ago she started having dizzy spells which included occasional room spinning. Patient states that she has also been having allergic problems with oak pollen and knows that this can cause her to have ear fullness. She states that she has been having ear pressure and pain that has been increasing over the past 3 days and that today she feels very dizzy and nauseous, though she has not had to vomit. She states that she has been having some postnasal drip which has given her a sore throat and she has been producing yellow mucus. She has not had a cough and does not have a headache. She has been taking Allegra daily and Flonase which have offered mild relief. She has also tried Sudafed, and states that after taking it she couldn't sleep, however she is not sure that the Sudafed was the cause. She denies fevers, chills, vomiting, diarrhea, shortness of breath, chest pain, syncope, and presyncope.   Review of Systems As per the history of present illness and are otherwise negative.  Past Medical History  Diagnosis Date  . ADHD (attention deficit hyperactivity disorder)   . Obesity   . Hypertension   . Cancer     BASAL CELL FACE AND SHOULDERS , SQUAMOS CELL ON HAND AND MELANOMA LEFT ARM.Marland Kitchen.  . Cervix cancer 1987    microinvasive  . Leiomyoma 2013    multiple small  . Anxiety    Past Surgical History  Procedure Laterality Date  . Tonsillectomy      as a child  . Bunionectomy      LEFT FOOT WITH PINS  . Cesarean section    . Dilation and curettage of uterus      x2  . Cervical cone biopsy  1987  . Arm surgery      to remove melanoma on left upper outter arm  . Foot surgery      reports that she has never smoked. She has never used smokeless  tobacco. She reports that she drinks alcohol. She reports that she does not use illicit drugs. family history includes Atrial fibrillation in her father; Breast cancer (age of onset: 64) in her maternal grandmother; Hyperlipidemia in her father; Osteoporosis in her mother; Stroke in her mother. No Known Allergies  No change in PFS history since last office visit on 11/15/2013.     Objective:   Physical Exam  Nursing note and vitals reviewed. Constitutional: She is oriented to person, place, and time. She appears well-developed and well-nourished. No distress.  HENT:  Head: Normocephalic and atraumatic.  Nose: Nose normal.  Mouth/Throat: No oropharyngeal exudate.  Oropharynx is slightly erythematous with cobblestoning, no exudate.  Bilateral ear canals are clogged with dry wax.  After attempted ear lavage, only left ear canal was clean enough to visualize TM.  Left tympanic membrane appears bulging and erythematous. No effusion visible.  Bilateral frontal and maxillary sinuses are nontender to palpation.  Eyes: Conjunctivae and EOM are normal. Pupils are equal, round, and reactive to light.  Neck: Normal range of motion.  Cardiovascular: Normal rate, regular rhythm, normal heart sounds and intact distal pulses.  Exam reveals no gallop and no friction rub.   No murmur heard. Pulmonary/Chest: Effort normal and breath sounds normal.  No respiratory distress. She has no wheezes. She has no rales. She exhibits no tenderness.  Musculoskeletal: Normal range of motion.  Neurological: She is alert and oriented to person, place, and time. She has normal reflexes. She displays normal reflexes. No cranial nerve deficit. She exhibits normal muscle tone. Coordination normal.  Weber and Rhine test were normal bilaterally.  Negative Dix-Hallpike.  Negative Romberg.  Normal gait.  Skin: Skin is warm and dry. No rash noted. She is not diaphoretic. No erythema. No pallor.  Psychiatric: She has a  normal mood and affect. Her behavior is normal. Judgment and thought content normal.  Pt seems anxious.   Filed Vitals:   03/16/14 1312 03/16/14 1425  BP: 164/96 144/90  Pulse: 91   Temp: 98.2 F (36.8 C)   TempSrc: Oral   Resp: 16   Weight: 231 lb (104.781 kg)   SpO2: 98%    Lab Results  Component Value Date   WBC 5.8 03/15/2013   HGB 13.0 03/15/2013   HCT 38.2 03/15/2013   PLT 263.0 03/15/2013   GLUCOSE 78 03/15/2013   CHOL 169 03/15/2013   TRIG 49.0 03/15/2013   HDL 73.50 03/15/2013   LDLCALC 86 03/15/2013   ALT 21 03/15/2013   AST 20 03/15/2013   NA 141 03/15/2013   K 4.2 03/15/2013   CL 109 03/15/2013   CREATININE 0.7 03/15/2013   BUN 21 03/15/2013   CO2 23 03/15/2013   TSH 1.29 03/15/2013      Assessment & Plan:  Misty Benson was seen today for nasal congestion.  Diagnoses and associated orders for this visit:  Otitis media - amoxicillin (AMOXIL) 500 MG capsule; Take 1 capsule (500 mg total) by mouth 3 (three) times daily. - Pt requests ENT referral due to dizziness if it persists despite treatment. Gave pt information for Dr. Lucia Gaskins ENT, and explained she doesn't need referral.   Seasonal allergies - Continue daily Allegra and nasal steroids.  Cerumen impaction - Ear Lavage: Pt refused bilateral ear lavage, but was able to tolerate left ear lavage, which was successful at clearing left ear canal of wax. - Pt will attempt at home ear wax cleaning product she already has.  Hypertension - Blood Pressure elevated at today's visit initially 164/96, but decreased to 144/90 after sitting in room for a while. Normally is in the 130's/80's at previous visits and at home per pt. BP elevation could be due to anxiety or illness, and also to recent usage of Sudafed. Pt asymptomatic otherwise. Advise pt to not take Sudafed due to elevating effects on hypertension. Will monitor at next office visit.  Patient Instructions  Amoxicillin 3 times per day for 7 days for Ear infection.  Call ENT office of Radene Journey MD phone number: [phone] and make appointment to be seen for dizziness.  Plain Over the Counter Mucinex (NOT Mucinex D) for thick secretions  Force NON dairy fluids, drinking plenty of water is best.    Over the Counter Flonase OR Nasacort AQ 1 spray in each nostril twice a day as needed. Use the "crossover" technique into opposite nostril spraying toward opposite ear @ 45 degree angle, not straight up into nostril.   Plain Over the Counter Allegra (NOT D )  160 daily , OR Loratidine 10 mg , OR Zyrtec 10 mg @ bedtime  as needed for itchy eyes & sneezing.  Saline Irrigation and Saline Sprays can also help reduce symptoms.  Stay away from [place].  Follow up to  clinic if symptoms worsen or do not improve despite treatment.

## 2014-03-16 NOTE — Progress Notes (Signed)
Pre visit review using our clinic review tool, if applicable. No additional management support is needed unless otherwise documented below in the visit note. 

## 2014-03-16 NOTE — Patient Instructions (Addendum)
Amoxicillin 3 times per day for 7 days for Ear infection.  Call ENT office of Radene Journey MD phone number: [phone] and make appointment to be seen for dizziness.  Plain Over the Counter Mucinex (NOT Mucinex D) for thick secretions  Force NON dairy fluids, drinking plenty of water is best.    Over the Counter Flonase OR Nasacort AQ 1 spray in each nostril twice a day as needed. Use the "crossover" technique into opposite nostril spraying toward opposite ear @ 45 degree angle, not straight up into nostril.   Plain Over the Counter Allegra (NOT D )  160 daily , OR Loratidine 10 mg , OR Zyrtec 10 mg @ bedtime  as needed for itchy eyes & sneezing.  Saline Irrigation and Saline Sprays can also help reduce symptoms.  Stay away from [place].  Follow up to clinic if symptoms worsen or do not improve despite treatment.   Otitis Media, Adult Otitis media is redness, soreness, and puffiness (swelling) in the space just behind your eardrum (middle ear). It may be caused by allergies or infection. It often happens along with a cold. HOME CARE  Take your medicine as told. Finish it even if you start to feel better.  Only take over-the-counter or prescription medicines for pain, discomfort, or fever as told by your doctor.  Follow up with your doctor as told. GET HELP IF:  You have otitis media only in one ear or bleeding from your nose or both.  You notice a lump on your neck.  You are not getting better in 3 5 days.  You feel worse instead of better. GET HELP RIGHT AWAY IF:   You have pain that is not helped with medicine.  You have puffiness, redness, or pain around your ear.  You get a stiff neck.  You cannot move part of your face (paralysis).  You notice that the bone behind your ear hurts when you touch it. MAKE SURE YOU:   Understand these instructions.  Will watch your condition.  Will get help right away if you are not doing well or get worse. Document  Released: 04/15/2008 Document Revised: 06/30/2013 Document Reviewed: 05/25/2013 [facility] Patient Information 2014 [place], [place].

## 2014-04-06 ENCOUNTER — Other Ambulatory Visit (INDEPENDENT_AMBULATORY_CARE_PROVIDER_SITE_OTHER): Payer: BC Managed Care – PPO

## 2014-04-06 DIAGNOSIS — Z0000 Encounter for general adult medical examination without abnormal findings: Secondary | ICD-10-CM

## 2014-04-06 LAB — CBC WITH DIFFERENTIAL/PLATELET
Basophils Absolute: 0 10*3/uL (ref 0.0–0.1)
Basophils Relative: 0.6 % (ref 0.0–3.0)
EOS PCT: 2.2 % (ref 0.0–5.0)
Eosinophils Absolute: 0.1 10*3/uL (ref 0.0–0.7)
HEMATOCRIT: 38.8 % (ref 36.0–46.0)
Hemoglobin: 12.9 g/dL (ref 12.0–15.0)
LYMPHS ABS: 1.3 10*3/uL (ref 0.7–4.0)
Lymphocytes Relative: 22.8 % (ref 12.0–46.0)
MCHC: 33.1 g/dL (ref 30.0–36.0)
MCV: 91.6 fl (ref 78.0–100.0)
Monocytes Absolute: 0.5 10*3/uL (ref 0.1–1.0)
Monocytes Relative: 8.2 % (ref 3.0–12.0)
NEUTROS ABS: 3.7 10*3/uL (ref 1.4–7.7)
Neutrophils Relative %: 66.2 % (ref 43.0–77.0)
Platelets: 246 10*3/uL (ref 150.0–400.0)
RBC: 4.24 Mil/uL (ref 3.87–5.11)
RDW: 13.1 % (ref 11.5–15.5)
WBC: 5.6 10*3/uL (ref 4.0–10.5)

## 2014-04-06 LAB — POCT URINALYSIS DIPSTICK
Bilirubin, UA: NEGATIVE
Blood, UA: NEGATIVE
Glucose, UA: NEGATIVE
Ketones, UA: NEGATIVE
NITRITE UA: NEGATIVE
PROTEIN UA: NEGATIVE
SPEC GRAV UA: 1.025
Urobilinogen, UA: 0.2
pH, UA: 5.5

## 2014-04-06 LAB — BASIC METABOLIC PANEL
BUN: 22 mg/dL (ref 6–23)
CHLORIDE: 106 meq/L (ref 96–112)
CO2: 27 mEq/L (ref 19–32)
Calcium: 9.3 mg/dL (ref 8.4–10.5)
Creatinine, Ser: 0.8 mg/dL (ref 0.4–1.2)
GFR: 84.39 mL/min (ref >60.00)
Glucose, Bld: 85 mg/dL (ref 70–99)
POTASSIUM: 3.9 meq/L (ref 3.5–5.1)
SODIUM: 140 meq/L (ref 135–145)

## 2014-04-06 LAB — LIPID PANEL
CHOL/HDL RATIO: 3
CHOLESTEROL: 195 mg/dL (ref 0–200)
HDL: 74.1 mg/dL (ref >39.00)
LDL CALC: 109 mg/dL — AB (ref 0–99)
Triglycerides: 62 mg/dL (ref 0.0–149.0)
VLDL: 12.4 mg/dL (ref 0.0–40.0)

## 2014-04-06 LAB — HEPATIC FUNCTION PANEL
ALT: 16 U/L (ref 0–35)
AST: 18 U/L (ref 0–37)
Albumin: 3.6 g/dL (ref 3.5–5.2)
Alkaline Phosphatase: 54 U/L (ref 39–117)
Bilirubin, Direct: 0 mg/dL (ref 0.0–0.3)
Total Bilirubin: 0.5 mg/dL (ref 0.2–1.2)
Total Protein: 6.6 g/dL (ref 6.0–8.3)

## 2014-04-06 LAB — TSH: TSH: 0.59 u[IU]/mL (ref 0.35–4.50)

## 2014-04-13 ENCOUNTER — Encounter: Payer: BC Managed Care – PPO | Admitting: Internal Medicine

## 2014-04-20 ENCOUNTER — Telehealth: Payer: Self-pay | Admitting: Internal Medicine

## 2014-04-20 ENCOUNTER — Encounter: Payer: Self-pay | Admitting: Internal Medicine

## 2014-04-20 ENCOUNTER — Ambulatory Visit (INDEPENDENT_AMBULATORY_CARE_PROVIDER_SITE_OTHER): Payer: BC Managed Care – PPO | Admitting: Internal Medicine

## 2014-04-20 VITALS — BP 140/90 | HR 78 | Temp 98.6°F | Ht 68.0 in | Wt 227.0 lb

## 2014-04-20 DIAGNOSIS — I10 Essential (primary) hypertension: Secondary | ICD-10-CM

## 2014-04-20 DIAGNOSIS — F988 Other specified behavioral and emotional disorders with onset usually occurring in childhood and adolescence: Secondary | ICD-10-CM

## 2014-04-20 DIAGNOSIS — Z0000 Encounter for general adult medical examination without abnormal findings: Secondary | ICD-10-CM

## 2014-04-20 MED ORDER — LISDEXAMFETAMINE DIMESYLATE 50 MG PO CAPS: 50.0000 mg | ORAL_CAPSULE | ORAL | Status: DC

## 2014-04-20 NOTE — Patient Instructions (Addendum)
The patient is instructed to continue all medications as prescribed. Schedule followup with check out clerk upon leaving the clinic  

## 2014-04-20 NOTE — Progress Notes (Signed)
Pre visit review using our clinic review tool, if applicable. No additional management support is needed unless otherwise documented below in the visit note. 

## 2014-04-20 NOTE — Telephone Encounter (Signed)
Pt and her husband  would like to est with dr Burnice Logan. Can I sch?

## 2014-04-20 NOTE — Progress Notes (Signed)
Subjective:    Patient ID: Misty Benson, female    DOB: [birth year], 53 y.o.   MRN: [id number]  HPI  CPX ADD Obesity HTN and good blood pressure control  Review of Systems  Constitutional: Negative for activity change and appetite change.  HENT: Negative for postnasal drip.   Eyes: Negative for visual disturbance.  Respiratory: Negative.   Cardiovascular: Negative.   Gastrointestinal: Negative for abdominal distention.  Genitourinary: Negative.   Neurological: Negative for tremors, weakness and headaches.  Psychiatric/Behavioral: Negative for behavioral problems, decreased concentration and agitation.      Past Medical History  Diagnosis Date  . ADHD (attention deficit hyperactivity disorder)   . Obesity   . Hypertension   . Cancer     BASAL CELL FACE AND SHOULDERS , SQUAMOS CELL ON HAND AND MELANOMA LEFT ARM.Marland Kitchen.  . Cervix cancer 1987    microinvasive  . Leiomyoma 2013    multiple small  . Anxiety     History   Social History  . Marital Status: Married    Spouse Name: N/A    Number of Children: N/A  . Years of Education: N/A   Occupational History  . Not on file.   Social History Main Topics  . Smoking status: Never Smoker   . Smokeless tobacco: Never Used  . Alcohol Use: Yes     Comment: WINE   . Drug Use: No  . Sexual Activity: Yes    Partners: Male    Birth Control/ Protection: Surgical     Comment: PARTNER WITH VASECTOMY   Other Topics Concern  . Not on file   Social History Narrative  . No narrative on file    Past Surgical History  Procedure Laterality Date  . Tonsillectomy      as a child  . Bunionectomy      LEFT FOOT WITH PINS  . Cesarean section    . Dilation and curettage of uterus      x2  . Cervical cone biopsy  1987  . Arm surgery      to remove melanoma on left upper outter arm  . Foot surgery      Family History  Problem Relation Age of Onset  . Stroke Mother   . Osteoporosis Mother   . Hyperlipidemia Father   .  Atrial fibrillation Father   . Breast cancer Maternal Grandmother 75    No Known Allergies  Current Outpatient Prescriptions on File Prior to Visit  Medication Sig Dispense Refill  . amphetamine-dextroamphetamine (ADDERALL) 20 MG tablet Take 1 tablet (20 mg total) by mouth 2 (two) times daily.  180 tablet  0  . calcium-vitamin D (OSCAL WITH D 500-200) 500-200 MG-UNIT per tablet Take 1 tablet by mouth daily.        . celecoxib (CELEBREX) 200 MG capsule Take 200 mg by mouth daily as needed.      . fluticasone (FLONASE) 50 MCG/ACT nasal spray USE TWO SPRAYS IN EACH NOSTRIL DAILY  16 g  11  . hydrochlorothiazide (MICROZIDE) 12.5 MG capsule Take one capsule by mouth one time daily  90 capsule  3  . Polyethylene Glycol 3350 (MIRALAX PO) Take by mouth.      . verapamil (CALAN-SR) 240 MG CR tablet TAKE ONE TABLET BY MOUTH ONE TIME DAILY  90 tablet  3  . zolpidem (AMBIEN) 10 MG tablet Take 1 tablet (10 mg total) by mouth at bedtime as needed for sleep.  30 tablet  3  No current facility-administered medications on file prior to visit.    BP 140/90  Pulse 78  Temp(Src) 98.6 F (37 C) (Oral)  Ht 5\' 8"  (1.727 m)  Wt 227 lb (102.967 kg)  BMI 34.52 kg/m2  SpO2 98%    Objective:   Physical Exam  Nursing note and vitals reviewed. Constitutional: She is oriented to person, place, and time. She appears well-developed and well-nourished. No distress.  obese  HENT:  Head: Normocephalic and atraumatic.  Eyes: Conjunctivae and EOM are normal. Pupils are equal, round, and reactive to light.  Neck: Normal range of motion. Neck supple. No JVD present. No tracheal deviation present. No thyromegaly present.  Cardiovascular: Normal rate, regular rhythm and normal heart sounds.   No murmur heard. Pulmonary/Chest: Effort normal and breath sounds normal. She has no wheezes. She exhibits no tenderness.  Abdominal: Soft. Bowel sounds are normal.  Musculoskeletal: Normal range of motion. She exhibits no  edema and no tenderness.  Lymphadenopathy:    She has no cervical adenopathy.  Neurological: She is alert and oriented to person, place, and time. She has normal reflexes. No cranial nerve deficit.  Skin: Skin is warm and dry. She is not diaphoretic.  Psychiatric: She has a normal mood and affect. Her behavior is normal.          Assessment & Plan:   This is a routine wellness  examination for this patient . I reviewed all health maintenance protocols including mammography, colonoscopy, bone density Needed referrals were placed. Age and diagnosis  appropriate screening labs were ordered. Her immunization history was reviewed and appropriate vaccinations were ordered. Her current medications and allergies were reviewed and needed refills of her chronic medications were ordered. The plan for yearly health maintenance was discussed all orders and referrals were made as appropriate.  Active problems Obesity  Trial for the vyvnase 50  Will call in 30 day either for 90- days or change back to adderal  ADD HTN OA with knee and back pain

## 2014-04-21 NOTE — Telephone Encounter (Signed)
ok 

## 2014-04-22 NOTE — Telephone Encounter (Signed)
Pt is aware.  

## 2014-04-27 ENCOUNTER — Encounter: Payer: Self-pay | Admitting: Internal Medicine

## 2014-05-02 ENCOUNTER — Other Ambulatory Visit: Payer: Self-pay | Admitting: Internal Medicine

## 2014-06-07 ENCOUNTER — Other Ambulatory Visit: Payer: Self-pay

## 2014-06-07 MED ORDER — ZOLPIDEM TARTRATE 10 MG PO TABS: 10.0000 mg | ORAL_TABLET | Freq: Every evening | ORAL | Status: DC | PRN

## 2014-06-07 NOTE — Telephone Encounter (Signed)
Called into pharmacy

## 2014-07-01 ENCOUNTER — Telehealth: Payer: Self-pay | Admitting: Internal Medicine

## 2014-07-01 DIAGNOSIS — F988 Other specified behavioral and emotional disorders with onset usually occurring in childhood and adolescence: Secondary | ICD-10-CM

## 2014-07-01 MED ORDER — LISDEXAMFETAMINE DIMESYLATE 50 MG PO CAPS: 50.0000 mg | ORAL_CAPSULE | ORAL | Status: DC

## 2014-07-01 NOTE — Telephone Encounter (Signed)
Pt needs new rx vyvanse 50 mg

## 2014-07-01 NOTE — Telephone Encounter (Signed)
Pt notified, rx signed and at front

## 2014-07-01 NOTE — Telephone Encounter (Signed)
rx printed awaiting sig

## 2014-07-05 ENCOUNTER — Ambulatory Visit (INDEPENDENT_AMBULATORY_CARE_PROVIDER_SITE_OTHER): Payer: BC Managed Care – PPO | Admitting: Sports Medicine

## 2014-07-05 ENCOUNTER — Encounter: Payer: Self-pay | Admitting: Sports Medicine

## 2014-07-05 VITALS — BP 127/86 | Ht 68.0 in | Wt 225.0 lb

## 2014-07-05 DIAGNOSIS — M545 Low back pain, unspecified: Secondary | ICD-10-CM

## 2014-07-05 NOTE — Progress Notes (Signed)
   Subjective:    Patient ID: Misty Benson, female    DOB: 02-12-1961, 53 y.o.   MRN: [id number]  HPI Misty Benson is a new patient who presents with lower back pain. She reports right lower back pain. She describes a tightness/gripping sensation over her right SI joint. It tends to bother her most after long periods of standing or walking. She feels better after sitting down and is able to do some stretches that seem to improve her pain.   Back pain history: She fell in March 2015 going down a few steps and landed on her right hip. Since then, she has seen a Restaurant manager, fast food and a few providers at [employer]. She has done some PT with no improvement. She had some right hip bursitis that resolved with a steroid injection. She also received an SI joint steroid injection that improved her back pain by "70%".  She is here today for a second opinion on what to do next to completely resolve her back pain.   Review of Systems     Objective:   Physical Exam Gen: well-dressed, well-nourished woman sitting comfortably  Right hip: 80-85 degree range of internal/external rotation. Positive FABER test.   Lower back: no visible swelling, deformity, or erythema. No tenderness to palpation over lumbar spine. No tenderness over SI joint. Negative straight leg bilaterally. Unable to reproduce pain with forward or backward bend while standing.   Imaging reviewed from outside institution:  Lumbar spine x-ray (03/07/14): Mild disc space narrowing at L5-S1  Hip x-ray (03/07/14): unremarkable  Lumbar spine x-ray with flexion and extension views (03/25/14): Unremarkable, no noted instability  Lumbar spine MRI (03/25/14): Mild degenerative changes     Assessment & Plan:  **lower back pain: - likely right SI joint related - obtain records from [place] referral to Barbaraann Barthel - f/u in 4-6 weeks  Written by: Ivan Anchors, MS4

## 2014-07-19 ENCOUNTER — Other Ambulatory Visit: Payer: Self-pay | Admitting: Internal Medicine

## 2014-07-21 ENCOUNTER — Other Ambulatory Visit: Payer: Self-pay | Admitting: Internal Medicine

## 2014-07-22 ENCOUNTER — Telehealth: Payer: Self-pay | Admitting: Internal Medicine

## 2014-07-22 NOTE — Telephone Encounter (Signed)
Pt would like a re-fill on lisdexamfetamine (VYVANSE) 50 MG capsule She would like to know if she can get a 3 month supply.

## 2014-07-26 MED ORDER — AMPHETAMINE-DEXTROAMPHETAMINE 20 MG PO TABS: 20.0000 mg | ORAL_TABLET | Freq: Two times a day (BID) | ORAL | Status: DC

## 2014-07-26 NOTE — Telephone Encounter (Signed)
Pt notified Rx ready for pickup. Rx printed and signed.  

## 2014-07-28 ENCOUNTER — Other Ambulatory Visit: Payer: Self-pay | Admitting: Dermatology

## 2014-08-02 ENCOUNTER — Encounter: Payer: Self-pay | Admitting: Sports Medicine

## 2014-08-02 ENCOUNTER — Ambulatory Visit (INDEPENDENT_AMBULATORY_CARE_PROVIDER_SITE_OTHER): Payer: BC Managed Care – PPO | Admitting: Sports Medicine

## 2014-08-02 VITALS — BP 135/76 | HR 84 | Ht 68.0 in | Wt 227.0 lb

## 2014-08-02 DIAGNOSIS — M545 Low back pain, unspecified: Secondary | ICD-10-CM

## 2014-08-02 NOTE — Progress Notes (Signed)
   Subjective:    Patient ID: Misty Benson, female    DOB: [birth year], 53 y.o.   MRN: [id number]  HPI Patient comes in today for followup on right-sided low back pain. She has started working with Barbaraann Barthel. Overall her symptoms have improved. She is currently going through menopause and as a result is having difficulty sleeping at night. This seems to exacerbate her pain. She notes that if she takes Ambien she is able to sleep better and does not have pain at night. She has also taken Celebrex in the past with good symptom relief. She was taking very high doses of Celebrex initially but has since weaned to taking it on an as-needed basis. Sounds like she may only be taking it a couple times a week but she does note that it does help with her pain. Today her pain is mainly along the lateral aspect of her right hip and thigh. As noted previously, she has had diagnostic injections into both the right SI joint as well as the right greater trochanteric bursa. Each injection did relieve some of her symptoms (hip pain improved with greater trochanteric bursa injection and low back pain improved with SI joint injection). She brought with her a copy of her x-rays and MRI of her lumbar spine for my review. She has minimal degenerative changes in the lumbar spine.    Review of Systems     Objective:   Physical Exam Well-developed, no acute distress  She is tender to palpation along the lateral right hip and down the right thigh. Negative log roll. Negative straight leg raise. Strength is 5/5 both lower extremities. Reflexes equal at the Achilles and patellar tendons.       Assessment & Plan:  Right hip pain likely secondary to greater trochanteric bursitis. Right SI joint dysfunction.  I recommended that she continue in physical therapy until ready for discharge per the therapist's discretion. I think she can safely increase her frequency of Celebrex use but I did caution her about chronic  daily use. She is asking about the possibility of a repeat cortisone injection into the lateral right hip. If symptoms persist or worsen then I would consider this at a later date. She has also been fitted in the past with arch supports and orthotics. I've recommended that she resume using these. Followup when necessary.

## 2014-08-10 ENCOUNTER — Ambulatory Visit (INDEPENDENT_AMBULATORY_CARE_PROVIDER_SITE_OTHER): Payer: BC Managed Care – PPO

## 2014-08-10 DIAGNOSIS — Z23 Encounter for immunization: Secondary | ICD-10-CM

## 2014-09-12 ENCOUNTER — Telehealth: Payer: Self-pay

## 2014-09-12 ENCOUNTER — Encounter: Payer: Self-pay | Admitting: Sports Medicine

## 2014-09-12 NOTE — Telephone Encounter (Signed)
According to pt's medication list is not on medication at present. Refill denied.

## 2014-09-12 NOTE — Telephone Encounter (Signed)
Rx request for Celexa 10 mg tablet- take one by mouth daily.  Pt last seen Dr. Arnoldo Morale on 6.10.15 for CPX.  Pt will establish with Dr. Raliegh Ip on 11.19.2015.  Pls advise.

## 2014-09-22 ENCOUNTER — Telehealth: Payer: Self-pay | Admitting: Internal Medicine

## 2014-09-22 MED ORDER — CITALOPRAM HYDROBROMIDE 10 MG PO TABS: 10.0000 mg | ORAL_TABLET | Freq: Every day | ORAL | Status: DC

## 2014-09-22 NOTE — Telephone Encounter (Signed)
Dr. Raliegh Ip, pt asking for Rx for Citalopram 10 mg one tablet daily, last prescribed by Dr. Arnoldo Morale in 2014. Pt scheduled to see you 11/19. Please advise if can fill?

## 2014-09-22 NOTE — Telephone Encounter (Signed)
Pt notified Rx sent to pharmacy

## 2014-09-22 NOTE — Telephone Encounter (Signed)
Pt has appt with dr Burnice Logan on 09-29-14 and needs citalopram 10 mg #90 sent to [facility] [address]

## 2014-09-22 NOTE — Telephone Encounter (Signed)
Ok to RF? 

## 2014-09-29 ENCOUNTER — Encounter: Payer: Self-pay | Admitting: *Deleted

## 2014-09-29 ENCOUNTER — Encounter: Payer: Self-pay | Admitting: Internal Medicine

## 2014-09-29 ENCOUNTER — Ambulatory Visit (INDEPENDENT_AMBULATORY_CARE_PROVIDER_SITE_OTHER): Payer: 59 | Admitting: Internal Medicine

## 2014-09-29 VITALS — BP 150/90 | HR 85 | Temp 98.4°F | Resp 20 | Ht 68.0 in | Wt 226.0 lb

## 2014-09-29 DIAGNOSIS — M545 Low back pain, unspecified: Secondary | ICD-10-CM

## 2014-09-29 DIAGNOSIS — I10 Essential (primary) hypertension: Secondary | ICD-10-CM

## 2014-09-29 NOTE — Progress Notes (Signed)
Pre visit review using our clinic review tool, if applicable. No additional management support is needed unless otherwise documented below in the visit note. 

## 2014-09-29 NOTE — Progress Notes (Signed)
Subjective:    Patient ID: Misty Benson, female    DOB: June 22, 1961, 53 y.o.   MRN: [id number]  HPI 53 year old patient who is seen today in transfer from Dr. Arnoldo Morale practice.  She has a long history of treated hypertension which has been stable.  She has ADHD, which has been well controlled on Adderall.  She has been seen by sports medicine recently due to right back and hip discomfort.  Evaluation has included a lumbar MRI.  Symptoms seem to be improving.  Otherwise, doing quite well.  Past Medical History  Diagnosis Date  . ADHD (attention deficit hyperactivity disorder)   . Obesity   . Hypertension   . Cancer     BASAL CELL FACE AND SHOULDERS , SQUAMOS CELL ON HAND AND MELANOMA LEFT ARM.Marland Kitchen.  . Cervix cancer 1987    microinvasive  . Leiomyoma 2013    multiple small  . Anxiety     History   Social History  . Marital Status: Married    Spouse Name: N/A    Number of Children: N/A  . Years of Education: N/A   Occupational History  . Not on file.   Social History Main Topics  . Smoking status: Never Smoker   . Smokeless tobacco: Never Used  . Alcohol Use: Yes     Comment: WINE   . Drug Use: No  . Sexual Activity:    Partners: Male    Birth Control/ Protection: Surgical     Comment: PARTNER WITH VASECTOMY   Other Topics Concern  . Not on file   Social History Narrative    Past Surgical History  Procedure Laterality Date  . Tonsillectomy      as a child  . Bunionectomy      LEFT FOOT WITH PINS  . Cesarean section    . Dilation and curettage of uterus      x2  . Cervical cone biopsy  1987  . Arm surgery      to remove melanoma on left upper outter arm  . Foot surgery      Family History  Problem Relation Age of Onset  . Stroke Mother   . Osteoporosis Mother   . Hyperlipidemia Father   . Atrial fibrillation Father   . Breast cancer Maternal Grandmother 2    No Known Allergies  Current Outpatient Prescriptions on File Prior to Visit  Medication  Sig Dispense Refill  . amphetamine-dextroamphetamine (ADDERALL) 20 MG tablet Take 1 tablet (20 mg total) by mouth 2 (two) times daily. 180 tablet 0  . calcium-vitamin D (OSCAL WITH D 500-200) 500-200 MG-UNIT per tablet Take 1 tablet by mouth daily.      . celecoxib (CELEBREX) 200 MG capsule Take 200 mg by mouth daily as needed.    . citalopram (CELEXA) 10 MG tablet Take 1 tablet (10 mg total) by mouth daily. 90 tablet 0  . fluticasone (FLONASE) 50 MCG/ACT nasal spray USE TWO SPRAYS IN EACH NOSTRIL DAILY  16 g 5  . hydrochlorothiazide (MICROZIDE) 12.5 MG capsule Take one capsule by mouth one time daily 90 capsule 3  . Polyethylene Glycol 3350 (MIRALAX PO) Take by mouth.    . verapamil (CALAN-SR) 240 MG CR tablet TAKE ONE TABLET BY MOUTH ONE TIME DAILY  90 tablet 0  . zolpidem (AMBIEN) 10 MG tablet Take 1 tablet (10 mg total) by mouth at bedtime as needed for sleep. 30 tablet 3   No current facility-administered medications on file prior  to visit.    BP 150/90 mmHg  Pulse 85  Temp(Src) 98.4 F (36.9 C) (Oral)  Resp 20  Ht 5\' 8"  (1.727 m)  Wt 226 lb (102.513 kg)  BMI 34.37 kg/m2  SpO2 96%      Review of Systems  Constitutional: Negative.   HENT: Negative for congestion, dental problem, hearing loss, rhinorrhea, sinus pressure, sore throat and tinnitus.   Eyes: Negative for pain, discharge and visual disturbance.  Respiratory: Negative for cough and shortness of breath.   Cardiovascular: Negative for chest pain, palpitations and leg swelling.  Gastrointestinal: Negative for nausea, vomiting, abdominal pain, diarrhea, constipation, blood in stool and abdominal distention.  Genitourinary: Negative for dysuria, urgency, frequency, hematuria, flank pain, vaginal bleeding, vaginal discharge, difficulty urinating, vaginal pain and pelvic pain.  Musculoskeletal: Positive for back pain. Negative for joint swelling, arthralgias and gait problem.  Skin: Negative for rash.  Neurological:  Negative for dizziness, syncope, speech difficulty, weakness, numbness and headaches.  Hematological: Negative for adenopathy.  Psychiatric/Behavioral: Negative for behavioral problems, dysphoric mood and agitation. The patient is not nervous/anxious.        Objective:   Physical Exam  Constitutional: She is oriented to person, place, and time. She appears well-developed and well-nourished.  HENT:  Head: Normocephalic.  Right Ear: External ear normal.  Left Ear: External ear normal.  Mouth/Throat: Oropharynx is clear and moist.  Eyes: Conjunctivae and EOM are normal. Pupils are equal, round, and reactive to light.  Neck: Normal range of motion. Neck supple. No thyromegaly present.  Cardiovascular: Normal rate, regular rhythm, normal heart sounds and intact distal pulses.   Pulmonary/Chest: Effort normal and breath sounds normal.  Abdominal: Soft. Bowel sounds are normal. She exhibits no mass. There is no tenderness.  Musculoskeletal: Normal range of motion.  Lymphadenopathy:    She has no cervical adenopathy.  Neurological: She is alert and oriented to person, place, and time.  Skin: Skin is warm and dry. No rash noted.  Psychiatric: She has a normal mood and affect. Her behavior is normal.          Assessment & Plan:   Hypertension, well-controlled History of anxiety, depression, stable ADHD, stable.  Continue Adderall Back pain.  Continue efforts at weight loss and rehabilitation  CPX 6 months

## 2014-09-29 NOTE — Patient Instructions (Signed)
Limit your sodium (Salt) intake    It is important that you exercise regularly, at least 20 minutes 3 to 4 times per week.  If you develop chest pain or shortness of breath seek  medical attention.  You need to lose weight.  Consider a lower calorie diet and regular exercise.  Return in 6 months for follow-up   

## 2014-09-30 ENCOUNTER — Telehealth: Payer: Self-pay | Admitting: Internal Medicine

## 2014-09-30 NOTE — Telephone Encounter (Signed)
emmi mailed  °

## 2014-10-10 ENCOUNTER — Other Ambulatory Visit: Payer: Self-pay

## 2014-10-10 MED ORDER — ZOLPIDEM TARTRATE 10 MG PO TABS: 10.0000 mg | ORAL_TABLET | Freq: Every evening | ORAL | Status: DC | PRN

## 2014-10-10 NOTE — Telephone Encounter (Signed)
Called into pharmacy

## 2014-10-13 ENCOUNTER — Ambulatory Visit (INDEPENDENT_AMBULATORY_CARE_PROVIDER_SITE_OTHER): Payer: 59 | Admitting: Gynecology

## 2014-10-13 ENCOUNTER — Encounter: Payer: Self-pay | Admitting: Gynecology

## 2014-10-13 DIAGNOSIS — N950 Postmenopausal bleeding: Secondary | ICD-10-CM

## 2014-10-13 NOTE — Progress Notes (Signed)
DEEDRA PRO May 08, 1961 [id number]        53 y.o.  [id number] Presents with 2 episodes of vaginal bleeding once in October and once in November. They lasted 7 days. Michela Pitcher it was almost like a regular period.  Did have some breast tenderness associated with it. LMP May 2014. No bleeding after that until now. Was having hot flashes but these seem to have resolved. Never started on estrogen patch as we had discussed previously.  Past medical history,surgical history, problem list, medications, allergies, family history and social history were all reviewed and documented in the EPIC chart.  Directed ROS with pertinent positives and negatives documented in the history of present illness/assessment and plan.  Exam: Kim assistant General appearance:  Normal Abdomen soft nontender without masses guarding rebound Ulrike external BUS vagina with atrophic changes. Cervix normal with light bleeding. Uterus grossly normal midline mobile nontender. Adnexa without masses or tenderness  Assessment/Plan:  53 y.o. [id number] with postmenopausal bleeding 2 episodes. Question whether secondary to escape ovulation. Start with sonohysterogram to rule out intracavitary abnormalities and allow endometrial sample.  Patient will schedule and follow up for this.     Anastasio Auerbach MD, 9:17 AM 10/13/2014

## 2014-10-13 NOTE — Patient Instructions (Addendum)
Follow up for ultrasound as scheduled.   Sonohysterogram A sonohysterogram is a procedure to examine the inside of your uterus. This exam uses sound waves sent to a computer to make real-time pictures of the inside of your uterus. To get the best images, a germ-free, saltwater solution (sterile saline) is injected into your uterus through your vagina. A sonohysterogram can show whether there is scarring or abnormal growths inside your uterus. It can also show whether your uterus is an abnormal shape or whether the lining is too thin.  LET [facility] CARE PROVIDER KNOW ABOUT:  Any allergies you have.  All medicines you are taking, including vitamins, herbs, eyedrops, creams, and over-the-counter medicines.  Previous problems you or members of your family have had with the use of anesthetics.  Any blood disorders you have.  Previous surgeries you have had.  Medical conditions you have.  The dates of your last period.  Possibility of pregnancy. RISKS AND COMPLICATIONS Generally, a sonohysterogram is a safe procedure. However, as with any procedure, problems can occur. Possible problems include:  Bleeding.  Infection. BEFORE THE PROCEDURE  Your health care provider may have you take an over-the-counter pain medicine.  You may get a prescription for antibiotic medicine.  Your health care provider may give you a pregnancy test before the procedure.  You will empty your bladder. PROCEDURE   You will lie down on the examining table with your knees raised or your feet in stirrups.  Your health care provider may do a pelvic exam before starting the procedure.  A slender, handheld device (transducer) will be lubricated and placed into your vagina.  The transducer will be positioned to send sound waves to your uterus.  The sound waves will bounce back to the transducer. They will be sent to a computer.  The computer will turn the sound waves into live images.  Your health  care provider will view the images on a screen during the procedure.  Your health care provider will remove the transducer from your vagina and use an instrument to widen the opening (speculum).  A swab will be used to clean the opening to your uterus (cervix).  A long, thin tube (catheter) will then be placed through your cervix into your uterus.  Your health care provider will fill your uterus with sterile saline through the catheter. You may feel some cramping.  The speculum will be removed.  The transducer will be placed back in your vagina to take more images. AFTER THE PROCEDURE After the procedure, it is typical to have light bleeding from your vagina, cramping, and watery vaginal discharge.  Document Released: 03/14/2014 Document Reviewed: 10/11/2013 [facility] Patient Information 2015 [place]. This information is not intended to replace advice given to you by your health care provider. Make sure you discuss any questions you have with your health care provider.

## 2014-10-17 ENCOUNTER — Other Ambulatory Visit: Payer: Self-pay

## 2014-10-17 MED ORDER — HYDROCHLOROTHIAZIDE 12.5 MG PO CAPS: 12.5000 mg | ORAL_CAPSULE | Freq: Every day | ORAL | Status: DC

## 2014-10-17 NOTE — Telephone Encounter (Signed)
Rx request for HCTZ 12.5 mg tablet- Take 1 capsule by mouth one time daily #90.    Rx sent to pharmacy.

## 2014-10-18 ENCOUNTER — Other Ambulatory Visit: Payer: Self-pay | Admitting: Gynecology

## 2014-10-18 DIAGNOSIS — N950 Postmenopausal bleeding: Secondary | ICD-10-CM

## 2014-10-25 ENCOUNTER — Telehealth: Payer: Self-pay | Admitting: Internal Medicine

## 2014-10-25 ENCOUNTER — Other Ambulatory Visit: Payer: Self-pay | Admitting: Internal Medicine

## 2014-10-25 NOTE — Telephone Encounter (Signed)
Left detailed message Rx refill sent to pharmacy.

## 2014-10-25 NOTE — Telephone Encounter (Signed)
Pt needs refill on verapamil 240 mg #90 w/refills [facility] [address] . Pt is out. Pt is aware md out of office this week

## 2014-10-28 ENCOUNTER — Ambulatory Visit (INDEPENDENT_AMBULATORY_CARE_PROVIDER_SITE_OTHER): Payer: 59 | Admitting: Gynecology

## 2014-10-28 ENCOUNTER — Ambulatory Visit (INDEPENDENT_AMBULATORY_CARE_PROVIDER_SITE_OTHER): Payer: 59

## 2014-10-28 ENCOUNTER — Encounter: Payer: Self-pay | Admitting: Gynecology

## 2014-10-28 DIAGNOSIS — N950 Postmenopausal bleeding: Secondary | ICD-10-CM

## 2014-10-28 DIAGNOSIS — N882 Stricture and stenosis of cervix uteri: Secondary | ICD-10-CM

## 2014-10-28 NOTE — Patient Instructions (Signed)
Office will call you with biopsy results.  Call me if you do any further bleeding.

## 2014-10-28 NOTE — Progress Notes (Signed)
DANIELLE MINK 03/05/1961 [id number]        53 y.o.  [id number] Presents for sonohysterogram due to 2 episodes of vaginal bleeding in October and November as noted in the 10/13/2014 note  Past medical history,surgical history, problem list, medications, allergies, family history and social history were all reviewed and documented in the EPIC chart.  Directed ROS with pertinent positives and negatives documented in the history of present illness/assessment and plan.  Ultrasound shows uterus grossly normal in size with multiple small myomas measuring 19, 10, 10, 7 mm. Endometrial echo 4.2 mm.  Right left ovaries normal. Cul-de-sac negative for fluid.  Exam: Pam Summer assistant General appearance:  Normal External BUS vagina with atrophic changes. Cervix with atrophic changes.  Procedure: Sonohysterogram was initiated with sterile cleansing of the cervix with Betadine. Initial attempt to pass the catheter unsuccessful due to cervical stenosis. A paracervical block using 1% lidocaine, 8 cc total was placed. Single-tooth tenaculum Intralipid stabilization. Using gradated dilators to start with the wire dilator the cervix was successively dilated to admit the sonohysterogram catheter and the sonohysterogram was performed with good distention skin no intracavitary abnormalities. Endometrial biopsy was taken. Patient tolerated well.  Assessment/Plan:  53 y.o. [id number] with episode of postmenopausal bleeding 2. Was associated with moliminal symptoms. Will follow up for biopsy results. Keep menstrual calendar and report any further bleeding. As long as she has no further bleeding will monitor.     Anastasio Auerbach MD, 3:31 PM 10/28/2014

## 2014-11-08 ENCOUNTER — Encounter: Payer: Self-pay | Admitting: Nurse Practitioner

## 2014-11-08 ENCOUNTER — Ambulatory Visit (INDEPENDENT_AMBULATORY_CARE_PROVIDER_SITE_OTHER): Payer: 59 | Admitting: Nurse Practitioner

## 2014-11-08 VITALS — BP 124/82 | HR 65 | Temp 98.7°F | Ht 68.0 in | Wt 225.0 lb

## 2014-11-08 DIAGNOSIS — J069 Acute upper respiratory infection, unspecified: Secondary | ICD-10-CM

## 2014-11-08 DIAGNOSIS — B9789 Other viral agents as the cause of diseases classified elsewhere: Principal | ICD-10-CM

## 2014-11-08 NOTE — Patient Instructions (Signed)
You have a virus causing your symptoms. The average duration of symptoms is 14 days. Start daily sinus rinses (neilmed Sinus Rinse). Benzocaine throat lozenges for sore throat. Thinned honey for cough. Continue mucinex- DM is not recommended because it makes mucous thicker & sticky. Sip fluids every hour. Rest. If you are not feeling better in 10 days or develop fever or chest pain, call us for re-evaluation. Feel better!  Upper Respiratory Infection, Adult An upper respiratory infection (URI) is also sometimes known as the common cold. The upper respiratory tract includes the nose, sinuses, throat, trachea, and bronchi. Bronchi are the airways leading to the lungs. Most people improve within 1 week, but symptoms can last up to 2 weeks. A residual cough may last even longer.  CAUSES Many different viruses can infect the tissues lining the upper respiratory tract. The tissues become irritated and inflamed and often become very moist. Mucus production is also common. A cold is contagious. You can easily spread the virus to others by oral contact. This includes kissing, sharing a glass, coughing, or sneezing. Touching your mouth or nose and then touching a surface, which is then touched by another person, can also spread the virus. SYMPTOMS  Symptoms typically develop 1 to 3 days after you come in contact with a cold virus. Symptoms vary from person to person. They may include:  Runny nose.  Sneezing.  Nasal congestion.  Sinus irritation.  Sore throat.  Loss of voice (laryngitis).  Cough.  Fatigue.  Muscle aches.  Loss of appetite.  Headache.  Low-grade fever. DIAGNOSIS  You might diagnose your own cold based on familiar symptoms, since most people get a cold 2 to 3 times a year. Your caregiver can confirm this based on your exam. Most importantly, your caregiver can check that your symptoms are not due to another disease such as strep throat, sinusitis, pneumonia, asthma, or  epiglottitis. Blood tests, throat tests, and X-rays are not necessary to diagnose a common cold, but they may sometimes be helpful in excluding other more serious diseases. Your caregiver will decide if any further tests are required. RISKS AND COMPLICATIONS  You may be at risk for a more severe case of the common cold if you smoke cigarettes, have chronic heart disease (such as heart failure) or lung disease (such as asthma), or if you have a weakened immune system. The very young and very old are also at risk for more serious infections. Bacterial sinusitis, middle ear infections, and bacterial pneumonia can complicate the common cold. The common cold can worsen asthma and chronic obstructive pulmonary disease (COPD). Sometimes, these complications can require emergency medical care and may be life-threatening. PREVENTION  The best way to protect against getting a cold is to practice good hygiene. Avoid oral or hand contact with people with cold symptoms. Wash your hands often if contact occurs. There is no clear evidence that vitamin C, vitamin E, echinacea, or exercise reduces the chance of developing a cold. However, it is always recommended to get plenty of rest and practice good nutrition. TREATMENT  Treatment is directed at relieving symptoms. There is no cure. Antibiotics are not effective, because the infection is caused by a virus, not by bacteria. Treatment may include:  Increased fluid intake. Sports drinks offer valuable electrolytes, sugars, and fluids.  Breathing heated mist or steam (vaporizer or shower).  Eating chicken soup or other clear broths, and maintaining good nutrition.  Getting plenty of rest.  Using gargles or lozenges for comfort.  Controlling fevers with ibuprofen or acetaminophen as directed by your caregiver.  Increasing usage of your inhaler if you have asthma. Zinc gel and zinc lozenges, taken in the first 24 hours of the common cold, can shorten the duration  and lessen the severity of symptoms. Pain medicines may help with fever, muscle aches, and throat pain. A variety of non-prescription medicines are available to treat congestion and runny nose. Your caregiver can make recommendations and may suggest nasal or lung inhalers for other symptoms.  HOME CARE INSTRUCTIONS   Only take over-the-counter or prescription medicines for pain, discomfort, or fever as directed by your caregiver.  Use a warm mist humidifier or inhale steam from a shower to increase air moisture. This may keep secretions moist and make it easier to breathe.  Drink enough water and fluids to keep your urine clear or pale yellow.  Rest as needed.  Return to work when your temperature has returned to normal or as your caregiver advises. You may need to stay home longer to avoid infecting others. You can also use a face mask and careful hand washing to prevent spread of the virus. SEEK MEDICAL CARE IF:   After the first few days, you feel you are getting worse rather than better.  You need your caregiver's advice about medicines to control symptoms.  You develop chills, worsening shortness of breath, or brown or red sputum. These may be signs of pneumonia.  You develop yellow or brown nasal discharge or pain in the face, especially when you bend forward. These may be signs of sinusitis.  You develop a fever, swollen neck glands, pain with swallowing, or white areas in the back of your throat. These may be signs of strep throat. SEEK IMMEDIATE MEDICAL CARE IF:   You have a fever.  You develop severe or persistent headache, ear pain, sinus pain, or chest pain.  You develop wheezing, a prolonged cough, cough up blood, or have a change in your usual mucus (if you have chronic lung disease).  You develop sore muscles or a stiff neck. Document Released: 04/23/2001 Document Revised: 01/20/2012 Document Reviewed: 03/01/2011 [facility] Patient Information 2014 [place], [place].

## 2014-11-08 NOTE — Progress Notes (Signed)
Pre visit review using our clinic review tool, if applicable. No additional management support is needed unless otherwise documented below in the visit note. 

## 2014-11-08 NOTE — Progress Notes (Signed)
   Subjective:    Patient ID: Misty Benson, female    DOB: 07-Feb-1961, 53 y.o.   MRN: [id number]  Cough This is a new problem. The current episode started 1 to 4 weeks ago (1 wk). The problem has been unchanged. The problem occurs hourly. The cough is productive of sputum. Associated symptoms include chest pain (L anterior CP w/cough), headaches, nasal congestion, a sore throat and wheezing (end expiration). Pertinent negatives include no chills, ear pain, fever or shortness of breath. Associated symptoms comments: Voice loss . Nothing aggravates the symptoms. Treatments tried: mucinex DM. The treatment provided mild relief.      Review of Systems  Constitutional: Negative for fever, chills, appetite change and fatigue.  HENT: Positive for congestion, sore throat and voice change. Negative for ear pain.   Respiratory: Positive for cough and wheezing (end expiration). Negative for chest tightness and shortness of breath.   Cardiovascular: Positive for chest pain (L anterior CP w/cough).  Neurological: Positive for headaches.       Objective:   Physical Exam  Constitutional: She is oriented to person, place, and time. She appears well-developed and well-nourished.  HENT:  Head: Normocephalic and atraumatic.  Right Ear: External ear normal.  Left Ear: External ear normal.  Mouth/Throat: Oropharynx is clear and moist. No oropharyngeal exudate.  Eyes: Conjunctivae are normal. Right eye exhibits no discharge. Left eye exhibits no discharge.  Neck: Normal range of motion. Neck supple. No thyromegaly present.  Cardiovascular: Normal rate, regular rhythm and normal heart sounds.   No murmur heard. Pulmonary/Chest: Effort normal and breath sounds normal. No respiratory distress. She has no wheezes. She has no rales.  Coarse breath sounds  Lymphadenopathy:    She has no cervical adenopathy.  Neurological: She is alert and oriented to person, place, and time.  Skin: Skin is warm and dry.    Psychiatric: She has a normal mood and affect. Her behavior is normal. Thought content normal.  Vitals reviewed.         Assessment & Plan:   1. Viral upper respiratory tract infection with cough Sinus rinse Supportive care F/u PRN

## 2014-11-14 ENCOUNTER — Telehealth: Payer: Self-pay | Admitting: Internal Medicine

## 2014-11-14 MED ORDER — AMPHETAMINE-DEXTROAMPHETAMINE 20 MG PO TABS: 20.0000 mg | ORAL_TABLET | Freq: Two times a day (BID) | ORAL | Status: DC

## 2014-11-14 NOTE — Telephone Encounter (Signed)
Patient needs a re-fill on amphetamine-dextroamphetamine (ADDERALL) 20 MG tablet. Ok to leave message on voice mail if needed.

## 2014-11-14 NOTE — Telephone Encounter (Signed)
Left detailed message Rx ready for pickup. Rx printed and signed. 

## 2014-11-17 ENCOUNTER — Other Ambulatory Visit: Payer: Self-pay | Admitting: Internal Medicine

## 2014-11-18 ENCOUNTER — Encounter: Payer: Self-pay | Admitting: Family Medicine

## 2014-11-18 ENCOUNTER — Ambulatory Visit (INDEPENDENT_AMBULATORY_CARE_PROVIDER_SITE_OTHER): Payer: 59 | Admitting: Family Medicine

## 2014-11-18 VITALS — BP 130/80 | HR 73 | Temp 98.0°F | Wt 225.0 lb

## 2014-11-18 DIAGNOSIS — J019 Acute sinusitis, unspecified: Secondary | ICD-10-CM

## 2014-11-18 DIAGNOSIS — J040 Acute laryngitis: Secondary | ICD-10-CM

## 2014-11-18 MED ORDER — AMOXICILLIN-POT CLAVULANATE 875-125 MG PO TABS: 1.0000 | ORAL_TABLET | Freq: Two times a day (BID) | ORAL | Status: DC

## 2014-11-18 NOTE — Patient Instructions (Signed)
Laryngitis At the top of your windpipe is your voice box. It is the source of your voice. Inside your voice box are 2 bands of muscles called vocal cords. When you breathe, your vocal cords are relaxed and open so that air can get into the lungs. When you decide to say something, these cords come together and vibrate. The sound from these vibrations goes into your throat and comes out through your mouth as sound. Laryngitis is an inflammation of the vocal cords that causes hoarseness, cough, loss of voice, sore throat, and dry throat. Laryngitis can be temporary (acute) or long-term (chronic). Most cases of acute laryngitis improve with time.Chronic laryngitis lasts for more than 3 weeks. CAUSES Laryngitis can often be related to excessive smoking, talking, or yelling, as well as inhalation of toxic fumes and allergies. Acute laryngitis is usually caused by a viral infection, vocal strain, measles or mumps, or bacterial infections. Chronic laryngitis is usually caused by vocal cord strain, vocal cord injury, postnasal drip, growths on the vocal cords, or acid reflux. SYMPTOMS   Cough.  Sore throat.  Dry throat. RISK FACTORS  Respiratory infections.  Exposure to irritating substances, such as cigarette smoke, excessive amounts of alcohol, stomach acids, and workplace chemicals.  Voice trauma, such as vocal cord injury from shouting or speaking too loud. DIAGNOSIS  Your cargiver will perform a physical exam. During the physical exam, your caregiver will examine your throat. The most common sign of laryngitis is hoarseness. Laryngoscopy may be necessary to confirm the diagnosis of this condition. This procedure allows your caregiver to look into the larynx. HOME CARE INSTRUCTIONS  Drink enough fluids to keep your urine clear or pale yellow.  Rest until you no longer have symptoms or as directed by your caregiver.  Breathe in moist air.  Take all medicine as directed by your  caregiver.  Do not smoke.  Talk as little as possible (this includes whispering).  Write on paper instead of talking until your voice is back to normal.  Follow up with your caregiver if your condition has not improved after 10 days. SEEK MEDICAL CARE IF:   You have trouble breathing.  You cough up blood.  You have persistent fever.  You have increasing pain.  You have difficulty swallowing. MAKE SURE YOU:  Understand these instructions.  Will watch your condition.  Will get help right away if you are not doing well or get worse. Document Released: 10/28/2005 Document Revised: 01/20/2012 Document Reviewed: 01/03/2011 [facility] Patient Information 2015 [place], [place]. This information is not intended to replace advice given to you by your health care provider. Make sure you discuss any questions you have with your health care provider.  Touch base if laryngitis symptoms not resolving after antibiotics.

## 2014-11-18 NOTE — Progress Notes (Signed)
Pre visit review using our clinic review tool, if applicable. No additional management support is needed unless otherwise documented below in the visit note. 

## 2014-11-18 NOTE — Progress Notes (Signed)
   Subjective:    Patient ID: Misty Benson, female    DOB: Oct 10, 1961, 54 y.o.   MRN: [id number]  HPI Acute visit. Patient seen with 18 day history of laryngitis. She's had some cough which is occasionally productive. She had some nasal congestion or this time all with intermittent headaches and diffuse facial pressure often unknown. Denies any GERD symptoms. Never smoked. No appetite or weight changes. She's taken occasional Sudafed without relief. She is using saline nasal irrigation.  Past Medical History  Diagnosis Date  . ADHD (attention deficit hyperactivity disorder)   . Obesity   . Hypertension   . Cancer     BASAL CELL FACE AND SHOULDERS , SQUAMOS CELL ON HAND AND MELANOMA LEFT ARM.Marland Kitchen.  . Cervix cancer 1987    microinvasive  . Leiomyoma 2013    multiple small  . Anxiety    Past Surgical History  Procedure Laterality Date  . Tonsillectomy      as a child  . Bunionectomy      LEFT FOOT WITH PINS  . Cesarean section    . Dilation and curettage of uterus      x2  . Cervical cone biopsy  1987  . Arm surgery      to remove melanoma on left upper outter arm  . Foot surgery      reports that she has never smoked. She has never used smokeless tobacco. She reports that she drinks alcohol. She reports that she does not use illicit drugs. family history includes Atrial fibrillation in her father; Breast cancer (age of onset: 57) in her maternal grandmother; Hyperlipidemia in her father; Osteoporosis in her mother; Stroke in her mother. No Known Allergies    Review of Systems  Constitutional: Negative for fever and chills.  HENT: Positive for congestion, sinus pressure and voice change.   Respiratory: Positive for cough.   Neurological: Positive for headaches.       Objective:   Physical Exam  Constitutional: She appears well-developed and well-nourished.  HENT:  Right Ear: External ear normal.  Left Ear: External ear normal.  Mouth/Throat: Oropharynx is clear and  moist.  Erythematous nasal mucosa otherwise clear  Neck: Neck supple.  Cardiovascular: Normal rate and regular rhythm.   Pulmonary/Chest: Effort normal and breath sounds normal. No respiratory distress. She has no wheezes. She has no rales.  Lymphadenopathy:    She has no cervical adenopathy.          Assessment & Plan:  Persistent laryngitis symptoms. Suspect related to acute sinusitis. Patient has never smoked. Start Augmentin 875 mg twice daily for 10 days. Consider ENT referral not resolving following antibiotics

## 2014-11-25 ENCOUNTER — Encounter: Payer: Self-pay | Admitting: Family Medicine

## 2014-11-25 ENCOUNTER — Telehealth: Payer: Self-pay | Admitting: Internal Medicine

## 2014-11-25 ENCOUNTER — Emergency Department (HOSPITAL_COMMUNITY): Payer: 59

## 2014-11-25 ENCOUNTER — Emergency Department (HOSPITAL_COMMUNITY)
Admission: EM | Admit: 2014-11-25 | Discharge: 2014-11-26 | Disposition: A | Discharge status: Home / Self Care | Payer: 59 | Attending: Emergency Medicine | Admitting: Emergency Medicine

## 2014-11-25 ENCOUNTER — Encounter (HOSPITAL_COMMUNITY): Payer: Self-pay | Admitting: *Deleted

## 2014-11-25 ENCOUNTER — Ambulatory Visit (INDEPENDENT_AMBULATORY_CARE_PROVIDER_SITE_OTHER): Payer: 59 | Admitting: Family Medicine

## 2014-11-25 VITALS — BP 124/82 | HR 76 | Temp 98.5°F | Wt 225.4 lb

## 2014-11-25 DIAGNOSIS — R079 Chest pain, unspecified: Secondary | ICD-10-CM

## 2014-11-25 DIAGNOSIS — E669 Obesity, unspecified: Secondary | ICD-10-CM | POA: Insufficient documentation

## 2014-11-25 DIAGNOSIS — Z85828 Personal history of other malignant neoplasm of skin: Secondary | ICD-10-CM | POA: Diagnosis not present

## 2014-11-25 DIAGNOSIS — Z8541 Personal history of malignant neoplasm of cervix uteri: Secondary | ICD-10-CM | POA: Diagnosis not present

## 2014-11-25 DIAGNOSIS — F419 Anxiety disorder, unspecified: Secondary | ICD-10-CM | POA: Insufficient documentation

## 2014-11-25 DIAGNOSIS — J069 Acute upper respiratory infection, unspecified: Secondary | ICD-10-CM | POA: Insufficient documentation

## 2014-11-25 DIAGNOSIS — J0181 Other acute recurrent sinusitis: Secondary | ICD-10-CM

## 2014-11-25 DIAGNOSIS — J40 Bronchitis, not specified as acute or chronic: Secondary | ICD-10-CM

## 2014-11-25 DIAGNOSIS — Z8742 Personal history of other diseases of the female genital tract: Secondary | ICD-10-CM | POA: Diagnosis not present

## 2014-11-25 DIAGNOSIS — Z79899 Other long term (current) drug therapy: Secondary | ICD-10-CM | POA: Insufficient documentation

## 2014-11-25 DIAGNOSIS — J040 Acute laryngitis: Secondary | ICD-10-CM

## 2014-11-25 LAB — BASIC METABOLIC PANEL
Anion gap: 7 (ref 5–15)
BUN: 22 mg/dL (ref 6–23)
CO2: 27 mmol/L (ref 19–32)
Calcium: 9.3 mg/dL (ref 8.4–10.5)
Chloride: 104 mEq/L (ref 96–112)
Creatinine, Ser: 0.66 mg/dL (ref 0.50–1.10)
GFR calc non Af Amer: >90 mL/min (ref >90)
GLUCOSE: 100 mg/dL — AB (ref 70–99)
Potassium: 3.8 mmol/L (ref 3.5–5.1)
SODIUM: 138 mmol/L (ref 135–145)

## 2014-11-25 LAB — CBC
HEMATOCRIT: 41 % (ref 36.0–46.0)
Hemoglobin: 13.5 g/dL (ref 12.0–15.0)
MCH: 30.1 pg (ref 26.0–34.0)
MCHC: 32.9 g/dL (ref 30.0–36.0)
MCV: 91.5 fL (ref 78.0–100.0)
Platelets: 311 10*3/uL (ref 150–400)
RBC: 4.48 MIL/uL (ref 3.87–5.11)
RDW: 12.7 % (ref 11.5–15.5)
WBC: 6.8 10*3/uL (ref 4.0–10.5)

## 2014-11-25 LAB — I-STAT TROPONIN, ED: TROPONIN I, POC: 0 ng/mL (ref 0.00–0.08)

## 2014-11-25 LAB — BRAIN NATRIURETIC PEPTIDE: B Natriuretic Peptide: 20.2 pg/mL (ref 0.0–100.0)

## 2014-11-25 MED ORDER — PREDNISONE 10 MG PO TABS: ORAL_TABLET | ORAL | Status: DC

## 2014-11-25 NOTE — Progress Notes (Signed)
   Subjective:    Patient ID: Misty Benson, female    DOB: January 11, 1961, 54 y.o.   MRN: [id number]  HPI  Patient seen with persistent laryngitis symptoms. She was started on Augmentin a few days ago for possible sinusitis. She is set up to see ENT 26th of January. She's had some progressive bilateral ear pressure and occasional sore throat. No fever. No purulent secretions. Some postnasal drainage. Still has some cough with sensation of thick mucus in her chest difficult to get up at times.  Review of Systems  Constitutional: Positive for fatigue. Negative for chills.  HENT: Positive for congestion, sinus pressure and voice change.   Respiratory: Positive for cough.   Cardiovascular: Negative for chest pain.  Neurological: Positive for headaches.       Objective:   Physical Exam  Constitutional: She appears well-developed and well-nourished.  HENT:  Right Ear: External ear normal.  Left Ear: External ear normal.  Mouth/Throat: Oropharynx is clear and moist.  Neck: Neck supple.  Cardiovascular: Normal rate and regular rhythm.   Pulmonary/Chest: Effort normal and breath sounds normal. No respiratory distress. She has no wheezes. She has no rales.  Lymphadenopathy:    She has no cervical adenopathy.          Assessment & Plan:  Laryngitis and sinusitis. Finish out Augmentin. She complains of bilateral ear pressure but ear exam is fairly normal. We've recommended trial of prednisone taper. Keep ENT appointment for later this month.

## 2014-11-25 NOTE — Progress Notes (Signed)
Pre visit review using our clinic review tool, if applicable. No additional management support is needed unless otherwise documented below in the visit note. 

## 2014-11-25 NOTE — Telephone Encounter (Signed)
i think the 56 th is OK for laryngitis symptoms unless they can see sooner.

## 2014-11-25 NOTE — Patient Instructions (Signed)
Laryngitis °At the top of your windpipe is your voice box. It is the source of your voice. Inside your voice box are 2 bands of muscles called vocal cords. When you breathe, your vocal cords are relaxed and open so that air can get into the lungs. When you decide to say something, these cords come together and vibrate. The sound from these vibrations goes into your throat and comes out through your mouth as sound.  °Laryngitis is an inflammation of the vocal cords that causes hoarseness, cough, loss of voice, sore throat, and dry throat. Laryngitis can be temporary (acute) or long-term (chronic). Most cases of acute laryngitis improve with time.Chronic laryngitis lasts for more than 3 weeks. °CAUSES °Laryngitis can often be related to excessive smoking, talking, or yelling, as well as inhalation of toxic fumes and allergies. Acute laryngitis is usually caused by a viral infection, vocal strain, measles or mumps, or bacterial infections. Chronic laryngitis is usually caused by vocal cord strain, vocal cord injury, postnasal drip, growths on the vocal cords, or acid reflux. °SYMPTOMS  °· Cough. °· Sore throat. °· Dry throat. °RISK FACTORS °· Respiratory infections. °· Exposure to irritating substances, such as cigarette smoke, excessive amounts of alcohol, stomach acids, and workplace chemicals. °· Voice trauma, such as vocal cord injury from shouting or speaking too loud. °DIAGNOSIS  °Your cargiver will perform a physical exam. During the physical exam, your caregiver will examine your throat. The most common sign of laryngitis is hoarseness. Laryngoscopy may be necessary to confirm the diagnosis of this condition. This procedure allows your caregiver to look into the larynx. °HOME CARE INSTRUCTIONS °· Drink enough fluids to keep your urine clear or pale yellow. °· Rest until you no longer have symptoms or as directed by your caregiver. °· Breathe in moist air. °· Take all medicine as directed by your  caregiver. °· Do not smoke. °· Talk as little as possible (this includes whispering). °· Write on paper instead of talking until your voice is back to normal. °· Follow up with your caregiver if your condition has not improved after 10 days. °SEEK MEDICAL CARE IF:  °· You have trouble breathing. °· You cough up blood. °· You have persistent fever. °· You have increasing pain. °· You have difficulty swallowing. °MAKE SURE YOU: °· Understand these instructions. °· Will watch your condition. °· Will get help right away if you are not doing well or get worse. °Document Released: 10/28/2005 Document Revised: 01/20/2012 Document Reviewed: 01/03/2011 °[employer]® Patient Information ©2015 [employer]. This information is not intended to replace advice given to you by your health care provider. Make sure you discuss any questions you have with your health care provider. ° °

## 2014-11-25 NOTE — ED Notes (Addendum)
Pt reports she has been treated for cold sxs x 4 weeks now.  Has been at [facility] and her PCP recently, was given rx for abx.  Pt reports she was told that it was laryngitis and URI but is not getting better.  She reports intermittent L cp, worse today.  Pt reports she feels like she can't get her breath.  Pt reports burning sensation in back of her neck and in bila ears.  Pt reports feeling flushed in bila ears and her head.  Pt states "it could just be anxiety."

## 2014-11-25 NOTE — Telephone Encounter (Signed)
Pt saw dr Elease Hashimoto 1/8. Pt instructed to cb if not better fir referral to ent. Pt would like to see dr Benjamine Mola   Su Raynelle Bring, MD, PA [address] [address], [address] [place], [place] [address] [phone] Pt could not get in until 12/06/14. Pt would like to know if dr burchette put in a referral she could get in any sooner.  Pt has laryngitis that has not gotten any better w/ the antibiotics.  Pt has had this for 25 days.

## 2014-11-25 NOTE — Telephone Encounter (Signed)
Pt aware.

## 2014-11-25 NOTE — Telephone Encounter (Signed)
Pt decided to come in today. Ears are beginning to hurt.

## 2014-11-26 NOTE — Discharge Instructions (Signed)
Read the information below.  You may return to the Emergency Department at any time for worsening condition or any new symptoms that concern you.   If you develop worsening chest pain, shortness of breath, fever, you pass out, or become weak or dizzy, return to the ER for a recheck.    °

## 2014-11-26 NOTE — ED Provider Notes (Signed)
CSN: [id number]     Arrival date & time 11/25/14  1953 History   First MD Initiated Contact with Patient 11/25/14 2321     Chief Complaint  Patient presents with  . Chest Pain  . URI     (Consider location/radiation/quality/duration/timing/severity/associated sxs/prior Treatment) HPI   Pt presents with nonproductive cough, sore throat, hoard voice, occasional ear pain and headache x 4 weeks.  Had episode of CP and SOB that lasted about 20 minutes today that has now completely resolved.  Was seen by PCP a little over a week ago and was given Augmentin, is on day 8 of 10.  Also taking several OTC medications and albuterol inhaler.  Saw her PCP today and had prednisone added, has not filled prescription yet.  Denies leg swelling, exogenous estrogen use, recent immobilization.  Her illness began with sick contacts with her son and husband with similar symptoms.   Past Medical History  Diagnosis Date  . ADHD (attention deficit hyperactivity disorder)   . Obesity   . Hypertension   . Cancer     BASAL CELL FACE AND SHOULDERS , SQUAMOS CELL ON HAND AND MELANOMA LEFT ARM.Marland Kitchen.  . Cervix cancer 1987    microinvasive  . Leiomyoma 2013    multiple small  . Anxiety    Past Surgical History  Procedure Laterality Date  . Tonsillectomy      as a child  . Bunionectomy      LEFT FOOT WITH PINS  . Cesarean section    . Dilation and curettage of uterus      x2  . Cervical cone biopsy  1987  . Arm surgery      to remove melanoma on left upper outter arm  . Foot surgery     Family History  Problem Relation Age of Onset  . Stroke Mother   . Osteoporosis Mother   . Hyperlipidemia Father   . Atrial fibrillation Father   . Breast cancer Maternal Grandmother 67   History  Substance Use Topics  . Smoking status: Never Smoker   . Smokeless tobacco: Never Used  . Alcohol Use: Yes     Comment: WINE    OB History    Gravida Para Term Preterm AB TAB SAB Ectopic Multiple Living   7 3   4  4   3       Review of Systems  All other systems reviewed and are negative.     Allergies  Review of patient's allergies indicates no known allergies.  Home Medications   Prior to Admission medications   Medication Sig Start Date End Date Taking? Authorizing Provider  amoxicillin-clavulanate (AUGMENTIN) 875-125 MG per tablet Take 1 tablet by mouth 2 (two) times daily. 11/18/14  Yes Eulas Post, MD  amphetamine-dextroamphetamine (ADDERALL) 20 MG tablet Take 1 tablet (20 mg total) by mouth 2 (two) times daily. 11/14/14  Yes Marletta Lor, MD  calcium-vitamin D (OSCAL WITH D 500-200) 500-200 MG-UNIT per tablet Take 1 tablet by mouth daily.     Yes Historical Provider, MD  citalopram (CELEXA) 10 MG tablet Take 1 tablet (10 mg total) by mouth daily. 09/22/14  Yes Marletta Lor, MD  fluticasone [facility]) 50 MCG/ACT nasal spray USE TWO SPRAYS IN [facility] NOSTRIL DAILY 11/17/14  Yes Marletta Lor, MD  hydrochlorothiazide (MICROZIDE) 12.5 MG capsule Take 1 capsule (12.5 mg total) by mouth daily. 10/17/14  Yes Marletta Lor, MD  verapamil (CALAN-SR) 240 MG CR tablet TAKE ONE TABLET  BY MOUTH ONE TIME DAILY  10/25/14  Yes Marletta Lor, MD  zolpidem (AMBIEN) 10 MG tablet Take 1 tablet (10 mg total) by mouth at bedtime as needed for sleep. 10/10/14 12/20/14 Yes Anastasio Auerbach, MD  celecoxib (CELEBREX) 200 MG capsule Take 200 mg by mouth daily as needed.    Historical Provider, MD  Polyethylene Glycol 3350 (MIRALAX PO) Take 1 packet by mouth daily as needed. For constipation    Historical Provider, MD  predniSONE (DELTASONE) 10 MG tablet Taper as follows: 4-4-3-3-2-2-1-1 11/25/14   Eulas Post, MD   BP 145/84 mmHg  Pulse 65  Temp(Src) 98.8 F (37.1 C) (Oral)  Resp 18  SpO2 96%  LMP 05/30/2012 Physical Exam  Constitutional: She appears well-developed and well-nourished. No distress.  HENT:  Head: Normocephalic and atraumatic.  Mouth/Throat: Oropharynx is clear and  moist. No oropharyngeal exudate.  Eyes: Conjunctivae are normal.  Neck: Normal range of motion. Neck supple.  Cardiovascular: Normal rate and regular rhythm.   Pulmonary/Chest: Effort normal and breath sounds normal. No stridor. No respiratory distress. She has no wheezes. She has no rales.  Abdominal: Soft. She exhibits no distension. There is no tenderness. There is no rebound and no guarding.  Musculoskeletal: She exhibits no edema.  Lymphadenopathy:    She has no cervical adenopathy.  Neurological: She is alert.  Skin: She is not diaphoretic.  Nursing note and vitals reviewed.   ED Course  Procedures (including critical care time) Labs Review Labs Reviewed  BASIC METABOLIC PANEL - Abnormal; Notable for the following:    Glucose, Bld 100 (*)    All other components within normal limits  CBC  BRAIN NATRIURETIC PEPTIDE  I-STAT TROPOININ, ED    Imaging Review Dg Chest 2 View  11/25/2014   CLINICAL DATA:  Chest pain, history of cough and cold symptoms.  EXAM: CHEST  2 VIEW  COMPARISON:  None.  FINDINGS: The cardiomediastinal contours are normal. The lungs are clear. Pulmonary vasculature is normal. No consolidation, pleural effusion, or pneumothorax. No acute osseous abnormalities are seen. There is mild degenerative disc disease in the spine.  IMPRESSION: No acute pulmonary process.   Electronically Signed   By: Jeb Levering M.D.   On: 11/25/2014 21:09     EKG Interpretation None        Date: @EDTODAY (<PARAMETER> error)@  Rate: 71  Rhythm: normal sinus rhythm  QRS Axis: normal  Intervals: normal  ST/T Wave abnormalities: normal  Conduction Disutrbances:none  Narrative Interpretation: wandering baseline  Old EKG Reviewed: none available    MDM   Final diagnoses:  Bronchitis    Afebrile, nontoxic patient with 4 weeks of URI/bronchitis symptoms.  Labs, CXR, EKG unremarkable.  Pt feeling very reassured and feeling better, requested d/c home when I first saw her.   Declined any further medications.    D/C home with PCP follow up.  Discussed result, findings, treatment, and follow up  with patient.  Pt given return precautions.  Pt verbalizes understanding and agrees with plan.         Clayton Bibles, PA-C 11/26/14 0100  Julianne Rice, MD 11/26/14 [phone]

## 2014-11-29 ENCOUNTER — Encounter: Payer: Self-pay | Admitting: Podiatrist

## 2014-11-29 ENCOUNTER — Ambulatory Visit (INDEPENDENT_AMBULATORY_CARE_PROVIDER_SITE_OTHER): Payer: 59 | Admitting: Podiatrist

## 2014-11-29 VITALS — BP 141/98 | HR 101 | Resp 16

## 2014-11-29 DIAGNOSIS — L03032 Cellulitis of left toe: Secondary | ICD-10-CM

## 2014-11-29 DIAGNOSIS — L03012 Cellulitis of left finger: Secondary | ICD-10-CM

## 2014-11-29 MED ORDER — EFINACONAZOLE 10 % EX SOLN: 1.0000 [drp] | Freq: Every day | CUTANEOUS | Status: DC

## 2014-11-29 NOTE — Progress Notes (Signed)
  Chief Complaint  Patient presents with  . Toe Pain    1st toe left - medial border   "I think I may have an ingrown toenail"     HPI: Patient is 54 y.o. female who presents today for an ingrown toenail on the left hallux and for some discoloration on the right great toenail.  She has tried no treatment for either condition.    No Known Allergies  Physical Exam  Patient is awake, alert, and oriented x 3.  In no acute distress.  Vascular status is intact with palpable pedal pulses at 2/4 DP and PT bilateral and capillary refill time within normal limits. Neurological sensation is also intact bilaterally via Semmes Weinstein monofilament at 5/5 sites. Light touch, vibratory sensation, Achilles tendon reflex is intact. Dermatological exam reveals skin color, turger and texture as normal. No open lesions present.  Musculature intact with dorsiflexion, plantarflexion, inversion, eversion.  Left hallux nail medial nail border is inflammed and appears infected.  Mild redness, and swelling is noted with pain occurring on pressure of the nail plate on the lateral nail border.  Right hallux nail is yellowish brown discoloration with subungual debris present mild dystropy also present.  Clinical concern for mycotic infection is present.   Assessment: left hallux lateral nail border paronychia, mycocotic right hallux nail  Plan: Treatment options and alternatives discussed.  Recommended an incision and drainage and patient agreed.  left  Great toe was prepped with alcohol and a 1 to 1 mix of 0.5% marcaine plain and 2% lidocaine plain was administered in a digital block fashion.  The toe was then prepped with betadine solution and exsanguinated.  The offending nail border was then excised and all necrotic tissue was resected.  The area was then cleansed well and antibiotic ointment and a dry sterile dressing was applied.  The patient was dispensed instructions for aftercare. A prescription for topical jublia  was also dispensed for her use.  If any concerns arise with this medication she is to call.  If she see's any increase in redness, swelling or increased drainage, she will call.

## 2014-11-29 NOTE — Patient Instructions (Addendum)

## 2014-12-13 ENCOUNTER — Other Ambulatory Visit: Payer: Self-pay

## 2014-12-13 MED ORDER — ZOLPIDEM TARTRATE 10 MG PO TABS: 10.0000 mg | ORAL_TABLET | Freq: Every evening | ORAL | Status: DC | PRN

## 2014-12-13 NOTE — Telephone Encounter (Signed)
Called into pharmacy

## 2014-12-18 ENCOUNTER — Other Ambulatory Visit: Payer: Self-pay | Admitting: Internal Medicine

## 2015-01-06 ENCOUNTER — Other Ambulatory Visit: Payer: Self-pay

## 2015-01-06 NOTE — Telephone Encounter (Signed)
Called pharmacy and notifed refusal as well.

## 2015-01-06 NOTE — Telephone Encounter (Signed)
RGCE was due in January 2016. No appt currently scheduled.

## 2015-01-13 ENCOUNTER — Telehealth: Payer: Self-pay | Admitting: *Deleted

## 2015-01-13 NOTE — Telephone Encounter (Signed)
(  pt aware you are out of the office) Pt has annual scheduled on 02/22/15 requesting refill on Ambien 10 mg. Please advise

## 2015-01-15 NOTE — Telephone Encounter (Signed)
[place] for # 30 with no refills

## 2015-01-16 MED ORDER — ZOLPIDEM TARTRATE 10 MG PO TABS: 10.0000 mg | ORAL_TABLET | Freq: Every evening | ORAL | Status: DC | PRN

## 2015-01-16 NOTE — Telephone Encounter (Signed)
Rx called in, left on voicemail rx called in

## 2015-01-26 ENCOUNTER — Other Ambulatory Visit: Payer: Self-pay | Admitting: Dermatology

## 2015-02-13 ENCOUNTER — Other Ambulatory Visit: Payer: Self-pay | Admitting: Dermatology

## 2015-02-22 ENCOUNTER — Ambulatory Visit (INDEPENDENT_AMBULATORY_CARE_PROVIDER_SITE_OTHER): Payer: 59 | Admitting: Gynecology

## 2015-02-22 ENCOUNTER — Encounter: Payer: Self-pay | Admitting: Gynecology

## 2015-02-22 VITALS — BP 130/76 | Ht 69.0 in | Wt 230.0 lb

## 2015-02-22 DIAGNOSIS — Z01419 Encounter for gynecological examination (general) (routine) without abnormal findings: Secondary | ICD-10-CM | POA: Diagnosis not present

## 2015-02-22 DIAGNOSIS — N816 Rectocele: Secondary | ICD-10-CM

## 2015-02-22 LAB — CBC WITH DIFFERENTIAL/PLATELET
BASOS PCT: 1 % (ref 0–1)
Basophils Absolute: 0.1 10*3/uL (ref 0.0–0.1)
Eosinophils Absolute: 0.2 10*3/uL (ref 0.0–0.7)
Eosinophils Relative: 3 % (ref 0–5)
HCT: 41.2 % (ref 36.0–46.0)
HEMOGLOBIN: 14 g/dL (ref 12.0–15.0)
Lymphocytes Relative: 25 % (ref 12–46)
Lymphs Abs: 1.5 10*3/uL (ref 0.7–4.0)
MCH: 30.6 pg (ref 26.0–34.0)
MCHC: 34 g/dL (ref 30.0–36.0)
MCV: 90 fL (ref 78.0–100.0)
MONOS PCT: 9 % (ref 3–12)
MPV: 9.9 fL (ref 8.6–12.4)
Monocytes Absolute: 0.5 10*3/uL (ref 0.1–1.0)
NEUTROS PCT: 62 % (ref 43–77)
Neutro Abs: 3.8 10*3/uL (ref 1.7–7.7)
PLATELETS: 276 10*3/uL (ref 150–400)
RBC: 4.58 MIL/uL (ref 3.87–5.11)
RDW: 13.4 % (ref 11.5–15.5)
WBC: 6.1 10*3/uL (ref 4.0–10.5)

## 2015-02-22 LAB — COMPREHENSIVE METABOLIC PANEL
ALBUMIN: 4.2 g/dL (ref 3.5–5.2)
ALT: 19 U/L (ref 0–35)
AST: 15 U/L (ref 0–37)
Alkaline Phosphatase: 64 U/L (ref 39–117)
BUN: 18 mg/dL (ref 6–23)
CALCIUM: 9.5 mg/dL (ref 8.4–10.5)
CHLORIDE: 101 meq/L (ref 96–112)
CO2: 25 mEq/L (ref 19–32)
Creat: 0.63 mg/dL (ref 0.50–1.10)
Glucose, Bld: 89 mg/dL (ref 70–99)
POTASSIUM: 4.6 meq/L (ref 3.5–5.3)
SODIUM: 138 meq/L (ref 135–145)
Total Bilirubin: 0.4 mg/dL (ref 0.2–1.2)
Total Protein: 6.6 g/dL (ref 6.0–8.3)

## 2015-02-22 LAB — LIPID PANEL
CHOLESTEROL: 220 mg/dL — AB (ref 0–200)
HDL: 82 mg/dL (ref >=46)
LDL Cholesterol: 112 mg/dL — ABNORMAL HIGH (ref 0–99)
Total CHOL/HDL Ratio: 2.7 Ratio
Triglycerides: 131 mg/dL (ref <150)
VLDL: 26 mg/dL (ref 0–40)

## 2015-02-22 LAB — TSH: TSH: 1.625 u[IU]/mL (ref 0.350–4.500)

## 2015-02-22 NOTE — Patient Instructions (Signed)
You may obtain a copy of any labs that were done today by logging onto MyChart as outlined in the instructions provided with your AVS (after visit summary). The office will not call with normal lab results but certainly if there are any significant abnormalities then we will contact you.   Health Maintenance, Female A healthy lifestyle and preventative care can promote health and wellness.  Maintain regular health, dental, and eye exams.  Eat a healthy diet. Foods like vegetables, fruits, whole grains, low-fat dairy products, and lean protein foods contain the nutrients you need without too many calories. Decrease your intake of foods high in solid fats, added sugars, and salt. Get information about a proper diet from your caregiver, if necessary.  Regular physical exercise is one of the most important things you can do for your health. Most adults should get at least 150 minutes of moderate-intensity exercise (any activity that increases your heart rate and causes you to sweat) each week. In addition, most adults need muscle-strengthening exercises on 2 or more days a week.   Maintain a healthy weight. The body mass index (BMI) is a screening tool to identify possible weight problems. It provides an estimate of body fat based on height and weight. Your caregiver can help determine your BMI, and can help you achieve or maintain a healthy weight. For adults 20 years and older:  A BMI below 18.5 is considered underweight.  A BMI of 18.5 to 24.9 is normal.  A BMI of 25 to 29.9 is considered overweight.  A BMI of 30 and above is considered obese.  Maintain normal blood lipids and cholesterol by exercising and minimizing your intake of saturated fat. Eat a balanced diet with plenty of fruits and vegetables. Blood tests for lipids and cholesterol should begin at age 61 and be repeated every 5 years. If your lipid or cholesterol levels are high, you are over 50, or you are a high risk for heart  disease, you may need your cholesterol levels checked more frequently.Ongoing high lipid and cholesterol levels should be treated with medicines if diet and exercise are not effective.  If you smoke, find out from your caregiver how to quit. If you do not use tobacco, do not start.  Lung cancer screening is recommended for adults aged 33 80 years who are at high risk for developing lung cancer because of a history of smoking. Yearly low-dose computed tomography (CT) is recommended for people who have at least a 30-pack-year history of smoking and are a current smoker or have quit within the past 15 years. A pack year of smoking is smoking an average of 1 pack of cigarettes a day for 1 year (for example: 1 pack a day for 30 years or 2 packs a day for 15 years). Yearly screening should continue until the smoker has stopped smoking for at least 15 years. Yearly screening should also be stopped for people who develop a health problem that would prevent them from having lung cancer treatment.  If you are pregnant, do not drink alcohol. If you are breastfeeding, be very cautious about drinking alcohol. If you are not pregnant and choose to drink alcohol, do not exceed 1 drink per day. One drink is considered to be 12 ounces (355 mL) of beer, 5 ounces (148 mL) of wine, or 1.5 ounces (44 mL) of liquor.  Avoid use of street drugs. Do not share needles with anyone. Ask for help if you need support or instructions about stopping  the use of drugs.  High blood pressure causes heart disease and increases the risk of stroke. Blood pressure should be checked at least every 1 to 2 years. Ongoing high blood pressure should be treated with medicines, if weight loss and exercise are not effective.  If you are 59 to 55 years old, ask your caregiver if you should take aspirin to prevent strokes.  Diabetes screening involves taking a blood sample to check your fasting blood sugar level. This should be done once every 3  years, after age 91, if you are within normal weight and without risk factors for diabetes. Testing should be considered at a younger age or be carried out more frequently if you are overweight and have at least 1 risk factor for diabetes.  Breast cancer screening is essential preventative care for women. You should practice "breast self-awareness." This means understanding the normal appearance and feel of your breasts and may include breast self-examination. Any changes detected, no matter how small, should be reported to a caregiver. Women in their 66s and 30s should have a clinical breast exam (CBE) by a caregiver as part of a regular health exam every 1 to 3 years. After age 101, women should have a CBE every year. Starting at age 100, women should consider having a mammogram (breast X-ray) every year. Women who have a family history of breast cancer should talk to their caregiver about genetic screening. Women at a high risk of breast cancer should talk to their caregiver about having an MRI and a mammogram every year.  Breast cancer gene (BRCA)-related cancer risk assessment is recommended for women who have family members with BRCA-related cancers. BRCA-related cancers include breast, ovarian, tubal, and peritoneal cancers. Having family members with these cancers may be associated with an increased risk for harmful changes (mutations) in the breast cancer genes BRCA1 and BRCA2. Results of the assessment will determine the need for genetic counseling and BRCA1 and BRCA2 testing.  The Pap test is a screening test for cervical cancer. Women should have a Pap test starting at age 57. Between ages 25 and 35, Pap tests should be repeated every 2 years. Beginning at age 37, you should have a Pap test every 3 years as long as the past 3 Pap tests have been normal. If you had a hysterectomy for a problem that was not cancer or a condition that could lead to cancer, then you no longer need Pap tests. If you are  between ages 50 and 76, and you have had normal Pap tests going back 10 years, you no longer need Pap tests. If you have had past treatment for cervical cancer or a condition that could lead to cancer, you need Pap tests and screening for cancer for at least 20 years after your treatment. If Pap tests have been discontinued, risk factors (such as a new sexual partner) need to be reassessed to determine if screening should be resumed. Some women have medical problems that increase the chance of getting cervical cancer. In these cases, your caregiver may recommend more frequent screening and Pap tests.  The human papillomavirus (HPV) test is an additional test that may be used for cervical cancer screening. The HPV test looks for the virus that can cause the cell changes on the cervix. The cells collected during the Pap test can be tested for HPV. The HPV test could be used to screen women aged 44 years and older, and should be used in women of any age  who have unclear Pap test results. After the age of 55, women should have HPV testing at the same frequency as a Pap test.  Colorectal cancer can be detected and often prevented. Most routine colorectal cancer screening begins at the age of 44 and continues through age 20. However, your caregiver may recommend screening at an earlier age if you have risk factors for colon cancer. On a yearly basis, your caregiver may provide home test kits to check for hidden blood in the stool. Use of a small camera at the end of a tube, to directly examine the colon (sigmoidoscopy or colonoscopy), can detect the earliest forms of colorectal cancer. Talk to your caregiver about this at age 86, when routine screening begins. Direct examination of the colon should be repeated every 5 to 10 years through age 13, unless early forms of pre-cancerous polyps or small growths are found.  Hepatitis C blood testing is recommended for all people born from [birth year] through 1966 and any  individual with known risks for hepatitis C.  Practice safe sex. Use condoms and avoid high-risk sexual practices to reduce the spread of sexually transmitted infections (STIs). Sexually active women aged 36 and younger should be checked for Chlamydia, which is a common sexually transmitted infection. Older women with new or multiple partners should also be tested for Chlamydia. Testing for other STIs is recommended if you are sexually active and at increased risk.  Osteoporosis is a disease in which the bones lose minerals and strength with aging. This can result in serious bone fractures. The risk of osteoporosis can be identified using a bone density scan. Women ages 20 and over and women at risk for fractures or osteoporosis should discuss screening with their caregivers. Ask your caregiver whether you should be taking a calcium supplement or vitamin D to reduce the rate of osteoporosis.  Menopause can be associated with physical symptoms and risks. Hormone replacement therapy is available to decrease symptoms and risks. You should talk to your caregiver about whether hormone replacement therapy is right for you.  Use sunscreen. Apply sunscreen liberally and repeatedly throughout the day. You should seek shade when your shadow is shorter than you. Protect yourself by wearing long sleeves, pants, a wide-brimmed hat, and sunglasses year round, whenever you are outdoors.  Notify your caregiver of new moles or changes in moles, especially if there is a change in shape or color. Also notify your caregiver if a mole is larger than the size of a pencil eraser.  Stay current with your immunizations. Document Released: 05/13/2011 Document Revised: 02/22/2013 Document Reviewed: 05/13/2011 [facility] Patient Information 2014 [employer].

## 2015-02-22 NOTE — Progress Notes (Addendum)
Misty Benson 26-Jan-1961 [id number]        54 y.o.  [id number] for annual exam.  Several issues noted below.  Past medical history,surgical history, problem list, medications, allergies, family history and social history were all reviewed and documented as reviewed in the EPIC chart.  ROS:  Performed with pertinent positives and negatives included in the history, assessment and plan.   Additional significant findings :  none   Exam: Kim Counsellor Vitals:   02/22/15 1121  BP: 130/76  Height: 5\' 9"  (1.753 m)  Weight: 230 lb (104.327 kg)   General appearance:  Normal affect, orientation and appearance. Skin: Grossly normal HEENT: Without gross lesions.  No cervical or supraclavicular adenopathy. Thyroid normal.  Lungs:  Clear without wheezing, rales or rhonchi Cardiac: RR, without RMG Abdominal:  Soft, nontender, without masses, guarding, rebound, organomegaly or hernia Breasts:  Examined lying and sitting without masses, retractions, discharge or axillary adenopathy. Pelvic:  Ext/BUS/vagina normal excepting slight rectocele.  Cervix normal  Uterus anteverted, normal size, shape and contour, midline and mobile nontender   Adnexa  Without masses or tenderness    Anus and perineum  Normal   Rectovaginal  Normal sphincter tone without palpated masses or tenderness. Mild rectocele noted.   Assessment/Plan:  [name] female for annual exam.   1. Postmenopausal. History of postmenopausal bleeding end of last year. No bleeding since then. Workup to include senna hysterogram and biopsy negative. Report any bleeding. Was having hot flashes and sleep disturbances but these seem to have resolved and she is sleeping well without sleep aids. Follow up if any issues but otherwise will plan observation for now. 2. Mild rectocele. Patient asymptomatic without pressure, stool trapping or other issues. We'll follow expectantly and patient will report any issues. 3. Pap smear/HPV 11/2013  negative. No Pap smear done today. No history of significant abnormal Pap smears. 4. Mammography 11/2013.  Patient knows she is overdue and agrees to call and schedule. SBE monthly reviewed. 5. Colonoscopy 2011. Repeat at their recommended interval. 6. DEXA never. Plan further into the menopause. Check vitamin D level today. 7. Health maintenance. Baseline CBC, comprehensive metabolic panel, lipid profile, urinalysis, TSH and vitamin D ordered. Follow up in one year, sooner as needed.     Anastasio Auerbach MD, 12:00 PM 02/22/2015

## 2015-02-23 ENCOUNTER — Other Ambulatory Visit: Payer: Self-pay | Admitting: Gynecology

## 2015-02-23 DIAGNOSIS — E7800 Pure hypercholesterolemia, unspecified: Secondary | ICD-10-CM

## 2015-02-23 LAB — URINALYSIS W MICROSCOPIC + REFLEX CULTURE
Bacteria, UA: NONE SEEN
Bilirubin Urine: NEGATIVE
CASTS: NONE SEEN
Crystals: NONE SEEN
GLUCOSE, UA: NEGATIVE mg/dL
Hgb urine dipstick: NEGATIVE
Ketones, ur: NEGATIVE mg/dL
Leukocytes, UA: NEGATIVE
Nitrite: NEGATIVE
PH: 6 (ref 5.0–8.0)
Protein, ur: NEGATIVE mg/dL
SPECIFIC GRAVITY, URINE: 1.018 (ref 1.005–1.030)
Squamous Epithelial / LPF: NONE SEEN
Urobilinogen, UA: 0.2 mg/dL (ref 0.0–1.0)

## 2015-02-23 LAB — VITAMIN D 25 HYDROXY (VIT D DEFICIENCY, FRACTURES): VIT D 25 HYDROXY: 28 ng/mL — AB (ref 30–100)

## 2015-02-24 ENCOUNTER — Other Ambulatory Visit: Payer: 59

## 2015-04-16 ENCOUNTER — Other Ambulatory Visit: Payer: Self-pay | Admitting: Internal Medicine

## 2015-04-18 ENCOUNTER — Other Ambulatory Visit: Payer: 59

## 2015-04-24 ENCOUNTER — Encounter: Payer: 59 | Admitting: Internal Medicine

## 2015-04-25 ENCOUNTER — Encounter: Payer: 59 | Admitting: Internal Medicine

## 2015-04-27 ENCOUNTER — Telehealth: Payer: Self-pay | Admitting: Internal Medicine

## 2015-04-27 MED ORDER — AMPHETAMINE-DEXTROAMPHETAMINE 20 MG PO TABS: 20.0000 mg | ORAL_TABLET | Freq: Two times a day (BID) | ORAL | Status: DC

## 2015-04-27 MED ORDER — AMPHETAMINE-DEXTROAMPHETAMINE 20 MG PO TABS
20.0000 mg | ORAL_TABLET | Freq: Two times a day (BID) | ORAL | Status: DC
Start: 2015-04-27 — End: 2015-09-26

## 2015-04-27 NOTE — Telephone Encounter (Signed)
Pt called in to req refill on Adderall. She also stated that she prefer 30 day dosage or 60 pills for twice per day dosage. She stated she is trying to cut back on needing/using the meds altogether.

## 2015-04-27 NOTE — Telephone Encounter (Signed)
Spoke to pt, and asked her if she wants 3 Rx's for one month supply each? Pt said yes that would be fine. Told pt okay Rx's will be ready later today. PT verbalized understanding. Rx's printed and signed.

## 2015-05-23 ENCOUNTER — Encounter: Payer: 59 | Admitting: Internal Medicine

## 2015-05-25 ENCOUNTER — Other Ambulatory Visit: Payer: Self-pay

## 2015-05-25 MED ORDER — ZOLPIDEM TARTRATE 10 MG PO TABS: 10.0000 mg | ORAL_TABLET | Freq: Every evening | ORAL | Status: DC | PRN

## 2015-05-25 NOTE — Telephone Encounter (Signed)
Rx called in to pharmacy. 

## 2015-06-17 ENCOUNTER — Other Ambulatory Visit: Payer: Self-pay | Admitting: Internal Medicine

## 2015-06-24 ENCOUNTER — Other Ambulatory Visit: Payer: Self-pay | Admitting: Internal Medicine

## 2015-06-26 ENCOUNTER — Telehealth: Payer: Self-pay | Admitting: *Deleted

## 2015-06-26 MED ORDER — ZOLPIDEM TARTRATE 10 MG PO TABS: 10.0000 mg | ORAL_TABLET | Freq: Every evening | ORAL | Status: DC | PRN

## 2015-06-26 NOTE — Telephone Encounter (Signed)
Pt has one refill left on Ambien 10 mg 1 po daily #30 asked if refill can be sent to new pharmacy. Rx will be call into [facility], pt said [facility] has a new Designer[facility] [employer]

## 2015-07-06 ENCOUNTER — Ambulatory Visit (INDEPENDENT_AMBULATORY_CARE_PROVIDER_SITE_OTHER): Payer: 59 | Admitting: Internal Medicine

## 2015-07-06 ENCOUNTER — Encounter: Payer: Self-pay | Admitting: Internal Medicine

## 2015-07-06 VITALS — BP 116/70 | HR 64 | Temp 98.6°F | Resp 20 | Ht 68.0 in | Wt 232.0 lb

## 2015-07-06 DIAGNOSIS — Z0000 Encounter for general adult medical examination without abnormal findings: Secondary | ICD-10-CM

## 2015-07-06 DIAGNOSIS — I10 Essential (primary) hypertension: Secondary | ICD-10-CM | POA: Diagnosis not present

## 2015-07-06 DIAGNOSIS — F909 Attention-deficit hyperactivity disorder, unspecified type: Secondary | ICD-10-CM | POA: Diagnosis not present

## 2015-07-06 DIAGNOSIS — F988 Other specified behavioral and emotional disorders with onset usually occurring in childhood and adolescence: Secondary | ICD-10-CM

## 2015-07-06 MED ORDER — HYDROCHLOROTHIAZIDE 12.5 MG PO CAPS: 12.5000 mg | ORAL_CAPSULE | Freq: Every day | ORAL | Status: DC

## 2015-07-06 NOTE — Progress Notes (Signed)
Subjective:    Patient ID: Misty Benson, female    DOB: 01/12/1961, 54 y.o.   MRN: [id number]  HPI  54 year-old patient who is seen today for a preventive health examination.   She has a long history of treated hypertension which has been stable.  She has ADHD, which has been well controlled on Adderall.  She has been seen by sports medicine  due to right back and hip discomfort.  Evaluation has included a lumbar MRI.  Symptoms seem to be stable.  Otherwise, doing quite well.  Patient is followed by endocrinology and dermatology twice annually.  She has a history of melanoma times 2  Colonoscopy 2011  Family history.  Father age 35 in remarkably good health, possibly diet-controlled dyslipidemia.  Mother age 62.  Hypertension.  Osteoporosis.  History of TIA.  One brother with hypertension  Social history.  Married 3 children.  Nonsmoker  Past Medical History  Diagnosis Date  . ADHD (attention deficit hyperactivity disorder)   . Obesity   . Hypertension   . Cancer     BASAL CELL FACE AND SHOULDERS , SQUAMOS CELL ON HAND AND MELANOMA LEFT ARM.Marland Kitchen.  . Cervix cancer 1987    microinvasive  . Leiomyoma 2013    multiple small  . Anxiety   . Melanoma     Social History   Social History  . Marital Status: Married    Spouse Name: N/A  . Number of Children: N/A  . Years of Education: N/A   Occupational History  . Not on file.   Social History Main Topics  . Smoking status: Never Smoker   . Smokeless tobacco: Never Used  . Alcohol Use: 2.4 oz/week    4 Standard drinks or equivalent per week     Comment: WINE   . Drug Use: No  . Sexual Activity:    Partners: Male    Birth Control/ Protection: Surgical     Comment: PARTNER WITH VASECTOMY-1st intercourse 19 yo-5 partners   Other Topics Concern  . Not on file   Social History Narrative    Past Surgical History  Procedure Laterality Date  . Tonsillectomy      as a child  . Bunionectomy      LEFT FOOT WITH PINS  .  Cesarean section    . Dilation and curettage of uterus      x2  . Cervical cone biopsy  1987  . Arm surgery      to remove melanoma on left upper outter arm  . Foot surgery    . Melanoma excision      Family History  Problem Relation Age of Onset  . Stroke Mother   . Osteoporosis Mother   . Hyperlipidemia Father   . Atrial fibrillation Father   . Breast cancer Maternal Grandmother 86    No Known Allergies  Current Outpatient Prescriptions on File Prior to Visit  Medication Sig Dispense Refill  . amphetamine-dextroamphetamine (ADDERALL) 20 MG tablet Take 1 tablet (20 mg total) by mouth 2 (two) times daily. 60 tablet 0  . amphetamine-dextroamphetamine (ADDERALL) 20 MG tablet Take 1 tablet (20 mg total) by mouth 2 (two) times daily. 60 tablet 0  . amphetamine-dextroamphetamine (ADDERALL) 20 MG tablet Take 1 tablet (20 mg total) by mouth 2 (two) times daily. 60 tablet 0  . Azelastine-Fluticasone 137-50 MCG/ACT SUSP Place into the nose.    . calcium-vitamin D (OSCAL WITH D 500-200) 500-200 MG-UNIT per tablet Take 1 tablet  by mouth daily.      . celecoxib (CELEBREX) 200 MG capsule Take 200 mg by mouth daily as needed.    . citalopram (CELEXA) 10 MG tablet TAKE ONE TABLET BY MOUTH ONE TIME DAILY 30 tablet 6  . Efinaconazole 10 % SOLN Apply 1 drop topically daily. 8 mL 2  . omeprazole (PRILOSEC) 20 MG capsule Take 20 mg by mouth daily.    . Polyethylene Glycol 3350 (MIRALAX PO) Take 1 packet by mouth daily as needed. For constipation    . verapamil (CALAN-SR) 240 MG CR tablet TAKE ONE TABLET BY MOUTH ONE TIME DAILY 90 tablet 1  . zolpidem (AMBIEN) 10 MG tablet Take 1 tablet (10 mg total) by mouth at bedtime as needed for sleep. 30 tablet 0   No current facility-administered medications on file prior to visit.    BP 116/70 mmHg  Pulse 64  Temp(Src) 98.6 F (37 C) (Oral)  Resp 20  Ht 5\' 8"  (1.727 m)  Wt 232 lb (105.235 kg)  BMI 35.28 kg/m2  SpO2 98%  LMP  05/30/2012      Review of Systems  Constitutional: Negative.   HENT: Negative for congestion, dental problem, hearing loss, rhinorrhea, sinus pressure, sore throat and tinnitus.   Eyes: Negative for pain, discharge and visual disturbance.  Respiratory: Negative for cough and shortness of breath.   Cardiovascular: Negative for chest pain, palpitations and leg swelling.  Gastrointestinal: Negative for nausea, vomiting, abdominal pain, diarrhea, constipation, blood in stool and abdominal distention.  Genitourinary: Negative for dysuria, urgency, frequency, hematuria, flank pain, vaginal bleeding, vaginal discharge, difficulty urinating, vaginal pain and pelvic pain.  Musculoskeletal: Positive for back pain. Negative for joint swelling, arthralgias and gait problem.  Skin: Negative for rash.  Neurological: Negative for dizziness, syncope, speech difficulty, weakness, numbness and headaches.  Hematological: Negative for adenopathy.  Psychiatric/Behavioral: Negative for behavioral problems, dysphoric mood and agitation. The patient is not nervous/anxious.        Objective:   Physical Exam  Constitutional: She is oriented to person, place, and time. She appears well-developed and well-nourished.  HENT:  Head: Normocephalic.  Right Ear: External ear normal.  Left Ear: External ear normal.  Mouth/Throat: Oropharynx is clear and moist.  Eyes: Conjunctivae and EOM are normal. Pupils are equal, round, and reactive to light.  Neck: Normal range of motion. Neck supple. No thyromegaly present.  Cardiovascular: Normal rate, regular rhythm, normal heart sounds and intact distal pulses.   Pulmonary/Chest: Effort normal and breath sounds normal.  Abdominal: Soft. Bowel sounds are normal. She exhibits no mass. There is no tenderness.  Musculoskeletal: Normal range of motion.  Lymphadenopathy:    She has no cervical adenopathy.  Neurological: She is alert and oriented to person, place, and time.   Skin: Skin is warm and dry. No rash noted.  Psychiatric: She has a normal mood and affect. Her behavior is normal.          Assessment & Plan:   Hypertension, well-controlled History of anxiety, depression, stable ADHD, stable.  Continue Adderall Obesity.  Weight loss encouraged  Follow-up 6 months

## 2015-07-06 NOTE — Patient Instructions (Signed)
Limit your sodium (Salt) intake    It is important that you exercise regularly, at least 20 minutes 3 to 4 times per week.  If you develop chest pain or shortness of breath seek  medical attention.  You need to lose weight.  Consider a lower calorie diet and regular exercise.  Cardiac Diet This diet can help prevent heart disease and stroke. Many factors influence your heart health, including eating and exercise habits. Coronary risk rises a lot with abnormal blood fat (lipid) levels. Cardiac meal planning includes limiting unhealthy fats, increasing healthy fats, and making other small dietary changes. General guidelines are as follows:  Adjust calorie intake to reach and maintain desirable body weight.  Limit total fat intake to less than 30% of total calories. Saturated fat should be less than 7% of calories.  Saturated fats are found in animal products and in some vegetable products. Saturated vegetable fats are found in coconut oil, cocoa butter, palm oil, and palm kernel oil. Read labels carefully to avoid these products as much as possible. Use butter in moderation. Choose tub margarines and oils that have 2 grams of fat or less. Good cooking oils are canola and olive oils.  Practice low-fat cooking techniques. Do not fry food. Instead, broil, bake, boil, steam, grill, roast on a rack, stir-fry, or microwave it. Other fat reducing suggestions include:  Remove the skin from poultry.  Remove all visible fat from meats.  Skim the fat off stews, soups, and gravies before serving them.  Steam vegetables in water or broth instead of sauting them in fat.  Avoid foods with trans fat (or hydrogenated oils), such as commercially fried foods and commercially baked goods. Commercial shortening and deep-frying fats will contain trans fat.  Increase intake of fruits, vegetables, whole grains, and legumes to replace foods high in fat.  Increase consumption of nuts, legumes, and seeds to at  least 4 servings weekly. One serving of a legume equals  cup, and 1 serving of nuts or seeds equals  cup.  Choose whole grains more often. Have 3 servings per day (a serving is 1 ounce [oz]).  Eat 4 to 5 servings of vegetables per day. A serving of vegetables is 1 cup of raw leafy vegetables;  cup of raw or cooked cut-up vegetables;  cup of vegetable juice.  Eat 4 to 5 servings of fruit per day. A serving of fruit is 1 medium whole fruit;  cup of dried fruit;  cup of fresh, frozen, or canned fruit;  cup of 100% fruit juice.  Increase your intake of dietary fiber to 20 to 30 grams per day. Insoluble fiber may help lower your risk of heart disease and may help curb your appetite.  Soluble fiber binds cholesterol to be removed from the blood. Foods high in soluble fiber are dried beans, citrus fruits, oats, apples, bananas, broccoli, Brussels sprouts, and eggplant.  Try to include foods fortified with plant sterols or stanols, such as yogurt, breads, juices, or margarines. Choose several fortified foods to achieve a daily intake of 2 to 3 grams of plant sterols or stanols.  Foods with omega-3 fats can help reduce your risk of heart disease. Aim to have a 3.5 oz portion of fatty fish twice per week, such as salmon, mackerel, albacore tuna, sardines, lake trout, or herring. If you wish to take a fish oil supplement, choose one that contains 1 gram of both DHA and EPA.  Limit processed meats to 2 servings (3 oz portion)  weekly.  Limit the sodium in your diet to 1500 milligrams (mg) per day. If you have high blood pressure, talk to a registered dietitian about a DASH (Dietary Approaches to Stop Hypertension) eating plan.  Limit sweets and beverages with added sugar, such as soda, to no more than 5 servings per week. One serving is:   1 tablespoon sugar.  1 tablespoon jelly or jam.   cup sorbet.  1 cup lemonade.   cup regular soda. CHOOSING FOODS Starches  Allowed: Breads: All  kinds (wheat, rye, raisin, white, oatmeal, [place], [place], and English muffin bread). Low-fat rolls: English muffins, frankfurter and hamburger buns, bagels, pita bread, tortillas (not fried). Pancakes, waffles, biscuits, and muffins made with recommended oil.  Avoid: Products made with saturated or trans fats, oils, or whole milk products. Butter rolls, cheese breads, croissants. Commercial doughnuts, muffins, sweet rolls, biscuits, waffles, pancakes, store-bought mixes. Crackers  Allowed: Low-fat crackers and snacks: Animal, graham, rye, saltine (with recommended oil, no lard), oyster, and matzo crackers. Bread sticks, melba toast, rusks, flatbread, pretzels, and light popcorn.  Avoid: High-fat crackers: cheese crackers, butter crackers, and those made with coconut, palm oil, or trans fat (hydrogenated oils). Buttered popcorn. Cereals  Allowed: Hot or cold whole-grain cereals.  Avoid: Cereals containing coconut, hydrogenated vegetable fat, or animal fat. Potatoes / Pasta / Rice  Allowed: All kinds of potatoes, rice, and pasta (such as macaroni, spaghetti, and noodles).  Avoid: Pasta or rice prepared with cream sauce or high-fat cheese. Chow mein noodles, [place] fries. Vegetables  Allowed: All vegetables and vegetable juices.  Avoid: Fried vegetables. Vegetables in cream, butter, or high-fat cheese sauces. Limit coconut. Fruit in cream or custard. Protein  Allowed: Limit your intake of meat, seafood, and poultry to no more than 6 oz (cooked weight) per day. All lean, well-trimmed beef, veal, pork, and lamb. All chicken and [place] without skin. All fish and shellfish. Wild game: wild duck, rabbit, pheasant, and venison. Egg whites or low-cholesterol egg substitutes may be used as desired. Meatless dishes: recipes with dried beans, peas, lentils, and tofu (soybean curd). Seeds and nuts: all seeds and most nuts.  Avoid: Prime grade and other heavily marbled and fatty meats, such as short  ribs, spare ribs, rib eye roast or steak, frankfurters, sausage, bacon, and high-fat luncheon meats, mutton. Caviar. Commercially fried fish. Domestic duck, goose, venison sausage. Organ meats: liver, gizzard, heart, chitterlings, brains, kidney, sweetbreads. Dairy  Allowed: Low-fat cheeses: nonfat or low-fat cottage cheese (1% or 2% fat), cheeses made with part skim milk, such as mozzarella, farmers, string, or ricotta. (Cheeses should be labeled no more than 2 to 6 grams fat per oz.). Skim (or 1%) milk: liquid, powdered, or evaporated. Buttermilk made with low-fat milk. Drinks made with skim or low-fat milk or cocoa. Chocolate milk or cocoa made with skim or low-fat (1%) milk. Nonfat or low-fat yogurt.  Avoid: Whole milk cheeses, including colby, cheddar, mu[place], Monterey Jack, [place], [place], [place], American, Swiss, and blue. Creamed cottage cheese, cream cheese. Whole milk and whole milk products, including buttermilk or yogurt made from whole milk, drinks made from whole milk. Condensed milk, evaporated whole milk, and 2% milk. Soups and Combination Foods  Allowed: Low-fat low-sodium soups: broth, dehydrated soups, homemade broth, soups with the fat removed, homemade cream soups made with skim or low-fat milk. Low-fat spaghetti, lasagna, chili, and Spanish rice if low-fat ingredients and low-fat cooking techniques are used.  Avoid: Cream soups made with whole milk, cream, or high-fat cheese. All  other soups. Desserts and Sweets  Allowed: Sherbet, fruit ices, gelatins, meringues, and angel food cake. Homemade desserts with recommended fats, oils, and milk products. Jam, jelly, honey, marmalade, sugars, and syrups. Pure sugar candy, such as gum drops, hard candy, jelly beans, marshmallows, mints, and small amounts of dark chocolate.  Avoid: Commercially prepared cakes, pies, cookies, frosting, pudding, or mixes for these products. Desserts containing whole milk products, chocolate, coconut,  lard, palm oil, or palm kernel oil. Ice cream or ice cream drinks. Candy that contains chocolate, coconut, butter, hydrogenated fat, or unknown ingredients. Buttered syrups. Fats and Oils  Allowed: Vegetable oils: safflower, sunflower, corn, soybean, cottonseed, sesame, canola, olive, or peanut. Non-hydrogenated margarines. Salad dressing or mayonnaise: homemade or commercial, made with a recommended oil. Low or nonfat salad dressing or mayonnaise.  Limit added fats and oils to 6 to 8 tsp per day (includes fats used in cooking, baking, salads, and spreads on bread). Remember to count the "hidden fats" in foods.  Avoid: Solid fats and shortenings: butter, lard, salt pork, bacon drippings. Gravy containing meat fat, shortening, or suet. Cocoa butter, coconut. Coconut oil, palm oil, palm kernel oil, or hydrogenated oils: these ingredients are often used in bakery products, nondairy creamers, whipped toppings, candy, and commercially fried foods. Read labels carefully. Salad dressings made of unknown oils, sour cream, or cheese, such as blue cheese and Roquefort. Cream, all kinds: half-and-half, light, heavy, or whipping. Sour cream or cream cheese (even if "light" or low-fat). Nondairy cream substitutes: coffee creamers and sour cream substitutes made with palm, palm kernel, hydrogenated oils, or coconut oil. Beverages  Allowed: Coffee (regular or decaffeinated), tea. Diet carbonated beverages, mineral water. Alcohol: Check with your caregiver. Moderation is recommended.  Avoid: Whole milk, regular sodas, and juice drinks with added sugar. Condiments  Allowed: All seasonings and condiments. Cocoa powder. "Cream" sauces made with recommended ingredients.  Avoid: Carob powder made with hydrogenated fats. SAMPLE MENU Breakfast   cup orange juice   cup oatmeal  1 slice toast  1 tsp margarine  1 cup skim milk Lunch  [place] sandwich with 2 oz [place], 2 slices bread  Lettuce and tomato  slices  Fresh fruit  Carrot sticks  Coffee or tea Snack  Fresh fruit or low-fat crackers Dinner  3 oz lean ground beef  1 baked potato  1 tsp margarine   cup asparagus  Lettuce salad  1 tbs non-creamy dressing   cup peach slices  1 cup skim milk Document Released: 08/06/2008 Document Revised: 04/28/2012 Document Reviewed: 12/28/2013 ExitCare Patient Information 2015 [place]. This information is not intended to replace advice given to you by your health care provider. Make sure you discuss any questions you have with your health care provider.

## 2015-07-06 NOTE — Progress Notes (Signed)
Pre visit review using our clinic review tool, if applicable. No additional management support is needed unless otherwise documented below in the visit note. 

## 2015-07-12 ENCOUNTER — Telehealth: Payer: Self-pay | Admitting: *Deleted

## 2015-07-12 MED ORDER — EFINACONAZOLE 10 % EX SOLN: 1.0000 [drp] | Freq: Every day | CUTANEOUS | Status: DC

## 2015-07-12 NOTE — Telephone Encounter (Addendum)
Prior approval form was completed for Jublia, as previous rx, on medication for over 180 days, but provider could not confirm if pt had improvement, dx B35.1, no testing to confirm the dx, and not for cosmetic purposes, no use of Penlac, Itraconazole, Lamisil was faxed.  Dr. Paulla Dolly states if pt needs confirmation of onychomycosis through testing will need to make an appt.  New orders sent to [facility] due to Dr. Valentina Lucks no longer practicing with [facility].

## 2015-07-18 ENCOUNTER — Telehealth: Payer: Self-pay | Admitting: *Deleted

## 2015-07-18 NOTE — Telephone Encounter (Addendum)
Left message informing pt I would try to reach her on the mobile.  Left message on mobile informing pt I had information concerning Jublia.  Dr. Paulla Dolly states if pt needs confirmation of + fungal test she needs to schedule to be established with a new doctor and be tested.  [employer] denied approval for [employer].  Left message informing pt the Jublia had been denied by [facility], and if she wanted to pursue treatment she would need to schedule an appt to be tested for fungal toenails.

## 2015-08-03 ENCOUNTER — Other Ambulatory Visit: Payer: Self-pay | Admitting: Podiatrist

## 2015-09-17 ENCOUNTER — Emergency Department (INDEPENDENT_AMBULATORY_CARE_PROVIDER_SITE_OTHER)
Admission: EM | Admit: 2015-09-17 | Discharge: 2015-09-17 | Disposition: A | Discharge status: Home / Self Care | Payer: 59 | Source: Home / Self Care | Attending: Family Medicine | Admitting: Family Medicine

## 2015-09-17 ENCOUNTER — Encounter: Payer: Self-pay | Admitting: Emergency Medicine

## 2015-09-17 DIAGNOSIS — R059 Cough, unspecified: Secondary | ICD-10-CM

## 2015-09-17 DIAGNOSIS — J069 Acute upper respiratory infection, unspecified: Secondary | ICD-10-CM

## 2015-09-17 DIAGNOSIS — J040 Acute laryngitis: Secondary | ICD-10-CM

## 2015-09-17 DIAGNOSIS — R05 Cough: Secondary | ICD-10-CM

## 2015-09-17 MED ORDER — AMOXICILLIN 875 MG PO TABS: 875.0000 mg | ORAL_TABLET | Freq: Two times a day (BID) | ORAL | Status: DC

## 2015-09-17 MED ORDER — METHYLPREDNISOLONE SODIUM SUCC 125 MG IJ SOLR
80.0000 mg | Freq: Once | INTRAMUSCULAR | Status: AC
  Administered 2015-09-17: 80 mg via INTRAMUSCULAR

## 2015-09-17 MED ORDER — ALBUTEROL SULFATE HFA 108 (90 BASE) MCG/ACT IN AERS: 1.0000 | INHALATION_SPRAY | Freq: Four times a day (QID) | RESPIRATORY_TRACT | Status: DC | PRN

## 2015-09-17 MED ORDER — BENZONATATE 100 MG PO CAPS: 100.0000 mg | ORAL_CAPSULE | Freq: Three times a day (TID) | ORAL | Status: DC

## 2015-09-17 MED ORDER — PREDNISONE 20 MG PO TABS: ORAL_TABLET | ORAL | Status: DC

## 2015-09-17 NOTE — Discharge Instructions (Signed)
You were given a shot of solumedrol (a steroid) today to help with airway and sinus inflammation to help with cough, hoarse voice and help with feeling of sinus "fullness"  You may start the oral steroid, Prednisone, tomorrow.  You may also find relief by using over the counter sinus rinses such as a [employer].    Please try to give the prednisone, tessalon, and albuterol inhaler time to work, about 5-7 days.  If symptoms not improving, or continue to worsen, you may fill the antibiotic prescription- Amoxicillin.  If you start taking the antibiotic, please complete the entire course to ensure infection is treated completely and lessen the chance of infection coming back.

## 2015-09-17 NOTE — ED Notes (Signed)
Pt c/o laryngitis, cold x 3 days, dry cough, wheezing, coughing worse at night.  Can't sleep.

## 2015-09-17 NOTE — ED Provider Notes (Signed)
CSN: [id number]     Arrival date & time 09/17/15  1108 History   First MD Initiated Contact with Patient 09/17/15 1135     Chief Complaint  Patient presents with  . Cough   (Consider location/radiation/quality/duration/timing/severity/associated sxs/prior Treatment) HPI Pt is a 54yo female presenting to [facility] with c/o gradually worsening cough with congestion, hoarse voice, bilateral ear pressure, moderate intermittent dry cough.  Cough is worse at night and keeps her up at night.  Pt states she cannot sleep due to the cough. She states her husband had similar symptoms last week and got her sick.  Pt reports hx of laryngitis last year and wound up having prednisone twice, once from her PCP and a second course prescribed by her ENT provider, Dr. Benjamine Mola.  Pt has also been on omeprazole in the past for her laryngitis but states this time, she feels "cold like symptoms"  Pt c/o fatigue and body aches.  Denies fever, n/v/d. Denies hx of asthma. Denies hx of DM. Denies chest pain or SOB at his time. No recent travel.   Past Medical History  Diagnosis Date  . ADHD (attention deficit hyperactivity disorder)   . Obesity   . Hypertension   . Cancer ([place])     BASAL CELL FACE AND SHOULDERS , SQUAMOS CELL ON HAND AND MELANOMA LEFT ARM.Marland Kitchen.  . Cervix cancer ([place]) 1987    microinvasive  . Leiomyoma 2013    multiple small  . Anxiety   . Melanoma [facility])    Past Surgical History  Procedure Laterality Date  . Tonsillectomy      as a child  . Bunionectomy      LEFT FOOT WITH PINS  . Cesarean section    . Dilation and curettage of uterus      x2  . Cervical cone biopsy  1987  . Arm surgery      to remove melanoma on left upper outter arm  . Foot surgery    . Melanoma excision     Family History  Problem Relation Age of Onset  . Stroke Mother   . Osteoporosis Mother   . Hyperlipidemia Father   . Atrial fibrillation Father   . Breast cancer Maternal Grandmother 91   Social History  Substance Use  Topics  . Smoking status: Never Smoker   . Smokeless tobacco: Never Used  . Alcohol Use: 2.4 oz/week    4 Standard drinks or equivalent per week     Comment: WINE    OB History    Gravida Para Term Preterm AB TAB SAB Ectopic Multiple Living   7 3   4  4   3      Review of Systems  Constitutional: Positive for fatigue. Negative for fever and chills.  HENT: Positive for congestion, ear pain ( bilateral, pressure), rhinorrhea, sore throat and voice change. Negative for trouble swallowing.   Respiratory: Positive for cough and wheezing. Negative for shortness of breath and stridor.   Cardiovascular: Negative for chest pain and palpitations.  Gastrointestinal: Negative for nausea, vomiting, abdominal pain and diarrhea.  Musculoskeletal: Positive for myalgias and arthralgias. Negative for back pain.  Skin: Negative for rash.  All other systems reviewed and are negative.   Allergies  Review of patient's allergies indicates no known allergies.  Home Medications   Prior to Admission medications   Medication Sig Start Date End Date Taking? Authorizing Provider  albuterol (PROVENTIL HFA;VENTOLIN HFA) 108 (90 BASE) MCG/ACT inhaler Inhale 1-2 puffs into the lungs  every 6 (six) hours as needed for wheezing or shortness of breath. 09/17/15   Noland Fordyce, PA-C  amoxicillin (AMOXIL) 875 MG tablet Take 1 tablet (875 mg total) by mouth 2 (two) times daily. For 7 days 09/17/15   Noland Fordyce, PA-C  amphetamine-dextroamphetamine (ADDERALL) 20 MG tablet Take 1 tablet (20 mg total) by mouth 2 (two) times daily. 04/27/15   Marletta Lor, MD  benzonatate (TESSALON) 100 MG capsule Take 1 capsule (100 mg total) by mouth every 8 (eight) hours. 09/17/15   Noland Fordyce, PA-C  citalopram (CELEXA) 10 MG tablet TAKE ONE TABLET BY MOUTH ONE TIME DAILY 06/26/15   Marletta Lor, MD  hydrochlorothiazide (MICROZIDE) 12.5 MG capsule Take 1 capsule (12.5 mg total) by mouth daily. 07/06/15   Marletta Lor,  MD  predniSONE (DELTASONE) 20 MG tablet 3 tabs po day one, then 2 po daily x 4 days 09/17/15   Noland Fordyce, PA-C  verapamil (CALAN-SR) 240 MG CR tablet TAKE ONE TABLET BY MOUTH ONE TIME DAILY 04/17/15   Marletta Lor, MD   Meds Ordered and Administered this Visit   Medications  methylPREDNISolone sodium succinate (SOLU-MEDROL) 125 mg/2 mL injection 80 mg (80 mg Intramuscular Given 09/17/15 1148)    BP 152/78 mmHg  Pulse 77  Temp(Src) 98.5 F (36.9 C) (Oral)  Ht 5\' 8"  (1.727 m)  Wt 227 lb (102.967 kg)  BMI 34.52 kg/m2  SpO2 96%  LMP 05/30/2012 No data found.   Physical Exam  Constitutional: She appears well-developed and well-nourished. No distress.  HENT:  Head: Normocephalic and atraumatic.  Right Ear: Hearing, tympanic membrane, external ear and ear canal normal.  Left Ear: Hearing, tympanic membrane, external ear and ear canal normal.  Nose: Mucosal edema present. Right sinus exhibits no maxillary sinus tenderness and no frontal sinus tenderness. Left sinus exhibits no maxillary sinus tenderness and no frontal sinus tenderness.  Mouth/Throat: Uvula is midline and mucous membranes are normal. Posterior oropharyngeal erythema present. No oropharyngeal exudate, posterior oropharyngeal edema or tonsillar abscesses.  Eyes: Conjunctivae are normal. No scleral icterus.  Neck: Normal range of motion. Neck supple.  Hoarse voice, no stridor  Cardiovascular: Normal rate, regular rhythm and normal heart sounds.   Pulmonary/Chest: Effort normal. No stridor. No respiratory distress. She has wheezes. She has no rales. She exhibits no tenderness.  Faint expiratory wheeze in upper lung fields. No rhonchi. No respiratory distress.  Able to speak in full sentences w/o difficulty.   Abdominal: Soft. Bowel sounds are normal. She exhibits no distension and no mass. There is no tenderness. There is no rebound and no guarding.  Musculoskeletal: Normal range of motion.  Neurological: She is  alert.  Skin: Skin is warm and dry. She is not diaphoretic.  Nursing note and vitals reviewed.   ED Course  Procedures (including critical care time)  Labs Review Labs Reviewed - No data to display  Imaging Review No results found.   MDM   1. Laryngitis   2. Cough   3. Acute upper respiratory infection    Pt c/u URI symptoms and loss of hoarse voice c/w laryngitis.  No evidence of peritonsillar abscess.  Symptoms likely viral in nature.   Tx in UC: solumedrol 80mg  IM Rx: Prednisone, Tessalon, and Albuterol.  Encouraged pt to use Flonase and Mucinex at home.  Rx: Amoxicillin prescribed, encouraged pt to try symptomatic treatment for 5-7 days, start antibiotic if symptoms worsening or not improving.   F/u with PCP in 1 week  as needed. F/u with Dr. Benjamine Mola if episodes of laryngitis keep recurring.  Patient verbalized understanding and agreement with treatment plan.     Noland Fordyce, PA-C 09/17/15 1342

## 2015-09-26 ENCOUNTER — Other Ambulatory Visit: Payer: Self-pay

## 2015-09-26 ENCOUNTER — Telehealth: Payer: Self-pay | Admitting: Internal Medicine

## 2015-09-26 MED ORDER — AMPHETAMINE-DEXTROAMPHETAMINE 20 MG PO TABS: 20.0000 mg | ORAL_TABLET | Freq: Two times a day (BID) | ORAL | Status: DC

## 2015-09-26 MED ORDER — ZOLPIDEM TARTRATE 10 MG PO TABS: 10.0000 mg | ORAL_TABLET | Freq: Every evening | ORAL | Status: DC | PRN

## 2015-09-26 NOTE — Telephone Encounter (Signed)
Pt request refill amphetamine-dextroamphetamine (ADDERALL) 20 MG tablet  Pt aware dr Raliegh Ip is out of the office and may only get a 30 day. Pt verbalized understanding is ok with that.

## 2015-09-26 NOTE — Telephone Encounter (Signed)
Pt notified Rx ready for pickup. Rx printed and signed by Dr. Todd.  

## 2015-10-16 ENCOUNTER — Other Ambulatory Visit: Payer: Self-pay | Admitting: Podiatrist

## 2015-10-19 ENCOUNTER — Other Ambulatory Visit: Payer: Self-pay | Admitting: Podiatrist

## 2015-10-20 ENCOUNTER — Telehealth: Payer: Self-pay | Admitting: *Deleted

## 2015-10-20 MED ORDER — EFINACONAZOLE 10 % EX SOLN: 1.0000 [drp] | Freq: Every day | CUTANEOUS | Status: DC

## 2015-10-20 NOTE — Telephone Encounter (Signed)
Pt states she was prescribed Jublia by Dr. Valentina Lucks, 11/29/2014 and has no more refills.  I told pt I would refill for 1 year, but if the insurance company required a Prior Authorization, I would need her to make an appt to have fungal cultures sent.  Pt states she has a card for Jublia through [facility] that is 0 copay.

## 2015-10-23 ENCOUNTER — Other Ambulatory Visit: Payer: Self-pay | Admitting: Internal Medicine

## 2015-12-26 ENCOUNTER — Other Ambulatory Visit: Payer: Self-pay | Admitting: Internal Medicine

## 2016-01-09 ENCOUNTER — Ambulatory Visit: Payer: 59 | Admitting: Internal Medicine

## 2016-02-02 ENCOUNTER — Encounter: Payer: Self-pay | Admitting: *Deleted

## 2016-02-02 ENCOUNTER — Emergency Department (INDEPENDENT_AMBULATORY_CARE_PROVIDER_SITE_OTHER)
Admission: EM | Admit: 2016-02-02 | Discharge: 2016-02-02 | Disposition: A | Discharge status: Home / Self Care | Payer: 59 | Source: Home / Self Care | Attending: Family Medicine | Admitting: Family Medicine

## 2016-02-02 ENCOUNTER — Telehealth: Payer: Self-pay | Admitting: Internal Medicine

## 2016-02-02 DIAGNOSIS — R6889 Other general symptoms and signs: Secondary | ICD-10-CM | POA: Diagnosis not present

## 2016-02-02 LAB — POCT INFLUENZA A/B
Influenza A, POC: NEGATIVE
Influenza B, POC: NEGATIVE

## 2016-02-02 MED ORDER — OSELTAMIVIR PHOSPHATE 75 MG PO CAPS: 75.0000 mg | ORAL_CAPSULE | Freq: Two times a day (BID) | ORAL | Status: DC

## 2016-02-02 NOTE — ED Notes (Signed)
Pt c/o 3 days of fever, aches, HA, and cough. Taken Tylenol2pm and Motrin 12pm. Received flu vaccine this season.

## 2016-02-02 NOTE — Telephone Encounter (Signed)
[facility] - Client [facility] Patient Name: Misty Benson DOB: 1961/09/01 Initial Comment Caller states she thinks she may have the flu. She is having congestion, cough and aches. She has a fever of 101.3. Nurse Assessment Nurse: Markus Daft, RN, [name] Date/Time Eilene Ghazi Time): 02/02/2016 12:57:09 PM Confirm and document reason for call. If symptomatic, describe symptoms. You must click the next button to save text entered. ---Caller states she is having nasal and chest congestion, productive cough, mild headache, and body aches. She has a fever of 101.3 orally this morning. S/S started Tuesday with dry cough, and has progressed. Fever started last night with the body aches. Has the patient traveled out of the country within the last 30 days? ---No Does the patient have any new or worsening symptoms? ---Yes Will a triage be completed? ---Yes Related visit to physician within the last 2 weeks? ---No Does the PT have any chronic conditions? (i.e. diabetes, asthma, etc.) ---Yes List chronic conditions. ---HTN Is this a behavioral health or substance abuse call? ---No Guidelines Guideline Title Affirmed Question Affirmed Notes Influenza - Seasonal [1] Using nasal washes and pain medicine > 24 hours AND [2] sinus pain (around cheekbone or eye) persists Final Disposition User See Physician within [place], [place], [place] states that she wants to at least have the lab test to r/o influenza and then needs Tamiflu if positive for her and the other members of family. RN advised that Tamiflu would only be helpful if prescribed within 48 hours and if high risk. Caller verbalized understanding. Her son has had asthma in the past. --No available appts and so will go to UC later today. -- She did have the flu shot and so did other family members, son had cold s/s before her s/s started. Referrals REFERRED  TO PCP OFFICE Disagree/Comply: Comply

## 2016-02-02 NOTE — Discharge Instructions (Signed)

## 2016-02-02 NOTE — ED Provider Notes (Signed)
CSN: [id number]     Arrival date & time 02/02/16  1512 History   First MD Initiated Contact with Patient 02/02/16 1543     Chief Complaint  Patient presents with  . Fever  . Generalized Body Aches  . Cough   (Consider location/radiation/quality/duration/timing/severity/associated sxs/prior Treatment) HPI  The pt is a 55yo female presenting to [facility] with c/o flu-like symptoms suddenly worsening over the last 3 days.  Pt is c/o generalized headache, mild to moderately intermittent non-productive cough, nasal congestion, and body aches.  She also report fever, Tmax 101.5*F this morning.  She has been alternating Tylenol and Motrin with helps with the fever and pain. She did receive flu vaccine this season but her daughter is headed back to college this weekend and pt wants to be checked for flu to make sure her children don't get it.  Reports mild nausea but denies vomiting or diarrhea. Her sone has had mild nasal congestion and cough for a few days but no other sick contacts or recent travel. Denies chest pain or SOB.   Past Medical History  Diagnosis Date  . ADHD (attention deficit hyperactivity disorder)   . Obesity   . Hypertension   . Cancer ([place])     BASAL CELL FACE AND SHOULDERS , SQUAMOS CELL ON HAND AND MELANOMA LEFT ARM.Marland Kitchen.  . Cervix cancer ([place]) 1987    microinvasive  . Leiomyoma 2013    multiple small  . Anxiety   . Melanoma [facility])    Past Surgical History  Procedure Laterality Date  . Tonsillectomy      as a child  . Bunionectomy      LEFT FOOT WITH PINS  . Cesarean section    . Dilation and curettage of uterus      x2  . Cervical cone biopsy  1987  . Arm surgery      to remove melanoma on left upper outter arm  . Foot surgery    . Melanoma excision     Family History  Problem Relation Age of Onset  . Stroke Mother   . Osteoporosis Mother   . Hyperlipidemia Father   . Atrial fibrillation Father   . Breast cancer Maternal Grandmother 91   Social History   Substance Use Topics  . Smoking status: Never Smoker   . Smokeless tobacco: Never Used  . Alcohol Use: 2.4 oz/week    4 Standard drinks or equivalent per week     Comment: WINE    OB History    Gravida Para Term Preterm AB TAB SAB Ectopic Multiple Living   7 3   4  4   3      Review of Systems  Constitutional: Positive for fever and chills.  HENT: Positive for congestion and voice change ( hoarse). Negative for ear pain, sore throat and trouble swallowing.   Respiratory: Positive for cough. Negative for shortness of breath.   Cardiovascular: Negative for chest pain and palpitations.  Gastrointestinal: Positive for nausea. Negative for vomiting, abdominal pain and diarrhea.  Musculoskeletal: Positive for myalgias and arthralgias. Negative for back pain.  Skin: Negative for rash.  Neurological: Positive for dizziness and headaches. Negative for light-headedness.    Allergies  Review of patient's allergies indicates no known allergies.  Home Medications   Prior to Admission medications   Medication Sig Start Date End Date Taking? Authorizing Provider  amphetamine-dextroamphetamine (ADDERALL) 20 MG tablet Take 1 tablet (20 mg total) by mouth 2 (two) times daily. 09/26/15  Dorena Cookey, MD  amphetamine-dextroamphetamine (ADDERALL) 20 MG tablet Take 1 tablet (20 mg total) by mouth 2 (two) times daily. 09/26/15   Dorena Cookey, MD  citalopram (CELEXA) 10 MG tablet TAKE 1 TABLET BY MOUTH DAILY 12/26/15   Marletta Lor, MD  hydrochlorothiazide (MICROZIDE) 12.5 MG capsule Take 1 capsule (12.5 mg total) by mouth daily. 07/06/15   Marletta Lor, MD  JUBLIA 10 % SOLN APPLY 1 DROP TOPICALLY DAILY 10/20/15   Wallene Huh, DPM  oseltamivir (TAMIFLU) 75 MG capsule Take 1 capsule (75 mg total) by mouth every 12 (twelve) hours. 02/02/16   Noland Fordyce, PA-C  verapamil (CALAN-SR) 240 MG CR tablet TAKE 1 TABLET BY MOUTH DAILY 12/26/15   Marletta Lor, MD   Meds Ordered and  Administered this Visit  Medications - No data to display  BP 105/66 mmHg  Pulse 75  Temp(Src) 98.3 F (36.8 C) (Oral)  Resp 14  Wt 226 lb (102.513 kg)  SpO2 97%  LMP 05/30/2012 No data found.   Physical Exam  Constitutional: She appears well-developed and well-nourished. No distress.  Pt sitting on exam table, appears well, non-toxic NAD  HENT:  Head: Normocephalic and atraumatic.  Right Ear: Tympanic membrane normal.  Left Ear: Tympanic membrane normal.  Nose: Rhinorrhea present.  Mouth/Throat: Uvula is midline, oropharynx is clear and moist and mucous membranes are normal.  Eyes: Conjunctivae are normal. No scleral icterus.  Neck: Normal range of motion. Neck supple.  Cardiovascular: Normal rate, regular rhythm and normal heart sounds.   Pulmonary/Chest: Effort normal and breath sounds normal. No stridor. No respiratory distress. She has no wheezes. She has no rales. She exhibits no tenderness.  Lungs: CTAB. No respiratory distress.  Abdominal: Soft. She exhibits no distension. There is no tenderness.  Musculoskeletal: Normal range of motion.  Lymphadenopathy:    She has no cervical adenopathy.  Neurological: She is alert.  Skin: Skin is warm and dry. She is not diaphoretic.  Nursing note and vitals reviewed.   ED Course  Procedures (including critical care time)  Labs Review Labs Reviewed  POCT INFLUENZA A/B    Imaging Review No results found.    MDM   1. Flu-like symptoms    Pt c/o flu-like symptoms for 3 days. Lungs: CTAB. Afebrile in UC, last dose of Tylenol 2pm and Motrin- 12PM. O2 Sat 97% on RA  Rapid flu- Negative.  Discussed chance of false-negative.  Pt would like to try Tamiflu.  Encouraged continued symptomatic treatment with fluids, rest, acetaminophen and ibuprofen as well.  F/u with PCP in 1 week, sooner if not improving.  Hand prescription provided to pt's 54yo daughter for prophylactic treatment of flu. Discussed risks/benefits of  Tamiflu. F/u with PCP if symptoms develop.  Patient and daughter verbalized understanding and agreement with treatment plan.    Noland Fordyce, PA-C 02/02/16 [phone]

## 2016-02-03 MED ORDER — ALBUTEROL SULFATE HFA 108 (90 BASE) MCG/ACT IN AERS: 1.0000 | INHALATION_SPRAY | Freq: Four times a day (QID) | RESPIRATORY_TRACT | Status: DC | PRN

## 2016-02-03 MED ORDER — PREDNISONE 20 MG PO TABS: ORAL_TABLET | ORAL | Status: DC

## 2016-02-05 NOTE — Telephone Encounter (Signed)
Pt went to Urgent Care.

## 2016-02-06 ENCOUNTER — Ambulatory Visit (INDEPENDENT_AMBULATORY_CARE_PROVIDER_SITE_OTHER): Payer: 59 | Admitting: Internal Medicine

## 2016-02-06 ENCOUNTER — Encounter: Payer: Self-pay | Admitting: Internal Medicine

## 2016-02-06 VITALS — BP 150/88 | HR 84 | Temp 98.8°F | Resp 20 | Ht 68.0 in | Wt 226.0 lb

## 2016-02-06 DIAGNOSIS — J069 Acute upper respiratory infection, unspecified: Secondary | ICD-10-CM

## 2016-02-06 DIAGNOSIS — F988 Other specified behavioral and emotional disorders with onset usually occurring in childhood and adolescence: Secondary | ICD-10-CM

## 2016-02-06 DIAGNOSIS — F909 Attention-deficit hyperactivity disorder, unspecified type: Secondary | ICD-10-CM | POA: Diagnosis not present

## 2016-02-06 DIAGNOSIS — I10 Essential (primary) hypertension: Secondary | ICD-10-CM | POA: Diagnosis not present

## 2016-02-06 MED ORDER — AMPHETAMINE-DEXTROAMPHETAMINE 20 MG PO TABS: 20.0000 mg | ORAL_TABLET | Freq: Two times a day (BID) | ORAL | Status: DC

## 2016-02-06 MED ORDER — CITALOPRAM HYDROBROMIDE 20 MG PO TABS: 20.0000 mg | ORAL_TABLET | Freq: Every day | ORAL | Status: DC

## 2016-02-06 MED ORDER — HYDROCODONE-HOMATROPINE 5-1.5 MG/5ML PO SYRP: 5.0000 mL | ORAL_SOLUTION | Freq: Four times a day (QID) | ORAL | Status: DC | PRN

## 2016-02-06 NOTE — Progress Notes (Signed)
Subjective:    Patient ID: Misty Benson, female    DOB: 09/08/1961, 55 y.o.   MRN: [id number]  HPI  55 year old patient who is seen today in follow-up.  She was seen at the urgent care 4 days ago with a flulike illness.  She continues to have cough which is improving.  She was treated with Tamiflu.  She has a history of essential hypertension and ADHD, which has been stable.  No recent fever.  Past Medical History  Diagnosis Date  . ADHD (attention deficit hyperactivity disorder)   . Obesity   . Hypertension   . Cancer ([place])     BASAL CELL FACE AND SHOULDERS , SQUAMOS CELL ON HAND AND MELANOMA LEFT ARM.Marland Kitchen.  . Cervix cancer ([place]) 1987    microinvasive  . Leiomyoma 2013    multiple small  . Anxiety   . Melanoma [facility])     Social History   Social History  . Marital Status: Married    Spouse Name: N/A  . Number of Children: N/A  . Years of Education: N/A   Occupational History  . Not on file.   Social History Main Topics  . Smoking status: Never Smoker   . Smokeless tobacco: Never Used  . Alcohol Use: 2.4 oz/week    4 Standard drinks or equivalent per week     Comment: WINE   . Drug Use: No  . Sexual Activity:    Partners: Male    Birth Control/ Protection: Surgical     Comment: PARTNER WITH VASECTOMY-1st intercourse 19 yo-5 partners   Other Topics Concern  . Not on file   Social History Narrative    Past Surgical History  Procedure Laterality Date  . Tonsillectomy      as a child  . Bunionectomy      LEFT FOOT WITH PINS  . Cesarean section    . Dilation and curettage of uterus      x2  . Cervical cone biopsy  1987  . Arm surgery      to remove melanoma on left upper outter arm  . Foot surgery    . Melanoma excision      Family History  Problem Relation Age of Onset  . Stroke Mother   . Osteoporosis Mother   . Hyperlipidemia Father   . Atrial fibrillation Father   . Breast cancer Maternal Grandmother 24    No Known Allergies  Current  Outpatient Prescriptions on File Prior to Visit  Medication Sig Dispense Refill  . albuterol (PROVENTIL HFA;VENTOLIN HFA) 108 (90 Base) MCG/ACT inhaler Inhale 1-2 puffs into the lungs every 6 (six) hours as needed for wheezing or shortness of breath. 1 Inhaler 0  . hydrochlorothiazide (MICROZIDE) 12.5 MG capsule Take 1 capsule (12.5 mg total) by mouth daily. 90 capsule 1  . JUBLIA 10 % SOLN APPLY 1 DROP TOPICALLY DAILY 8 mL 11  . predniSONE (DELTASONE) 20 MG tablet 3 tabs po day one, then 2 po daily x 4 days 11 tablet 0  . verapamil (CALAN-SR) 240 MG CR tablet TAKE 1 TABLET BY MOUTH DAILY 60 tablet 5   No current facility-administered medications on file prior to visit.    BP 150/88 mmHg  Pulse 84  Temp(Src) 98.8 F (37.1 C) (Oral)  Resp 20  Ht 5\' 8"  (1.727 m)  Wt 226 lb (102.513 kg)  BMI 34.37 kg/m2  SpO2 98%  LMP 05/30/2012     Review of Systems  Constitutional: Positive for  fatigue.  HENT: Negative for congestion, dental problem, hearing loss, rhinorrhea, sinus pressure, sore throat and tinnitus.   Eyes: Negative for pain, discharge and visual disturbance.  Respiratory: Positive for cough. Negative for shortness of breath.   Cardiovascular: Negative for chest pain, palpitations and leg swelling.  Gastrointestinal: Negative for nausea, vomiting, abdominal pain, diarrhea, constipation, blood in stool and abdominal distention.  Genitourinary: Negative for dysuria, urgency, frequency, hematuria, flank pain, vaginal bleeding, vaginal discharge, difficulty urinating, vaginal pain and pelvic pain.  Musculoskeletal: Negative for joint swelling, arthralgias and gait problem.  Skin: Negative for rash.  Neurological: Negative for dizziness, syncope, speech difficulty, weakness, numbness and headaches.  Hematological: Negative for adenopathy.  Psychiatric/Behavioral: Negative for behavioral problems, dysphoric mood and agitation. The patient is not nervous/anxious.        Objective:     Physical Exam  Constitutional: She is oriented to person, place, and time. She appears well-developed and well-nourished.  Blood pressure 140/80  HENT:  Head: Normocephalic.  Right Ear: External ear normal.  Left Ear: External ear normal.  Mouth/Throat: Oropharynx is clear and moist.  Eyes: Conjunctivae and EOM are normal. Pupils are equal, round, and reactive to light.  Neck: Normal range of motion. Neck supple. No thyromegaly present.  Cardiovascular: Normal rate, regular rhythm, normal heart sounds and intact distal pulses.   Pulmonary/Chest: Effort normal and breath sounds normal. No respiratory distress. She has no wheezes. She has no rales.  Abdominal: Soft. Bowel sounds are normal. She exhibits no mass. There is no tenderness.  Musculoskeletal: Normal range of motion.  Lymphadenopathy:    She has no cervical adenopathy.  Neurological: She is alert and oriented to person, place, and time.  Skin: Skin is warm and dry. No rash noted.  Psychiatric: She has a normal mood and affect. Her behavior is normal.          Assessment & Plan:   Resolving flu syndrome.  Will continue symptomatic treatment Essential hypertension, stable.  No change in therapy  ADHD.  Medications refilled

## 2016-02-06 NOTE — Patient Instructions (Addendum)
Acute bronchitis symptoms for less than 10 days are generally not helped by antibiotics.  Take over-the-counter expectorants and cough medications such as  Mucinex DM.  Call if there is no improvement in 5 to 7 days or if  you develop worsening cough, fever, or new symptoms, such as shortness of breath or chest pain.  Please check your blood pressure on a regular basis.  If it is consistently greater than 150/90, please make an office appointment.  Limit your sodium (Salt) intake

## 2016-02-06 NOTE — Progress Notes (Signed)
Pre visit review using our clinic review tool, if applicable. No additional management support is needed unless otherwise documented below in the visit note. 

## 2016-02-15 ENCOUNTER — Other Ambulatory Visit: Payer: Self-pay | Admitting: Internal Medicine

## 2016-02-24 ENCOUNTER — Emergency Department (HOSPITAL_COMMUNITY)
Admission: EM | Admit: 2016-02-24 | Discharge: 2016-02-24 | Disposition: A | Discharge status: Home / Self Care | Payer: 59 | Attending: Emergency Medicine | Admitting: Emergency Medicine

## 2016-02-24 ENCOUNTER — Telehealth: Payer: Self-pay | Admitting: Internal Medicine

## 2016-02-24 DIAGNOSIS — F419 Anxiety disorder, unspecified: Secondary | ICD-10-CM | POA: Insufficient documentation

## 2016-02-24 DIAGNOSIS — Z85828 Personal history of other malignant neoplasm of skin: Secondary | ICD-10-CM | POA: Insufficient documentation

## 2016-02-24 DIAGNOSIS — Z79899 Other long term (current) drug therapy: Secondary | ICD-10-CM | POA: Diagnosis not present

## 2016-02-24 DIAGNOSIS — R197 Diarrhea, unspecified: Secondary | ICD-10-CM | POA: Diagnosis not present

## 2016-02-24 DIAGNOSIS — Z85820 Personal history of malignant melanoma of skin: Secondary | ICD-10-CM | POA: Diagnosis not present

## 2016-02-24 DIAGNOSIS — E669 Obesity, unspecified: Secondary | ICD-10-CM | POA: Diagnosis not present

## 2016-02-24 DIAGNOSIS — Z8541 Personal history of malignant neoplasm of cervix uteri: Secondary | ICD-10-CM | POA: Diagnosis not present

## 2016-02-24 DIAGNOSIS — F909 Attention-deficit hyperactivity disorder, unspecified type: Secondary | ICD-10-CM | POA: Diagnosis not present

## 2016-02-24 DIAGNOSIS — I10 Essential (primary) hypertension: Secondary | ICD-10-CM | POA: Diagnosis not present

## 2016-02-24 DIAGNOSIS — R1110 Vomiting, unspecified: Secondary | ICD-10-CM | POA: Insufficient documentation

## 2016-02-24 DIAGNOSIS — Z86018 Personal history of other benign neoplasm: Secondary | ICD-10-CM | POA: Diagnosis not present

## 2016-02-24 LAB — COMPREHENSIVE METABOLIC PANEL
ALBUMIN: 4.3 g/dL (ref 3.5–5.0)
ALK PHOS: 63 U/L (ref 38–126)
ALT: 22 U/L (ref 14–54)
AST: 22 U/L (ref 15–41)
Anion gap: 12 (ref 5–15)
BILIRUBIN TOTAL: 0.9 mg/dL (ref 0.3–1.2)
BUN: 23 mg/dL — AB (ref 6–20)
CALCIUM: 9.7 mg/dL (ref 8.9–10.3)
CO2: 24 mmol/L (ref 22–32)
Chloride: 107 mmol/L (ref 101–111)
Creatinine, Ser: 0.76 mg/dL (ref 0.44–1.00)
GFR calc Af Amer: >60 mL/min (ref >60)
GFR calc non Af Amer: >60 mL/min (ref >60)
GLUCOSE: 123 mg/dL — AB (ref 65–99)
Potassium: 4.5 mmol/L (ref 3.5–5.1)
Sodium: 143 mmol/L (ref 135–145)
TOTAL PROTEIN: 7.8 g/dL (ref 6.5–8.1)

## 2016-02-24 LAB — CBC
HEMATOCRIT: 44.2 % (ref 36.0–46.0)
Hemoglobin: 15.1 g/dL — ABNORMAL HIGH (ref 12.0–15.0)
MCH: 30.3 pg (ref 26.0–34.0)
MCHC: 34.2 g/dL (ref 30.0–36.0)
MCV: 88.6 fL (ref 78.0–100.0)
Platelets: 282 10*3/uL (ref 150–400)
RBC: 4.99 MIL/uL (ref 3.87–5.11)
RDW: 12.7 % (ref 11.5–15.5)
WBC: 12.1 10*3/uL — ABNORMAL HIGH (ref 4.0–10.5)

## 2016-02-24 LAB — URINALYSIS, ROUTINE W REFLEX MICROSCOPIC
BILIRUBIN URINE: NEGATIVE
Glucose, UA: NEGATIVE mg/dL
HGB URINE DIPSTICK: NEGATIVE
KETONES UR: NEGATIVE mg/dL
Leukocytes, UA: NEGATIVE
NITRITE: NEGATIVE
PH: 5 (ref 5.0–8.0)
Protein, ur: NEGATIVE mg/dL
Specific Gravity, Urine: 1.021 (ref 1.005–1.030)

## 2016-02-24 LAB — LIPASE, BLOOD: Lipase: 16 U/L (ref 11–51)

## 2016-02-24 MED ORDER — SODIUM CHLORIDE 0.9 % IV BOLUS (SEPSIS)
1000.0000 mL | Freq: Once | INTRAVENOUS | Status: AC
  Administered 2016-02-24: 1000 mL via INTRAVENOUS

## 2016-02-24 MED ORDER — ONDANSETRON HCL 4 MG/2ML IJ SOLN: 4.0000 mg | Freq: Once | INTRAMUSCULAR | Status: DC

## 2016-02-24 MED ORDER — ONDANSETRON 4 MG PO TBDP: 4.0000 mg | ORAL_TABLET | Freq: Three times a day (TID) | ORAL | Status: DC | PRN

## 2016-02-24 MED ORDER — ONDANSETRON HCL 4 MG/2ML IJ SOLN
4.0000 mg | Freq: Once | INTRAMUSCULAR | Status: AC
  Administered 2016-02-24: 4 mg via INTRAVENOUS
  Filled 2016-02-24: qty 2

## 2016-02-24 MED ORDER — PROMETHAZINE HCL 25 MG RE SUPP: 25.0000 mg | Freq: Four times a day (QID) | RECTAL | Status: DC | PRN

## 2016-02-24 MED ORDER — PROMETHAZINE HCL 25 MG PO TABS
25.0000 mg | ORAL_TABLET | Freq: Once | ORAL | Status: AC
  Administered 2016-02-24: 25 mg via ORAL
  Filled 2016-02-24: qty 1

## 2016-02-24 NOTE — ED Notes (Signed)
Bed: [id number] Expected date:  Expected time:  Means of arrival:  Comments: Hold for triage 1

## 2016-02-24 NOTE — ED Provider Notes (Signed)
CSN: [id number]     Arrival date & time 02/24/16  1109 History   First MD Initiated Contact with Patient 02/24/16 1157     Chief Complaint  Patient presents with  . vomiting/diarrhea      (Consider location/radiation/quality/duration/timing/severity/associated sxs/prior Treatment) HPI  Pt presenting with c/o vomiting and diarrhea.  Pt states that last night before bed she felt some nausea and rumbling in her stomach, this morning at 7am began to have both vomiting and diarrhea- multiple episodes.  Pt states emesis is nonbloody and nonbilious.  Diarrhea is without blood or mucous.  No abdominal pain.  Pt has no specific sick contacts but states she just returned from a trip to [place].  No fever/chills.  She has not tried to drink liquids this morning.  Denies dysuria.  She has not had any treatment prior to arrival.  There are no other associated systemic symptoms, there are no other alleviating or modifying factors.   Past Medical History  Diagnosis Date  . ADHD (attention deficit hyperactivity disorder)   . Obesity   . Hypertension   . Cancer ([place])     BASAL CELL FACE AND SHOULDERS , SQUAMOS CELL ON HAND AND MELANOMA LEFT ARM.Marland Kitchen.  . Cervix cancer ([place]) 1987    microinvasive  . Leiomyoma 2013    multiple small  . Anxiety   . Melanoma [facility])    Past Surgical History  Procedure Laterality Date  . Tonsillectomy      as a child  . Bunionectomy      LEFT FOOT WITH PINS  . Cesarean section    . Dilation and curettage of uterus      x2  . Cervical cone biopsy  1987  . Arm surgery      to remove melanoma on left upper outter arm  . Foot surgery    . Melanoma excision     Family History  Problem Relation Age of Onset  . Stroke Mother   . Osteoporosis Mother   . Hyperlipidemia Father   . Atrial fibrillation Father   . Breast cancer Maternal Grandmother 91   Social History  Substance Use Topics  . Smoking status: Never Smoker   . Smokeless tobacco: Never Used  . Alcohol  Use: 2.4 oz/week    4 Standard drinks or equivalent per week     Comment: WINE    OB History    Gravida Para Term Preterm AB TAB SAB Ectopic Multiple Living   7 3   4  4   3      Review of Systems  ROS reviewed and all otherwise negative except for mentioned in HPI    Allergies  Review of patient's allergies indicates no known allergies.  Home Medications   Prior to Admission medications   Medication Sig Start Date End Date Taking? Authorizing Provider  acetaminophen (TYLENOL) 500 MG tablet Take 1,000 mg by mouth every 6 (six) hours as needed for mild pain, moderate pain, fever or headache.   Yes Historical Provider, MD  albuterol (PROVENTIL HFA;VENTOLIN HFA) 108 (90 Base) MCG/ACT inhaler Inhale 1-2 puffs into the lungs every 6 (six) hours as needed for wheezing or shortness of breath. 02/03/16  Yes Noland Fordyce, PA-C  amphetamine-dextroamphetamine (ADDERALL) 20 MG tablet Take 1 tablet (20 mg total) by mouth 2 (two) times daily. 02/06/16  Yes Marletta Lor, MD  citalopram (CELEXA) 20 MG tablet Take 1 tablet (20 mg total) by mouth daily. 02/06/16  Yes Marletta Lor,  MD  hydrochlorothiazide (MICROZIDE) 12.5 MG capsule TAKE 1 CAPSULE BY MOUTH DAILY 02/15/16  Yes Marletta Lor, MD  ibuprofen (ADVIL,MOTRIN) 200 MG tablet Take 600-800 mg by mouth every 6 (six) hours as needed for fever, headache, mild pain, moderate pain or cramping.   Yes Historical Provider, MD  JUBLIA 10 % SOLN APPLY 1 DROP TOPICALLY DAILY 10/20/15  Yes Tamala Fothergill Regal, DPM  verapamil (CALAN-SR) 240 MG CR tablet TAKE 1 TABLET BY MOUTH DAILY 12/26/15  Yes Marletta Lor, MD  zolpidem (AMBIEN) 10 MG tablet Take 5-10 mg by mouth at bedtime as needed for sleep.  01/29/16  Yes Historical Provider, MD  amphetamine-dextroamphetamine (ADDERALL) 20 MG tablet Take 1 tablet (20 mg total) by mouth 2 (two) times daily. Patient not taking: Reported on 02/24/2016 02/06/16   Marletta Lor, MD   amphetamine-dextroamphetamine (ADDERALL) 20 MG tablet Take 1 tablet (20 mg total) by mouth 2 (two) times daily. Patient not taking: Reported on 02/24/2016 02/06/16   Marletta Lor, MD  HYDROcodone-homatropine [facility]) 5-1.5 MG/5ML syrup Take 5 mLs by mouth every 6 (six) hours as needed for cough. Patient not taking: Reported on 02/24/2016 02/06/16   Marletta Lor, MD  ondansetron (ZOFRAN ODT) 4 MG disintegrating tablet Take 1 tablet (4 mg total) by mouth every 8 (eight) hours as needed for nausea or vomiting. 02/24/16   Alfonzo Beers, MD  predniSONE (DELTASONE) 20 MG tablet 3 tabs po day one, then 2 po daily x 4 days Patient not taking: Reported on 02/24/2016 02/03/16   Noland Fordyce, PA-C  promethazine (PHENERGAN) 25 MG suppository Place 1 suppository (25 mg total) rectally every 6 (six) hours as needed for nausea or vomiting. 02/24/16   Alfonzo Beers, MD   BP 120/67 mmHg  Pulse 66  Temp(Src) 98.9 F (37.2 C) (Oral)  Resp 16  SpO2 100%  LMP 05/30/2012  Vitals reviewed Physical Exam  Physical Examination: General appearance - alert, well appearing, and in no distress Mental status - alert, oriented to person, place, and time Eyes - no conjunctival injection, no scleral icterus Mouth - mucous membranes moist, pharynx normal without lesions Chest - clear to auscultation, no wheezes, rales or rhonchi, symmetric air entry Heart - normal rate, regular rhythm, normal S1, S2, no murmurs, rubs, clicks or gallops Abdomen - soft, nontender, nondistended, no masses or organomegaly, nabs Neurological - alert, oriented, normal speech Extremities - peripheral pulses normal, no pedal edema, no clubbing or cyanosis Skin - normal coloration and turgor, no rashes Psych- anxious appearing  ED Course  Procedures (including critical care time) Labs Review Labs Reviewed  COMPREHENSIVE METABOLIC PANEL - Abnormal; Notable for the following:    Glucose, Bld 123 (*)    BUN 23 (*)    All other  components within normal limits  CBC - Abnormal; Notable for the following:    WBC 12.1 (*)    Hemoglobin 15.1 (*)    All other components within normal limits  LIPASE, BLOOD  URINALYSIS, ROUTINE W REFLEX MICROSCOPIC (NOT AT [facility])    Imaging Review No results found. I have personally reviewed and evaluated these images and lab results as part of my medical decision-making.   EKG Interpretation None      MDM   Final diagnoses:  Vomiting and diarrhea    Pt presenting with acute onset of vomiting and diarrhea,  Abdominal exam is benign. Pt initially hypotensive but this resolved quickly with IV fluids.  Pt also treated with antiemtics, she  has been able to tolerate a fluid challenge. Doubt SBO, pyelo, cholecystitis or other acute emergent process at this time.    Discharged with strict return precautions.  Pt agreeable with plan.    Alfonzo Beers, MD 02/25/16 [phone]

## 2016-02-24 NOTE — ED Notes (Signed)
MD at bedside. 

## 2016-02-24 NOTE — ED Notes (Signed)
UNABLE TO COLLECT LABS AT THIS TIME PATIENT IS USING THE RESTROOM

## 2016-02-24 NOTE — ED Notes (Signed)
Per pt, states vomiting and diarrhea since 7 am-abdominal pain-states has been out of town-possible noravirus and/or food poisoning

## 2016-02-24 NOTE — Discharge Instructions (Signed)
Return to the ED with any concerns including vomiting and not able to keep down liquids or your medications, abdominal pain especially if it localizes to the right lower abdomen, fever or chills, and decreased urine output, decreased level of alertness or lethargy, or any other alarming symptoms.  °

## 2016-02-26 NOTE — Telephone Encounter (Signed)
FYI, pt went to ED on 4/15.

## 2016-02-26 NOTE — Telephone Encounter (Signed)
[facility] Night - Client TELEPHONE [facility] Patient Name: Misty Benson Gender: Female DOB: [birth year] Age: 55 Y 38 M 25 D Return Phone Number: [phone] (Primary), [phone] (Secondary) Address: City/State/Zip:  Client [facility] Night - Client Client Site [facility] - Night Physician Simonne Martinet Contact Type Call Who Is Calling Patient / Member / Family / Caregiver Call Type Triage / Clinical Caller Name Antonae Relationship To Patient Self Return Phone Number [phone] (Primary) Chief Complaint ABDOMINAL PAIN - Severe and only in abdomen Reason for Call Symptomatic / Request for Misty Benson states she is sick with vomiting , diarrhea. It is painful in her stomach and chest. PreDisposition Did not know what to do Translation No Nurse Assessment Nurse: Sherral Hammers, RN, Megan Date/Time (Eastern Time): 02/24/2016 10:12:05 AM Confirm and document reason for call. If symptomatic, describe symptoms. You must click the next button to save text entered. ---Caller states she is sick with vomiting , diarrhea. It is painful in her upper stomach. Denies chest pain. Pain 10/10 Has the patient traveled out of the country within the last 30 days? ---No Does the patient have any new or worsening symptoms? ---Yes Will a triage be completed? ---Yes Related visit to physician within the last 2 weeks? ---No Does the PT have any chronic conditions? (i.e. diabetes, asthma, etc.) ---Yes List chronic conditions. ---htn Is this a behavioral health or substance abuse call? ---No Guidelines Guideline Title Affirmed Question Affirmed Notes Nurse Date/Time (Eastern Time) Abdominal Pain - Female [1] SEVERE pain (e.g., excruciating) AND [2] present > 1 hour Sherral Hammers, RN, Jinny Blossom 02/24/2016 10:14:11 AM Disp. Time Eilene Ghazi Time) Disposition Final User 02/24/2016  10:10:57 AM Send to Urgent Josefine Class, Miracle 02/24/2016 10:18:14 AM Go to ED Now Yes Sherral Hammers, RN, Megan PLEASE NOTE: All timestamps contained within this report are represented as [place] Standard Time. CONFIDENTIALTY NOTICE: This fax transmission is intended only for the addressee. It contains information that is legally privileged, confidential or otherwise protected from use or disclosure. If you are not the intended recipient, you are strictly prohibited from reviewing, disclosing, copying using or disseminating any of this information or taking any action in reliance on or regarding this information. If you have received this fax in error, please notify us immediately by telephone so that we can arrange for its return to [place]. Phone: [phone], Toll-Free: [phone], Fax: [phone] Page: 2 of 2 Call Id: [id number] Caller Understands: Yes Disagree/Comply: Comply Care Advice Given Per Guideline GO TO ED NOW: You need to be seen in the Emergency Department. Go to the ER at ___________ [place] now. Drive carefully. DRIVING: Another adult should drive. BRING MEDICINES: * Please bring a list of your current medicines when you go to the Emergency Department (ER). NOTHING BY MOUTH: Do not eat or drink anything for now. (Reason: condition may need surgery and general anesthesia.) Referrals [facility] - ED

## 2016-03-04 ENCOUNTER — Other Ambulatory Visit: Payer: Self-pay

## 2016-03-04 DIAGNOSIS — Z1231 Encounter for screening mammogram for malignant neoplasm of breast: Secondary | ICD-10-CM

## 2016-03-27 ENCOUNTER — Ambulatory Visit
Admission: RE | Admit: 2016-03-27 | Discharge: 2016-03-27 | Disposition: A | Discharge status: Home / Self Care | Payer: 59 | Source: Ambulatory Visit

## 2016-03-27 DIAGNOSIS — Z1231 Encounter for screening mammogram for malignant neoplasm of breast: Secondary | ICD-10-CM

## 2016-04-24 ENCOUNTER — Encounter: Payer: Self-pay | Admitting: Gynecology

## 2016-04-24 ENCOUNTER — Ambulatory Visit (INDEPENDENT_AMBULATORY_CARE_PROVIDER_SITE_OTHER): Payer: 59 | Admitting: Gynecology

## 2016-04-24 VITALS — BP 136/84 | Ht 69.0 in | Wt 229.0 lb

## 2016-04-24 DIAGNOSIS — N816 Rectocele: Secondary | ICD-10-CM | POA: Diagnosis not present

## 2016-04-24 DIAGNOSIS — Z1329 Encounter for screening for other suspected endocrine disorder: Secondary | ICD-10-CM | POA: Diagnosis not present

## 2016-04-24 DIAGNOSIS — Z01419 Encounter for gynecological examination (general) (routine) without abnormal findings: Secondary | ICD-10-CM

## 2016-04-24 DIAGNOSIS — N952 Postmenopausal atrophic vaginitis: Secondary | ICD-10-CM | POA: Diagnosis not present

## 2016-04-24 DIAGNOSIS — Z13220 Encounter for screening for lipoid disorders: Secondary | ICD-10-CM | POA: Diagnosis not present

## 2016-04-24 DIAGNOSIS — Z1321 Encounter for screening for nutritional disorder: Secondary | ICD-10-CM

## 2016-04-24 MED ORDER — ZOLPIDEM TARTRATE 10 MG PO TABS: 5.0000 mg | ORAL_TABLET | Freq: Every evening | ORAL | Status: DC | PRN

## 2016-04-24 NOTE — Patient Instructions (Signed)

## 2016-04-24 NOTE — Addendum Note (Signed)
Addended by: Nelva Nay on: 04/24/2016 12:20 PM   Modules accepted: Orders, SmartSet

## 2016-04-24 NOTE — Progress Notes (Signed)
    Misty Benson [birth year] [id number]        55 y.o.  [id number]  for annual exam.  Doing well.  Past medical history,surgical history, problem list, medications, allergies, family history and social history were all reviewed and documented as reviewed in the EPIC chart.  ROS:  Performed with pertinent positives and negatives included in the history, assessment and plan.   Additional significant findings :  None   Exam: Misty Benson assistant Filed Vitals:   04/24/16 1116  BP: 136/84  Height: 5\' 9"  (1.753 m)  Weight: 229 lb (103.874 kg)   General appearance:  Normal affect, orientation and appearance. Skin: Grossly normal HEENT: Without gross lesions.  No cervical or supraclavicular adenopathy. Thyroid normal.  Lungs:  Clear without wheezing, rales or rhonchi Cardiac: RR, without RMG Abdominal:  Soft, nontender, without masses, guarding, rebound, organomegaly or hernia Breasts:  Examined lying and sitting without masses, retractions, discharge or axillary adenopathy. Pelvic:  Ext/BUS/vagina with atrophic changes. Mild rectocele noted  Cervix with atrophic changes. Pap smear done  Uterus anteverted, normal size, shape and contour, midline and mobile nontender   Adnexa without masses or tenderness    Anus and perineum normal   Rectovaginal normal sphincter tone without palpated masses or tenderness. Mild rectocele noted   Assessment/Plan:  55 y.o. [id number] female for annual exam.   1. Postmenopausal/atrophic genital changes.  Without significant hot flushes, night sweats, vaginal dryness or any vaginal bleeding. Continue monitoring report any issues or bleeding. 2. Mild rectocele. Asymptomatic to the patient. Again discussed options and at this point she elects for expectant management. Will follow up if develops symptoms. 3. Pap smear/HPV 11/2013 negative. Pap smear done today.  History of microinvasive carcinoma of the cervix status post cone biopsy 1987. Negative Pap smears  since. 4. Mammography 03/2016. Continue with annual mammography when due. SBE monthly reviewed. 5. Colonoscopy 2011. Repeat at their recommended interval. 6. DEXA never. Will plan further into the menopause. 7. Insomnia. Patient uses Ambien 5-10 mg intermittently at night to help with sleep as needed. #30 with 2 refills provided. 8. Health maintenance. Mild borderline blood pressure noted at 136/84. Patient's going to start following at home and report any consistent elevations. Baseline CBC, CMP, lipid profile, TSH, urinalysis and vitamin D ordered. Follow up 1 year, sooner as needed.   Anastasio Auerbach MD, 11:56 AM 04/24/2016

## 2016-04-25 ENCOUNTER — Other Ambulatory Visit: Payer: 59

## 2016-04-25 LAB — PAP IG W/ RFLX HPV ASCU

## 2016-04-25 LAB — URINALYSIS W MICROSCOPIC + REFLEX CULTURE
BILIRUBIN URINE: NEGATIVE
Bacteria, UA: NONE SEEN [HPF]
CASTS: NONE SEEN [LPF]
Crystals: NONE SEEN [HPF]
Glucose, UA: NEGATIVE
Hgb urine dipstick: NEGATIVE
KETONES UR: NEGATIVE
Leukocytes, UA: NEGATIVE
NITRITE: NEGATIVE
PH: 7.5 (ref 5.0–8.0)
Protein, ur: NEGATIVE
RBC / HPF: NONE SEEN RBC/HPF (ref <=2)
SPECIFIC GRAVITY, URINE: 1.014 (ref 1.001–1.035)
Squamous Epithelial / LPF: NONE SEEN [HPF] (ref <=5)
WBC UA: NONE SEEN WBC/HPF (ref <=5)
Yeast: NONE SEEN [HPF]

## 2016-07-29 ENCOUNTER — Encounter: Payer: Self-pay | Admitting: Emergency Medicine

## 2016-07-29 ENCOUNTER — Emergency Department (INDEPENDENT_AMBULATORY_CARE_PROVIDER_SITE_OTHER)
Admission: EM | Admit: 2016-07-29 | Discharge: 2016-07-29 | Disposition: A | Discharge status: Home / Self Care | Payer: 59 | Source: Home / Self Care | Attending: Family Medicine | Admitting: Family Medicine

## 2016-07-29 DIAGNOSIS — S90851A Superficial foreign body, right foot, initial encounter: Secondary | ICD-10-CM

## 2016-07-29 DIAGNOSIS — M795 Residual foreign body in soft tissue: Secondary | ICD-10-CM

## 2016-07-29 NOTE — ED Provider Notes (Signed)
CSN: [id number]     Arrival date & time 07/29/16  1543 History   None    Chief Complaint  Patient presents with  . Foot Pain   (Consider location/radiation/quality/duration/timing/severity/associated sxs/prior Treatment) HPI  Misty Benson is a 55 y.o. female presenting to UC with c/o Right foot pain and concern for glass being stuck in her foot after accidentally stepping on a piece of broken glass in her kitchen last night. Pain is uncomfortable, 3/10.   She reports being able to remove a small triangular piece of glass from her foot yesterday but noticed a small "shiny piece" that looked like glass still in her foot today.  She tried using a sewing needle and reports causing the wound to bleed more but states she is unsure if she got to little piece of glass out.  Denies any other injuries. Tetanus is UTD.     Past Medical History:  Diagnosis Date  . ADHD (attention deficit hyperactivity disorder)   . Anxiety   . Cancer ([place])    BASAL CELL FACE AND SHOULDERS , SQUAMOS CELL ON HAND AND MELANOMA LEFT ARM.Marland Kitchen.  . Cervix cancer ([place]) 1987   microinvasive  . Hypertension   . Leiomyoma 2013   multiple small  . Melanoma ([place])   . Obesity    Past Surgical History:  Procedure Laterality Date  . arm surgery     to remove melanoma on left upper outter arm  . BUNIONECTOMY     LEFT FOOT WITH PINS  . CERVICAL CONE BIOPSY  1987  . CESAREAN SECTION    . DILATION AND CURETTAGE OF UTERUS     x2  . FOOT SURGERY    . MELANOMA EXCISION    . TONSILLECTOMY     as a child   Family History  Problem Relation Age of Onset  . Stroke Mother   . Osteoporosis Mother   . Hyperlipidemia Father   . Atrial fibrillation Father   . Breast cancer Maternal Grandmother 91   Social History  Substance Use Topics  . Smoking status: Never Smoker  . Smokeless tobacco: Never Used  . Alcohol use 2.4 oz/week    4 Standard drinks or equivalent per week     Comment: WINE    OB History    Gravida Para Term  Preterm AB Living   7 3     4 3    SAB TAB Ectopic Multiple Live Births   4       3     Review of Systems  Musculoskeletal: Negative for arthralgias, joint swelling and myalgias.  Skin: Positive for wound. Negative for color change.  Neurological: Negative for weakness and numbness.    Allergies  Review of patient's allergies indicates no known allergies.  Home Medications   Prior to Admission medications   Medication Sig Start Date End Date Taking? Authorizing Provider  acetaminophen (TYLENOL) 500 MG tablet Take 1,000 mg by mouth every 6 (six) hours as needed for mild pain, moderate pain, fever or headache.    Historical Provider, MD  albuterol (PROVENTIL HFA;VENTOLIN HFA) 108 (90 Base) MCG/ACT inhaler Inhale 1-2 puffs into the lungs every 6 (six) hours as needed for wheezing or shortness of breath. 02/03/16   Noland Fordyce, PA-C  amphetamine-dextroamphetamine (ADDERALL) 20 MG tablet Take 1 tablet (20 mg total) by mouth 2 (two) times daily. 02/06/16   Marletta Lor, MD  citalopram (CELEXA) 20 MG tablet Take 1 tablet (20 mg total) by mouth daily.  02/06/16   Marletta Lor, MD  hydrochlorothiazide (MICROZIDE) 12.5 MG capsule TAKE 1 CAPSULE BY MOUTH DAILY 02/15/16   Marletta Lor, MD  ibuprofen (ADVIL,MOTRIN) 200 MG tablet Take 600-800 mg by mouth every 6 (six) hours as needed for fever, headache, mild pain, moderate pain or cramping.    Historical Provider, MD  JUBLIA 10 % SOLN APPLY 1 DROP TOPICALLY DAILY 10/20/15   Wallene Huh, DPM  verapamil (CALAN-SR) 240 MG CR tablet TAKE 1 TABLET BY MOUTH DAILY 12/26/15   Marletta Lor, MD  zolpidem (AMBIEN) 10 MG tablet Take 0.5-1 tablets (5-10 mg total) by mouth at bedtime as needed for sleep. 04/24/16   Anastasio Auerbach, MD   Meds Ordered and Administered this Visit  Medications - No data to display  BP 133/78 (BP Location: Left Arm)   Pulse 70   Temp 98.4 F (36.9 C) (Oral)   Ht 5\' 8"  (1.727 m)   Wt 226 lb (102.5 kg)    LMP 05/30/2012   SpO2 99%   BMI 34.36 kg/m  No data found.   Physical Exam  Constitutional: She is oriented to person, place, and time. She appears well-developed and well-nourished.  HENT:  Head: Normocephalic and atraumatic.  Eyes: EOM are normal.  Neck: Normal range of motion.  Cardiovascular: Normal rate.   Pulmonary/Chest: Effort normal.  Musculoskeletal: Normal range of motion.  Neurological: She is alert and oriented to person, place, and time.  Skin: Skin is warm and dry. Capillary refill takes less than 2 seconds.  Right foot: plantar aspect: 86mm superficial laceration. No foreign body seen or palpated. No active bleeding.  Psychiatric: She has a normal mood and affect. Her behavior is normal.  Nursing note and vitals reviewed.   Urgent Care Course   Clinical Course   Wound on Right foot thoroughly explored under magnified glasses.  Gently explored wound with 18gtt needle.  No foreign body visualized.  Found flushed with 20cc syringe of saline and 2 drops of betadine.  Wound dried. Bacitracin and bandage applied.   Procedures (including critical care time)  Labs Review Labs Reviewed - No data to display  Imaging Review No results found.   MDM   1. Foreign body in right foot, initial encounter   2. Foreign body (FB) in soft tissue    Pt reports getting glass stuck in foot last night but did remove a piece yesterday. Pt unsure if smaller piece came out when she made wound bleed earlier today.  No FB visualized on exam. Discussed getting plain films to help visualize FB. Pt declined stating it was probably too small to be seen on imaging.    Pt tolerated cleaning of wound well. Agreeable to monitor for signs of infection. Will f/u with PCP or return to [facility] as needed.     Noland Fordyce, PA-C 07/29/16 1726

## 2016-07-29 NOTE — ED Triage Notes (Signed)
Rt foot, foreign object, stepped on small piece of glass last night

## 2016-07-30 ENCOUNTER — Other Ambulatory Visit (INDEPENDENT_AMBULATORY_CARE_PROVIDER_SITE_OTHER): Payer: 59

## 2016-07-30 DIAGNOSIS — E559 Vitamin D deficiency, unspecified: Secondary | ICD-10-CM | POA: Diagnosis not present

## 2016-07-30 DIAGNOSIS — Z0000 Encounter for general adult medical examination without abnormal findings: Secondary | ICD-10-CM

## 2016-07-30 LAB — CBC WITH DIFFERENTIAL/PLATELET
BASOS PCT: 0.5 % (ref 0.0–3.0)
Basophils Absolute: 0 10*3/uL (ref 0.0–0.1)
EOS PCT: 2.5 % (ref 0.0–5.0)
Eosinophils Absolute: 0.1 10*3/uL (ref 0.0–0.7)
HCT: 38.8 % (ref 36.0–46.0)
HEMOGLOBIN: 13.2 g/dL (ref 12.0–15.0)
LYMPHS ABS: 1.5 10*3/uL (ref 0.7–4.0)
Lymphocytes Relative: 27.9 % (ref 12.0–46.0)
MCHC: 34.1 g/dL (ref 30.0–36.0)
MCV: 89.5 fl (ref 78.0–100.0)
MONO ABS: 0.4 10*3/uL (ref 0.1–1.0)
MONOS PCT: 8.3 % (ref 3.0–12.0)
NEUTROS PCT: 60.8 % (ref 43.0–77.0)
Neutro Abs: 3.3 10*3/uL (ref 1.4–7.7)
Platelets: 247 10*3/uL (ref 150.0–400.0)
RBC: 4.34 Mil/uL (ref 3.87–5.11)
RDW: 13.1 % (ref 11.5–15.5)
WBC: 5.4 10*3/uL (ref 4.0–10.5)

## 2016-07-30 LAB — HEPATIC FUNCTION PANEL
ALBUMIN: 3.9 g/dL (ref 3.5–5.2)
ALT: 17 U/L (ref 0–35)
AST: 15 U/L (ref 0–37)
Alkaline Phosphatase: 62 U/L (ref 39–117)
BILIRUBIN TOTAL: 0.4 mg/dL (ref 0.2–1.2)
Bilirubin, Direct: 0 mg/dL (ref 0.0–0.3)
Total Protein: 6.7 g/dL (ref 6.0–8.3)

## 2016-07-30 LAB — LIPID PANEL
CHOLESTEROL: 210 mg/dL — AB (ref 0–200)
HDL: 68.3 mg/dL (ref >39.00)
LDL CALC: 123 mg/dL — AB (ref 0–99)
NonHDL: 141.59
TRIGLYCERIDES: 95 mg/dL (ref 0.0–149.0)
Total CHOL/HDL Ratio: 3
VLDL: 19 mg/dL (ref 0.0–40.0)

## 2016-07-30 LAB — BASIC METABOLIC PANEL
BUN: 22 mg/dL (ref 6–23)
CHLORIDE: 106 meq/L (ref 96–112)
CO2: 29 mEq/L (ref 19–32)
Calcium: 9.3 mg/dL (ref 8.4–10.5)
Creatinine, Ser: 0.71 mg/dL (ref 0.40–1.20)
GFR: 90.51 mL/min (ref >60.00)
GLUCOSE: 97 mg/dL (ref 70–99)
POTASSIUM: 4 meq/L (ref 3.5–5.1)
SODIUM: 141 meq/L (ref 135–145)

## 2016-07-30 LAB — TSH: TSH: 1.8 u[IU]/mL (ref 0.35–4.50)

## 2016-07-30 LAB — VITAMIN D 25 HYDROXY (VIT D DEFICIENCY, FRACTURES): VITD: 23.63 ng/mL — AB (ref 30.00–100.00)

## 2016-07-31 ENCOUNTER — Other Ambulatory Visit: Payer: Self-pay | Admitting: *Deleted

## 2016-07-31 MED ORDER — VITAMIN D (ERGOCALCIFEROL) 1.25 MG (50000 UNIT) PO CAPS: 50000.0000 [IU] | ORAL_CAPSULE | ORAL | 0 refills | Status: DC

## 2016-08-06 ENCOUNTER — Ambulatory Visit (INDEPENDENT_AMBULATORY_CARE_PROVIDER_SITE_OTHER): Payer: 59 | Admitting: Internal Medicine

## 2016-08-06 ENCOUNTER — Encounter: Payer: Self-pay | Admitting: Internal Medicine

## 2016-08-06 VITALS — BP 124/80 | HR 78 | Temp 98.8°F | Resp 20 | Ht 67.5 in | Wt 227.5 lb

## 2016-08-06 DIAGNOSIS — Z23 Encounter for immunization: Secondary | ICD-10-CM

## 2016-08-06 DIAGNOSIS — Z0000 Encounter for general adult medical examination without abnormal findings: Secondary | ICD-10-CM | POA: Diagnosis not present

## 2016-08-06 MED ORDER — HYDROCHLOROTHIAZIDE 12.5 MG PO CAPS: 12.5000 mg | ORAL_CAPSULE | Freq: Every day | ORAL | 3 refills | Status: DC

## 2016-08-06 MED ORDER — AMPHETAMINE-DEXTROAMPHETAMINE 20 MG PO TABS: 20.0000 mg | ORAL_TABLET | Freq: Two times a day (BID) | ORAL | 0 refills | Status: DC

## 2016-08-06 MED ORDER — VERAPAMIL HCL ER 240 MG PO TBCR: 240.0000 mg | EXTENDED_RELEASE_TABLET | Freq: Every day | ORAL | 3 refills | Status: DC

## 2016-08-06 NOTE — Progress Notes (Signed)
Pre visit review using our clinic review tool, if applicable. No additional management support is needed unless otherwise documented below in the visit note. 

## 2016-08-06 NOTE — Patient Instructions (Signed)

## 2016-08-06 NOTE — Progress Notes (Signed)
Subjective:    Patient ID: Misty Benson, female    DOB: [birth year], 55 y.o.   MRN: [id number]  HPI  55  year-old patient who is seen today for a preventive health examination.   She has a long history of treated hypertension which has been stable.  She has ADHD, which has been well controlled on Adderall.     Patient is followed by Gynecology and dermatology twice annually.  She has a history of melanoma times 2  Colonoscopy 2011  Family history.  Father age 44 in remarkably good health, possibly diet-controlled dyslipidemia.  Mother age 9.  Both parents in declining health.  Father with recent diagnosis of small cell prostate cancer.  Hypertension.  Osteoporosis.  History of TIA.  One brother with hypertension  Social history.  Married 3 children.  Nonsmoker  Past Medical History:  Diagnosis Date  . ADHD (attention deficit hyperactivity disorder)   . Anxiety   . Cancer ([place])    BASAL CELL FACE AND SHOULDERS , SQUAMOS CELL ON HAND AND MELANOMA LEFT ARM.Marland Kitchen.  . Cervix cancer ([place]) 1987   microinvasive  . Hypertension   . Leiomyoma 2013   multiple small  . Melanoma ([place])   . Obesity     Social History   Social History  . Marital status: Married    Spouse name: N/A  . Number of children: N/A  . Years of education: N/A   Occupational History  . Not on file.   Social History Main Topics  . Smoking status: Never Smoker  . Smokeless tobacco: Never Used  . Alcohol use 2.4 oz/week    4 Standard drinks or equivalent per week     Comment: WINE   . Drug use: No  . Sexual activity: Yes    Partners: Male    Birth control/ protection: Surgical     Comment: VASECTOMY-1st intercourse 52 yo-5 partners   Other Topics Concern  . Not on file   Social History Narrative  . No narrative on file    Past Surgical History:  Procedure Laterality Date  . arm surgery     to remove melanoma on left upper outter arm  . BUNIONECTOMY     LEFT FOOT WITH PINS  . CERVICAL CONE BIOPSY   1987  . CESAREAN SECTION    . DILATION AND CURETTAGE OF UTERUS     x2  . FOOT SURGERY    . MELANOMA EXCISION    . TONSILLECTOMY     as a child    Family History  Problem Relation Age of Onset  . Stroke Mother   . Osteoporosis Mother   . Hyperlipidemia Father   . Atrial fibrillation Father   . Breast cancer Maternal Grandmother 27    No Known Allergies  Current Outpatient Prescriptions on File Prior to Visit  Medication Sig Dispense Refill  . acetaminophen (TYLENOL) 500 MG tablet Take 1,000 mg by mouth every 6 (six) hours as needed for mild pain, moderate pain, fever or headache.    . albuterol (PROVENTIL HFA;VENTOLIN HFA) 108 (90 Base) MCG/ACT inhaler Inhale 1-2 puffs into the lungs every 6 (six) hours as needed for wheezing or shortness of breath. 1 Inhaler 0  . citalopram (CELEXA) 20 MG tablet Take 1 tablet (20 mg total) by mouth daily. 90 tablet 4  . ibuprofen (ADVIL,MOTRIN) 200 MG tablet Take 600-800 mg by mouth every 6 (six) hours as needed for fever, headache, mild pain, moderate pain or cramping.    Marland Kitchen  JUBLIA 10 % SOLN APPLY 1 DROP TOPICALLY DAILY 8 mL 11  . Vitamin D, Ergocalciferol, (DRISDOL) 50000 units CAPS capsule Take 1 capsule (50,000 Units total) by mouth every 7 (seven) days. 8 capsule 0  . zolpidem (AMBIEN) 10 MG tablet Take 0.5-1 tablets (5-10 mg total) by mouth at bedtime as needed for sleep. 30 tablet 2   No current facility-administered medications on file prior to visit.     BP 124/80 (BP Location: Left Arm, Patient Position: Sitting, Cuff Size: Large)   Pulse 78   Temp 98.8 F (37.1 C) (Oral)   Resp 20   Ht 5' 7.5" (1.715 m)   Wt 227 lb 8 oz (103.2 kg)   LMP 05/30/2012   SpO2 97%   BMI 35.11 kg/m       Review of Systems  Constitutional: Negative.   HENT: Negative for congestion, dental problem, hearing loss, rhinorrhea, sinus pressure, sore throat and tinnitus.   Eyes: Negative for pain, discharge and visual disturbance.  Respiratory:  Negative for cough and shortness of breath.   Cardiovascular: Negative for chest pain, palpitations and leg swelling.  Gastrointestinal: Negative for abdominal distention, abdominal pain, blood in stool, constipation, diarrhea, nausea and vomiting.  Genitourinary: Negative for difficulty urinating, dysuria, flank pain, frequency, hematuria, pelvic pain, urgency, vaginal bleeding, vaginal discharge and vaginal pain.  Musculoskeletal: Positive for back pain. Negative for arthralgias, gait problem and joint swelling.  Skin: Negative for rash.  Neurological: Negative for dizziness, syncope, speech difficulty, weakness, numbness and headaches.  Hematological: Negative for adenopathy.  Psychiatric/Behavioral: Negative for agitation, behavioral problems and dysphoric mood. The patient is not nervous/anxious.        Objective:   Physical Exam  Constitutional: She is oriented to person, place, and time. She appears well-developed and well-nourished.  HENT:  Head: Normocephalic.  Right Ear: External ear normal.  Left Ear: External ear normal.  Mouth/Throat: Oropharynx is clear and moist.  Eyes: Conjunctivae and EOM are normal. Pupils are equal, round, and reactive to light.  Neck: Normal range of motion. Neck supple. No thyromegaly present.  Cardiovascular: Normal rate, regular rhythm, normal heart sounds and intact distal pulses.   Pulmonary/Chest: Effort normal and breath sounds normal.  Abdominal: Soft. Bowel sounds are normal. She exhibits no mass. There is no tenderness.  Musculoskeletal: Normal range of motion.  Lymphadenopathy:    She has no cervical adenopathy.  Neurological: She is alert and oriented to person, place, and time.  Skin: Skin is warm and dry. No rash noted.  Psychiatric: She has a normal mood and affect. Her behavior is normal.          Assessment & Plan:   Hypertension, well-controlled History of anxiety, depression, stable ADHD, stable.  Continue  Adderall Obesity.  Weight loss encouraged  Follow-up 6 months  Unity Luepke Pilar Plate

## 2016-08-27 ENCOUNTER — Other Ambulatory Visit: Payer: Self-pay | Admitting: Internal Medicine

## 2016-09-19 ENCOUNTER — Other Ambulatory Visit: Payer: Self-pay

## 2016-09-20 MED ORDER — ZOLPIDEM TARTRATE 10 MG PO TABS: 5.0000 mg | ORAL_TABLET | Freq: Every evening | ORAL | 3 refills | Status: DC | PRN

## 2016-09-20 NOTE — Telephone Encounter (Signed)
Called into pharmacy

## 2017-01-24 ENCOUNTER — Telehealth: Payer: Self-pay | Admitting: Internal Medicine

## 2017-01-24 NOTE — Telephone Encounter (Signed)
Pt need new Rx for Adderall and will not be able to keep the appointment 02/05/17 she will be out of town for that appointment due to her father being terminally ill ([place]).

## 2017-01-27 MED ORDER — AMPHETAMINE-DEXTROAMPHETAMINE 20 MG PO TABS: 20.0000 mg | ORAL_TABLET | Freq: Two times a day (BID) | ORAL | 0 refills | Status: DC

## 2017-01-27 NOTE — Telephone Encounter (Signed)
Will only print Rx for a 30 day supply. Rx printed awaiting to be signed

## 2017-01-27 NOTE — Telephone Encounter (Signed)
Message left on pt's voicemail.  Rx ready for pickup. Rx printed and signed.

## 2017-02-04 ENCOUNTER — Ambulatory Visit: Payer: 59 | Admitting: Internal Medicine

## 2017-02-05 ENCOUNTER — Ambulatory Visit: Payer: 59 | Admitting: Internal Medicine

## 2017-02-10 ENCOUNTER — Ambulatory Visit (INDEPENDENT_AMBULATORY_CARE_PROVIDER_SITE_OTHER): Payer: Managed Care, Other (non HMO) | Admitting: Internal Medicine

## 2017-02-10 ENCOUNTER — Encounter: Payer: Self-pay | Admitting: Internal Medicine

## 2017-02-10 VITALS — BP 154/88 | HR 74 | Temp 98.3°F | Ht 67.5 in | Wt 234.4 lb

## 2017-02-10 DIAGNOSIS — I10 Essential (primary) hypertension: Secondary | ICD-10-CM | POA: Diagnosis not present

## 2017-02-10 DIAGNOSIS — F5102 Adjustment insomnia: Secondary | ICD-10-CM | POA: Diagnosis not present

## 2017-02-10 MED ORDER — LORAZEPAM 0.5 MG PO TABS
0.5000 mg | ORAL_TABLET | Freq: Two times a day (BID) | ORAL | 1 refills | Status: DC | PRN
Start: 2017-02-10 — End: 2017-08-12

## 2017-02-10 NOTE — Progress Notes (Signed)
Subjective:    Patient ID: Misty Benson, female    DOB: 1961/03/23, 56 y.o.   MRN: [id number]  HPI  BP Readings from Last 3 Encounters:  02/10/17 (!) 154/88  08/06/16 124/80  07/29/16 102/42   56 year old patient who has a history of essential hypertension.  She has been under considerable situational stress due to the poor health of both parents who live in [place].  She has had some issues with insomnia.  She has been on chronic Ambienfor some time. She has tried melatonin with little benefit  She is requesting that an anxiolytic for when necessary use.  While dealing with legal matters in [place]  Past Medical History:  Diagnosis Date  . ADHD (attention deficit hyperactivity disorder)   . Anxiety   . Cancer ([place])    BASAL CELL FACE AND SHOULDERS , SQUAMOS CELL ON HAND AND MELANOMA LEFT ARM.Marland Kitchen.  . Cervix cancer ([place]) 1987   microinvasive  . Hypertension   . Leiomyoma 2013   multiple small  . Melanoma ([place])   . Obesity      Social History   Social History  . Marital status: Married    Spouse name: N/A  . Number of children: N/A  . Years of education: N/A   Occupational History  . Not on file.   Social History Main Topics  . Smoking status: Never Smoker  . Smokeless tobacco: Never Used  . Alcohol use 2.4 oz/week    4 Standard drinks or equivalent per week     Comment: WINE   . Drug use: No  . Sexual activity: Yes    Partners: Male    Birth control/ protection: Surgical     Comment: VASECTOMY-1st intercourse 57 yo-5 partners   Other Topics Concern  . Not on file   Social History Narrative  . No narrative on file    Past Surgical History:  Procedure Laterality Date  . arm surgery     to remove melanoma on left upper outter arm  . BUNIONECTOMY     LEFT FOOT WITH PINS  . CERVICAL CONE BIOPSY  1987  . CESAREAN SECTION    . DILATION AND CURETTAGE OF UTERUS     x2  . FOOT SURGERY    . MELANOMA EXCISION    . TONSILLECTOMY     as a child     Family History  Problem Relation Age of Onset  . Stroke Mother   . Osteoporosis Mother   . Hyperlipidemia Father   . Atrial fibrillation Father   . Breast cancer Maternal Grandmother 54    No Known Allergies  Current Outpatient Prescriptions on File Prior to Visit  Medication Sig Dispense Refill  . acetaminophen (TYLENOL) 500 MG tablet Take 1,000 mg by mouth every 6 (six) hours as needed for mild pain, moderate pain, fever or headache.    . albuterol (PROVENTIL HFA;VENTOLIN HFA) 108 (90 Base) MCG/ACT inhaler Inhale 1-2 puffs into the lungs every 6 (six) hours as needed for wheezing or shortness of breath. 1 Inhaler 0  . amphetamine-dextroamphetamine (ADDERALL) 20 MG tablet Take 1 tablet (20 mg total) by mouth 2 (two) times daily. 60 tablet 0  . amphetamine-dextroamphetamine (ADDERALL) 20 MG tablet Take 1 tablet (20 mg total) by mouth 2 (two) times daily. 60 tablet 0  . amphetamine-dextroamphetamine (ADDERALL) 20 MG tablet Take 1 tablet (20 mg total) by mouth 2 (two) times daily. 60 tablet 0  . citalopram (CELEXA) 20 MG tablet Take 1  tablet (20 mg total) by mouth daily. 90 tablet 4  . hydrochlorothiazide (MICROZIDE) 12.5 MG capsule Take 1 capsule (12.5 mg total) by mouth daily. 90 capsule 3  . ibuprofen (ADVIL,MOTRIN) 200 MG tablet Take 600-800 mg by mouth every 6 (six) hours as needed for fever, headache, mild pain, moderate pain or cramping.    . JUBLIA 10 % SOLN APPLY 1 DROP TOPICALLY DAILY 8 mL 11  . verapamil (CALAN-SR) 240 MG CR tablet Take 1 tablet (240 mg total) by mouth daily. 90 tablet 3  . Vitamin D, Ergocalciferol, (DRISDOL) 50000 units CAPS capsule TAKE 1 CAPSULE BY MOUTH EVERY 7 DAYS 4 capsule 0  . zolpidem (AMBIEN) 10 MG tablet Take 0.5-1 tablets (5-10 mg total) by mouth at bedtime as needed for sleep. 30 tablet 3   No current facility-administered medications on file prior to visit.     BP (!) 154/88 (BP Location: Left Arm, Patient Position: Sitting, Cuff Size:  Normal)   Pulse 74   Temp 98.3 F (36.8 C) (Oral)   Ht 5' 7.5" (1.715 m)   Wt 234 lb 6.4 oz (106.3 kg)   LMP 05/30/2012   SpO2 98%   BMI 36.17 kg/m    Review of Systems  Constitutional: Negative.   HENT: Negative for congestion, dental problem, hearing loss, rhinorrhea, sinus pressure, sore throat and tinnitus.   Eyes: Negative for pain, discharge and visual disturbance.  Respiratory: Negative for cough and shortness of breath.   Cardiovascular: Negative for chest pain, palpitations and leg swelling.  Gastrointestinal: Negative for abdominal distention, abdominal pain, blood in stool, constipation, diarrhea, nausea and vomiting.  Genitourinary: Negative for difficulty urinating, dysuria, flank pain, frequency, hematuria, pelvic pain, urgency, vaginal bleeding, vaginal discharge and vaginal pain.  Musculoskeletal: Negative for arthralgias, gait problem and joint swelling.  Skin: Negative for rash.  Neurological: Negative for dizziness, syncope, speech difficulty, weakness, numbness and headaches.  Hematological: Negative for adenopathy.  Psychiatric/Behavioral: Positive for sleep disturbance. Negative for agitation, behavioral problems and dysphoric mood. The patient is nervous/anxious.        Objective:   Physical Exam  Constitutional: She is oriented to person, place, and time. She appears well-developed and well-nourished.  Blood pressure 150/90  HENT:  Head: Normocephalic.  Right Ear: External ear normal.  Left Ear: External ear normal.  Mouth/Throat: Oropharynx is clear and moist.  Eyes: Conjunctivae and EOM are normal. Pupils are equal, round, and reactive to light.  Neck: Normal range of motion. Neck supple. No thyromegaly present.  Cardiovascular: Normal rate, regular rhythm, normal heart sounds and intact distal pulses.   Pulmonary/Chest: Effort normal and breath sounds normal.  Abdominal: Soft. Bowel sounds are normal. She exhibits no mass. There is no tenderness.   Musculoskeletal: Normal range of motion.  Lymphadenopathy:    She has no cervical adenopathy.  Neurological: She is alert and oriented to person, place, and time.  Skin: Skin is warm and dry. No rash noted.  Psychiatric: She has a normal mood and affect. Her behavior is normal.          Assessment & Plan:   Essential hypertension.  Patient will obtain a home blood pressure monitor for tracking. Situational stress Insomnia  Medications updated Home blood pressure monitoring.  Encouraged  CPX 6 months  Nyoka Cowden

## 2017-02-10 NOTE — Patient Instructions (Addendum)
Limit your sodium (Salt) intake  Please check your blood pressure on a regular basis.  If it is consistently greater than 150/90, please make an office appointment.    It is important that you exercise regularly, at least 20 minutes 3 to 4 times per week.  If you develop chest pain or shortness of breath seek  medical attention.  Insomnia Insomnia is a sleep disorder that makes it difficult to fall asleep or to stay asleep. Insomnia can cause tiredness (fatigue), low energy, difficulty concentrating, mood swings, and poor performance at work or school. There are three different ways to classify insomnia:  Difficulty falling asleep.  Difficulty staying asleep.  Waking up too early in the morning. Any type of insomnia can be long-term (chronic) or short-term (acute). Both are common. Short-term insomnia usually lasts for three months or less. Chronic insomnia occurs at least three times a week for longer than three months. What are the causes? Insomnia may be caused by another condition, situation, or substance, such as:  Anxiety.  Certain medicines.  Gastroesophageal reflux disease (GERD) or other gastrointestinal conditions.  Asthma or other breathing conditions.  Restless legs syndrome, sleep apnea, or other sleep disorders.  Chronic pain.  Menopause. This may include hot flashes.  Stroke.  Abuse of alcohol, tobacco, or illegal drugs.  Depression.  Caffeine.  Neurological disorders, such as Alzheimer disease.  An overactive thyroid (hyperthyroidism). The cause of insomnia may not be known. What increases the risk? Risk factors for insomnia include:  Gender. Women are more commonly affected than men.  Age. Insomnia is more common as you get older.  Stress. This may involve your professional or personal life.  Income. Insomnia is more common in people with lower income.  Lack of exercise.  Irregular work schedule or night shifts.  Traveling between different  time zones. What are the signs or symptoms? If you have insomnia, trouble falling asleep or trouble staying asleep is the main symptom. This may lead to other symptoms, such as:  Feeling fatigued.  Feeling nervous about going to sleep.  Not feeling rested in the morning.  Having trouble concentrating.  Feeling irritable, anxious, or depressed. How is this treated? Treatment for insomnia depends on the cause. If your insomnia is caused by an underlying condition, treatment will focus on addressing the condition. Treatment may also include:  Medicines to help you sleep.  Counseling or therapy.  Lifestyle adjustments. Follow these instructions at home:  Take medicines only as directed by your health care provider.  Keep regular sleeping and waking hours. Avoid naps.  Keep a sleep diary to help you and your health care provider figure out what could be causing your insomnia. Include:  When you sleep.  When you wake up during the night.  How well you sleep.  How rested you feel the next day.  Any side effects of medicines you are taking.  What you eat and drink.  Make your bedroom a comfortable place where it is easy to fall asleep:  Put up shades or special blackout curtains to block light from outside.  Use a white noise machine to block noise.  Keep the temperature cool.  Exercise regularly as directed by your health care provider. Avoid exercising right before bedtime.  Use relaxation techniques to manage stress. Ask your health care provider to suggest some techniques that may work well for you. These may include:  Breathing exercises.  Routines to release muscle tension.  Visualizing peaceful scenes.  Cut back  on alcohol, caffeinated beverages, and cigarettes, especially close to bedtime. These can disrupt your sleep.  Do not overeat or eat spicy foods right before bedtime. This can lead to digestive discomfort that can make it hard for you to  sleep.  Limit screen use before bedtime. This includes:  Watching TV.  Using your smartphone, tablet, and computer.  Stick to a routine. This can help you fall asleep faster. Try to do a quiet activity, brush your teeth, and go to bed at the same time each night.  Get out of bed if you are still awake after 15 minutes of trying to sleep. Keep the lights down, but try reading or doing a quiet activity. When you feel sleepy, go back to bed.  Make sure that you drive carefully. Avoid driving if you feel very sleepy.  Keep all follow-up appointments as directed by your health care provider. This is important. Contact a health care provider if:  You are tired throughout the day or have trouble in your daily routine due to sleepiness.  You continue to have sleep problems or your sleep problems get worse. Get help right away if:  You have serious thoughts about hurting yourself or someone else. This information is not intended to replace advice given to you by your health care provider. Make sure you discuss any questions you have with your health care provider. Document Released: 10/25/2000 Document Revised: 03/29/2016 Document Reviewed: 07/29/2014 Elsevier Interactive Patient Education  2017 [employer].

## 2017-02-10 NOTE — Progress Notes (Signed)
Pre visit review using our clinic review tool, if applicable. No additional management support is needed unless otherwise documented below in the visit note. 

## 2017-02-16 ENCOUNTER — Other Ambulatory Visit: Payer: Self-pay | Admitting: Internal Medicine

## 2017-04-17 ENCOUNTER — Telehealth: Payer: Self-pay | Admitting: Internal Medicine

## 2017-04-17 NOTE — Telephone Encounter (Signed)
Pt need new rx generic adderall 20 mg

## 2017-04-21 MED ORDER — AMPHETAMINE-DEXTROAMPHETAMINE 20 MG PO TABS
20.0000 mg | ORAL_TABLET | Freq: Two times a day (BID) | ORAL | 0 refills | Status: DC
Start: 2017-04-21 — End: 2017-08-12

## 2017-04-21 MED ORDER — AMPHETAMINE-DEXTROAMPHETAMINE 20 MG PO TABS: 20.0000 mg | ORAL_TABLET | Freq: Two times a day (BID) | ORAL | 0 refills | Status: DC

## 2017-04-21 NOTE — Telephone Encounter (Signed)
Pt notified Rx ready for pickup. Rx printed and signed.  

## 2017-04-21 NOTE — Telephone Encounter (Signed)
Rx's printed awaiting to be signed.  

## 2017-06-02 ENCOUNTER — Other Ambulatory Visit: Payer: Self-pay | Admitting: Internal Medicine

## 2017-06-02 DIAGNOSIS — Z1231 Encounter for screening mammogram for malignant neoplasm of breast: Secondary | ICD-10-CM

## 2017-07-08 ENCOUNTER — Ambulatory Visit
Admission: RE | Admit: 2017-07-08 | Discharge: 2017-07-08 | Disposition: A | Discharge status: Home / Self Care | Payer: Managed Care, Other (non HMO) | Source: Ambulatory Visit | Attending: Internal Medicine | Admitting: Internal Medicine

## 2017-07-08 DIAGNOSIS — Z1231 Encounter for screening mammogram for malignant neoplasm of breast: Secondary | ICD-10-CM

## 2017-07-18 ENCOUNTER — Encounter: Payer: Self-pay | Admitting: Gynecology

## 2017-07-18 ENCOUNTER — Ambulatory Visit (INDEPENDENT_AMBULATORY_CARE_PROVIDER_SITE_OTHER): Payer: Managed Care, Other (non HMO) | Admitting: Gynecology

## 2017-07-18 VITALS — BP 124/78 | Ht 68.0 in | Wt 231.0 lb

## 2017-07-18 DIAGNOSIS — N952 Postmenopausal atrophic vaginitis: Secondary | ICD-10-CM

## 2017-07-18 DIAGNOSIS — Z01411 Encounter for gynecological examination (general) (routine) with abnormal findings: Secondary | ICD-10-CM

## 2017-07-18 DIAGNOSIS — Z1329 Encounter for screening for other suspected endocrine disorder: Secondary | ICD-10-CM

## 2017-07-18 DIAGNOSIS — N816 Rectocele: Secondary | ICD-10-CM | POA: Diagnosis not present

## 2017-07-18 DIAGNOSIS — E559 Vitamin D deficiency, unspecified: Secondary | ICD-10-CM | POA: Diagnosis not present

## 2017-07-18 DIAGNOSIS — E7800 Pure hypercholesterolemia, unspecified: Secondary | ICD-10-CM

## 2017-07-18 NOTE — Patient Instructions (Signed)
Followup in one year for annual exam, sooner if any issues 

## 2017-07-18 NOTE — Progress Notes (Signed)
    Misty Benson 02/14/1961 [id number]        56 y.o.  [id number] for annual gynecologic exam.  Doing well without gynecologic complaints.  Past medical history,surgical history, problem list, medications, allergies, family history and social history were all reviewed and documented as reviewed in the EPIC chart.  ROS:  Performed with pertinent positives and negatives included in the history, assessment and plan.   Additional significant findings :  None   Exam: Misty Benson assistant Vitals:   07/18/17 0944  BP: 124/78  Weight: 231 lb (104.8 kg)  Height: 5\' 8"  (1.727 m)   Body mass index is 35.12 kg/m.  General appearance:  Normal affect, orientation and appearance. Skin: Grossly normal HEENT: Without gross lesions.  No cervical or supraclavicular adenopathy. Thyroid normal.  Lungs:  Clear without wheezing, rales or rhonchi Cardiac: RR, without RMG Abdominal:  Soft, nontender, without masses, guarding, rebound, organomegaly or hernia Breasts:  Examined lying and sitting without masses, retractions, discharge or axillary adenopathy. Pelvic:  Ext, BUS, Vagina: With atrophic changes. First-degree rectocele noted.  Cervix: With atrophic changes  Uterus: Anteverted, normal size, shape and contour, midline and mobile nontender   Adnexa: Without masses or tenderness    Anus and perineum: Normal   Rectovaginal: Normal sphincter tone without palpated masses or tenderness.    Assessment/Plan:  56 y.o. [id number] female for annual gynecologic exam.   1. Postmenopausal/atrophic genital changes. No significant hot flushes, night sweats, vaginal dryness or bleeding. Report any issues or bleeding. 2. Mild rectocele. Asymptomatic to the patient. Continue to monitor and report any issues. 3. Mammography 06/2017. Continue with annual mammography next year. Breast exam normal today. 4. Pap smear 2017. No Pap smear done today. History of Pap smear/HPV 2015. History of microinvasive carcinoma the  cervix status post cone biopsy 1987 with negative Pap smears since. We'll continue to monitor with Pap smears less frequent intervals per current screening guidelines. 5. DEXA never. Will plan further into the menopause. Low vitamin D through her primary physician's office last year. Check vitamin D level today. 6. Colonoscopy 2011. Repeat at their recommended interval. 7. Health maintenance. Patient has appointment to see her primary physician this coming month. Requests baseline labs to have available for him. CBC, CMP, lipid profile, TSH, vitamin D, urinalysis ordered. Follow up in one year, sooner as needed.   Anastasio Auerbach MD, 10:27 AM 07/18/2017

## 2017-07-19 LAB — COMPREHENSIVE METABOLIC PANEL
AG Ratio: 1.6 (calc) (ref 1.0–2.5)
ALKALINE PHOSPHATASE (APISO): 63 U/L (ref 33–130)
ALT: 17 U/L (ref 6–29)
AST: 14 U/L (ref 10–35)
Albumin: 4.1 g/dL (ref 3.6–5.1)
BUN: 20 mg/dL (ref 7–25)
CO2: 26 mmol/L (ref 20–32)
CREATININE: 0.68 mg/dL (ref 0.50–1.05)
Calcium: 9.7 mg/dL (ref 8.6–10.4)
Chloride: 106 mmol/L (ref 98–110)
GLOBULIN: 2.5 g/dL (ref 1.9–3.7)
Glucose, Bld: 94 mg/dL (ref 65–99)
Potassium: 4.3 mmol/L (ref 3.5–5.3)
Sodium: 141 mmol/L (ref 135–146)
Total Bilirubin: 0.5 mg/dL (ref 0.2–1.2)
Total Protein: 6.6 g/dL (ref 6.1–8.1)

## 2017-07-19 LAB — CBC WITH DIFFERENTIAL/PLATELET
BASOS PCT: 0.6 %
Basophils Absolute: 31 cells/uL (ref 0–200)
EOS ABS: 138 {cells}/uL (ref 15–500)
Eosinophils Relative: 2.7 %
HCT: 40.2 % (ref 35.0–45.0)
Hemoglobin: 13.4 g/dL (ref 11.7–15.5)
Lymphs Abs: 1255 cells/uL (ref 850–3900)
MCH: 30 pg (ref 27.0–33.0)
MCHC: 33.3 g/dL (ref 32.0–36.0)
MCV: 90.1 fL (ref 80.0–100.0)
MPV: 11 fL (ref 7.5–12.5)
Monocytes Relative: 9.6 %
NEUTROS PCT: 62.5 %
Neutro Abs: 3188 cells/uL (ref 1500–7800)
PLATELETS: 270 10*3/uL (ref 140–400)
RBC: 4.46 10*6/uL (ref 3.80–5.10)
RDW: 12.5 % (ref 11.0–15.0)
TOTAL LYMPHOCYTE: 24.6 %
WBC mixed population: 490 cells/uL (ref 200–950)
WBC: 5.1 10*3/uL (ref 3.8–10.8)

## 2017-07-19 LAB — VITAMIN D 25 HYDROXY (VIT D DEFICIENCY, FRACTURES): Vit D, 25-Hydroxy: 39 ng/mL (ref 30–100)

## 2017-07-19 LAB — LIPID PANEL
CHOL/HDL RATIO: 2.5 (calc) (ref <5.0)
CHOLESTEROL: 217 mg/dL — AB (ref <200)
HDL: 86 mg/dL (ref >50)
LDL CHOLESTEROL (CALC): 109 mg/dL — AB
Non-HDL Cholesterol (Calc): 131 mg/dL (calc) — ABNORMAL HIGH (ref <130)
TRIGLYCERIDES: 113 mg/dL (ref <150)

## 2017-07-19 LAB — TSH: TSH: 1.46 m[IU]/L (ref 0.40–4.50)

## 2017-07-20 LAB — URINALYSIS W MICROSCOPIC + REFLEX CULTURE
BACTERIA UA: NONE SEEN /HPF
Bilirubin Urine: NEGATIVE
Glucose, UA: NEGATIVE
Hgb urine dipstick: NEGATIVE
Hyaline Cast: NONE SEEN /LPF
KETONES UR: NEGATIVE
NITRITES URINE, INITIAL: NEGATIVE
PH: 5.5 (ref 5.0–8.0)
Protein, ur: NEGATIVE
SPECIFIC GRAVITY, URINE: 1.021 (ref 1.001–1.03)
Squamous Epithelial / LPF: NONE SEEN /HPF (ref <=5)

## 2017-07-20 LAB — URINE CULTURE
MICRO NUMBER:: 80991164
Result:: NO GROWTH
SPECIMEN QUALITY:: ADEQUATE

## 2017-07-20 LAB — CULTURE INDICATED

## 2017-07-29 ENCOUNTER — Encounter (INDEPENDENT_AMBULATORY_CARE_PROVIDER_SITE_OTHER): Payer: Self-pay

## 2017-08-12 ENCOUNTER — Ambulatory Visit (INDEPENDENT_AMBULATORY_CARE_PROVIDER_SITE_OTHER): Payer: BLUE CROSS/BLUE SHIELD | Admitting: Internal Medicine

## 2017-08-12 ENCOUNTER — Encounter: Payer: Self-pay | Admitting: Internal Medicine

## 2017-08-12 VITALS — BP 108/60 | HR 68 | Temp 97.8°F | Ht 68.0 in | Wt 232.8 lb

## 2017-08-12 DIAGNOSIS — Z0000 Encounter for general adult medical examination without abnormal findings: Secondary | ICD-10-CM

## 2017-08-12 DIAGNOSIS — Z23 Encounter for immunization: Secondary | ICD-10-CM | POA: Diagnosis not present

## 2017-08-12 MED ORDER — VERAPAMIL HCL ER 240 MG PO TBCR: 240.0000 mg | EXTENDED_RELEASE_TABLET | Freq: Every day | ORAL | 3 refills | Status: DC

## 2017-08-12 MED ORDER — CITALOPRAM HYDROBROMIDE 20 MG PO TABS: 20.0000 mg | ORAL_TABLET | Freq: Every day | ORAL | 3 refills | Status: DC

## 2017-08-12 MED ORDER — AMPHETAMINE-DEXTROAMPHETAMINE 20 MG PO TABS: 20.0000 mg | ORAL_TABLET | Freq: Two times a day (BID) | ORAL | 0 refills | Status: DC

## 2017-08-12 MED ORDER — HYDROCHLOROTHIAZIDE 12.5 MG PO CAPS: 12.5000 mg | ORAL_CAPSULE | Freq: Every day | ORAL | 3 refills | Status: DC

## 2017-08-12 MED ORDER — LORAZEPAM 0.5 MG PO TABS: 0.5000 mg | ORAL_TABLET | Freq: Two times a day (BID) | ORAL | 1 refills | Status: DC | PRN

## 2017-08-12 MED ORDER — ZOLPIDEM TARTRATE 10 MG PO TABS: 5.0000 mg | ORAL_TABLET | Freq: Every evening | ORAL | 3 refills | Status: DC | PRN

## 2017-08-12 MED ORDER — ALBUTEROL SULFATE HFA 108 (90 BASE) MCG/ACT IN AERS: 1.0000 | INHALATION_SPRAY | Freq: Four times a day (QID) | RESPIRATORY_TRACT | 0 refills | Status: DC | PRN

## 2017-08-12 MED ORDER — IBUPROFEN 200 MG PO TABS: 600.0000 mg | ORAL_TABLET | Freq: Four times a day (QID) | ORAL | 6 refills | Status: AC | PRN

## 2017-08-12 NOTE — Addendum Note (Signed)
Addended by: Abelardo Diesel on: 08/12/2017 01:03 PM   Modules accepted: Orders

## 2017-08-12 NOTE — Progress Notes (Signed)
Subjective:    Patient ID: Misty Benson, female    DOB: 1961/06/30, 56 y.o.   MRN: [id number]  HPI 56 year old patient who is seen today for a preventive health examination  She is following the by gynecology.  Colonoscopy 2011.  Mammogram earlier this year  Medical issues include exogenous obesity, ADHD essential hypertension. She has a history of melanoma and is followed annually by dermatology.  She has had resections involving her left arm and left leg She continues to have considerable situational stress related to her parents health and legal issues in [place].  Past Medical History:  Diagnosis Date  . ADHD (attention deficit hyperactivity disorder)   . Anxiety   . Cancer ([place])    BASAL CELL FACE AND SHOULDERS , SQUAMOS CELL ON HAND AND MELANOMA LEFT ARM.Marland Kitchen.  . Cervix cancer ([place]) 1987   microinvasive  . Hypertension   . Leiomyoma 2013   multiple small  . Melanoma ([place])   . Obesity      Social History   Social History  . Marital status: Married    Spouse name: N/A  . Number of children: N/A  . Years of education: N/A   Occupational History  . Not on file.   Social History Main Topics  . Smoking status: Never Smoker  . Smokeless tobacco: Never Used  . Alcohol use 2.4 oz/week    4 Standard drinks or equivalent per week     Comment: WINE   . Drug use: No  . Sexual activity: Yes    Partners: Male    Birth control/ protection: Surgical     Comment: VASECTOMY-1st intercourse 63 yo-5 partners   Other Topics Concern  . Not on file   Social History Narrative  . No narrative on file    Past Surgical History:  Procedure Laterality Date  . arm surgery     to remove melanoma on left upper outter arm  . BUNIONECTOMY     LEFT FOOT WITH PINS  . CERVICAL CONE BIOPSY  1987  . CESAREAN SECTION    . DILATION AND CURETTAGE OF UTERUS     x2  . FOOT SURGERY    . MELANOMA EXCISION    . TONSILLECTOMY     as a child    Family History  Problem Relation Age of  Onset  . Stroke Mother   . Osteoporosis Mother   . Hyperlipidemia Father   . Atrial fibrillation Father   . Cancer Father        Prostate  . Breast cancer Maternal Grandmother 42    No Known Allergies  Current Outpatient Prescriptions on File Prior to Visit  Medication Sig Dispense Refill  . acetaminophen (TYLENOL) 500 MG tablet Take 1,000 mg by mouth every 6 (six) hours as needed for mild pain, moderate pain, fever or headache.    . albuterol (PROVENTIL HFA;VENTOLIN HFA) 108 (90 Base) MCG/ACT inhaler Inhale 1-2 puffs into the lungs every 6 (six) hours as needed for wheezing or shortness of breath. 1 Inhaler 0  . amphetamine-dextroamphetamine (ADDERALL) 20 MG tablet Take 1 tablet (20 mg total) by mouth 2 (two) times daily. 60 tablet 0  . citalopram (CELEXA) 20 MG tablet TAKE 1 TABLET (20 MG TOTAL) BY MOUTH DAILY. 90 tablet 3  . hydrochlorothiazide (MICROZIDE) 12.5 MG capsule Take 1 capsule (12.5 mg total) by mouth daily. 90 capsule 3  . ibuprofen (ADVIL,MOTRIN) 200 MG tablet Take 600-800 mg by mouth every 6 (six) hours as needed  for fever, headache, mild pain, moderate pain or cramping.    Marland Kitchen LORazepam (ATIVAN) 0.5 MG tablet Take 1 tablet (0.5 mg total) by mouth 2 (two) times daily as needed for anxiety. 30 tablet 1  . verapamil (CALAN-SR) 240 MG CR tablet Take 1 tablet (240 mg total) by mouth daily. 90 tablet 3  . zolpidem (AMBIEN) 10 MG tablet Take 0.5-1 tablets (5-10 mg total) by mouth at bedtime as needed for sleep. 30 tablet 3   No current facility-administered medications on file prior to visit.     BP 108/60 (BP Location: Left Arm, Patient Position: Sitting, Cuff Size: Normal)   Pulse 68   Temp 97.8 F (36.6 C) (Oral)   Ht 5\' 8"  (1.727 m)   Wt 232 lb 12.8 oz (105.6 kg)   LMP 05/30/2012   SpO2 96%   BMI 35.40 kg/m      Review of Systems  Constitutional: Negative.   HENT: Negative for congestion, dental problem, hearing loss, rhinorrhea, sinus pressure, sore throat and  tinnitus.   Eyes: Negative for pain, discharge and visual disturbance.  Respiratory: Negative for cough and shortness of breath.   Cardiovascular: Negative for chest pain, palpitations and leg swelling.  Gastrointestinal: Negative for abdominal distention, abdominal pain, blood in stool, constipation, diarrhea, nausea and vomiting.  Genitourinary: Negative for difficulty urinating, dysuria, flank pain, frequency, hematuria, pelvic pain, urgency, vaginal bleeding, vaginal discharge and vaginal pain.  Musculoskeletal: Negative for arthralgias, gait problem and joint swelling.  Skin: Negative for rash.  Neurological: Negative for dizziness, syncope, speech difficulty, weakness, numbness and headaches.  Hematological: Negative for adenopathy.  Psychiatric/Behavioral: Positive for sleep disturbance. Negative for agitation, behavioral problems and dysphoric mood. The patient is nervous/anxious.        Objective:   Physical Exam  Constitutional: She is oriented to person, place, and time. She appears well-developed and well-nourished.  Weight 232 Repeat blood pressure 130/80  HENT:  Head: Normocephalic and atraumatic.  Right Ear: External ear normal.  Left Ear: External ear normal.  Mouth/Throat: Oropharynx is clear and moist.  Pharyngeal crowding with low hanging soft palate  Eyes: Conjunctivae and EOM are normal.  Neck: Normal range of motion. Neck supple. No JVD present. No thyromegaly present.  Cardiovascular: Normal rate, regular rhythm, normal heart sounds and intact distal pulses.   No murmur heard. Pulmonary/Chest: Effort normal and breath sounds normal. She has no wheezes. She has no rales.  Abdominal: Soft. Bowel sounds are normal. She exhibits no distension and no mass. There is no tenderness. There is no rebound and no guarding.  Musculoskeletal: Normal range of motion. She exhibits no edema or tenderness.  Neurological: She is alert and oriented to person, place, and time. She  has normal reflexes. No cranial nerve deficit. She exhibits normal muscle tone. Coordination normal.  Skin: Skin is warm and dry. No rash noted.  Psychiatric: She has a normal mood and affect. Her behavior is normal.          Assessment & Plan:   Preventive health examination Essential hypertension, well-controlled Obesity.  Weight loss encouraged OSA suspect.  History of snoring but no significant daytime sleepiness.  She will consider a sleep study Situational stress ADHD.  Adderall refilled  All medications updated  Laboratory studies from OB/GYN reviewed  Continue home blood pressure monitoring and efforts at weight loss Follow-up 6 months or as needed  [employer]

## 2017-08-12 NOTE — Patient Instructions (Signed)
Limit your sodium (Salt) intake    It is important that you exercise regularly, at least 20 minutes 3 to 4 times per week.  If you develop chest pain or shortness of breath seek  medical attention.  You need to lose weight.  Consider a lower calorie diet and regular exercise.  Take a calcium supplement, plus 1500-2000  units of vitamin D  Consider home sleep study  Follow-up 6 months

## 2017-09-12 ENCOUNTER — Other Ambulatory Visit: Payer: Self-pay | Admitting: Gynecology

## 2017-09-18 ENCOUNTER — Other Ambulatory Visit: Payer: Self-pay

## 2017-09-18 ENCOUNTER — Other Ambulatory Visit: Payer: Self-pay | Admitting: Gynecology

## 2017-09-29 ENCOUNTER — Other Ambulatory Visit: Payer: Self-pay | Admitting: Gynecology

## 2017-10-01 ENCOUNTER — Telehealth: Payer: Self-pay | Admitting: Internal Medicine

## 2017-10-01 NOTE — Telephone Encounter (Signed)
Called [facility] and they said they never got the RX. Sending back to the provider.

## 2017-10-01 NOTE — Telephone Encounter (Signed)
Copied from [place] [id number]. Topic: Quick Communication - See Telephone Encounter >> Oct 01, 2017 11:02 AM Conception Chancy, NT wrote: CRM for notification. See Telephone encounter for:  10/01/17.  Pt states she has called [facility] and they are saying she does not have any refills and it is showing me 3 refills. And they are not letting her fill it. Please contact pt when it is sent over to pharmacy.  [phone] first [phone]

## 2017-10-01 NOTE — Telephone Encounter (Signed)
Called [facility] and spoke with Santiago Glad pharmacist. [facility] states that they never received the order for Ambien that was phoned in. Called and updated the pt. Will route this back to the office.

## 2017-10-06 ENCOUNTER — Other Ambulatory Visit: Payer: Self-pay | Admitting: Internal Medicine

## 2017-10-06 MED ORDER — ZOLPIDEM TARTRATE 10 MG PO TABS: 5.0000 mg | ORAL_TABLET | Freq: Every evening | ORAL | 3 refills | Status: DC | PRN

## 2017-10-06 NOTE — Telephone Encounter (Signed)
Spoke with Lennette Bihari pharmacist at [facility] and gave him verbal orders to refill medication.

## 2018-01-29 DIAGNOSIS — H524 Presbyopia: Secondary | ICD-10-CM | POA: Diagnosis not present

## 2018-02-04 DIAGNOSIS — Z85820 Personal history of malignant melanoma of skin: Secondary | ICD-10-CM | POA: Diagnosis not present

## 2018-02-04 DIAGNOSIS — L570 Actinic keratosis: Secondary | ICD-10-CM | POA: Diagnosis not present

## 2018-02-04 DIAGNOSIS — L738 Other specified follicular disorders: Secondary | ICD-10-CM | POA: Diagnosis not present

## 2018-02-04 DIAGNOSIS — L821 Other seborrheic keratosis: Secondary | ICD-10-CM | POA: Diagnosis not present

## 2018-02-04 DIAGNOSIS — Z85828 Personal history of other malignant neoplasm of skin: Secondary | ICD-10-CM | POA: Diagnosis not present

## 2018-02-10 ENCOUNTER — Ambulatory Visit: Payer: BLUE CROSS/BLUE SHIELD | Admitting: Internal Medicine

## 2018-02-18 ENCOUNTER — Encounter: Payer: Self-pay | Admitting: Internal Medicine

## 2018-02-18 ENCOUNTER — Ambulatory Visit (INDEPENDENT_AMBULATORY_CARE_PROVIDER_SITE_OTHER): Payer: BLUE CROSS/BLUE SHIELD | Admitting: Internal Medicine

## 2018-02-18 VITALS — BP 120/80 | HR 74 | Temp 98.1°F | Wt 242.0 lb

## 2018-02-18 DIAGNOSIS — I10 Essential (primary) hypertension: Secondary | ICD-10-CM

## 2018-02-18 DIAGNOSIS — F900 Attention-deficit hyperactivity disorder, predominantly inattentive type: Secondary | ICD-10-CM | POA: Diagnosis not present

## 2018-02-18 MED ORDER — AMPHETAMINE-DEXTROAMPHETAMINE 20 MG PO TABS: 20.0000 mg | ORAL_TABLET | Freq: Two times a day (BID) | ORAL | 0 refills | Status: DC

## 2018-02-18 MED ORDER — LORAZEPAM 0.5 MG PO TABS: 0.5000 mg | ORAL_TABLET | Freq: Two times a day (BID) | ORAL | 1 refills | Status: DC | PRN

## 2018-02-18 MED ORDER — ALBUTEROL SULFATE HFA 108 (90 BASE) MCG/ACT IN AERS: 1.0000 | INHALATION_SPRAY | Freq: Four times a day (QID) | RESPIRATORY_TRACT | 0 refills | Status: DC | PRN

## 2018-02-18 NOTE — Progress Notes (Signed)
Subjective:    Patient ID: Misty Benson, female    DOB: 09-Sep-1961, 57 y.o.   MRN: [id number]  HPI  Wt Readings from Last 3 Encounters:  02/18/18 242 lb (109.8 kg)  08/12/17 232 lb 12.8 oz (105.6 kg)  07/18/17 231 lb (23.21 kg)   57 year old patient who is seen today for her six-month follow-up.  She has essential hypertension.  She has ADHD and remains on Adderall. Still some situational stress with her mother residing in an assisted living facility in [place].  Her father died in 05-21-23 of last year  Past Medical History:  Diagnosis Date  . ADHD (attention deficit hyperactivity disorder)   . Anxiety   . Cancer ([place])    BASAL CELL FACE AND SHOULDERS , SQUAMOS CELL ON HAND AND MELANOMA LEFT ARM.Marland Kitchen.  . Cervix cancer ([employer]) 1987   microinvasive  . Hypertension   . Leiomyoma 2013   multiple small  . Melanoma ([place])   . Obesity      Social History   Socioeconomic History  . Marital status: Married    Spouse name: Not on file  . Number of children: Not on file  . Years of education: Not on file  . Highest education level: Not on file  Occupational History  . Not on file  Social Needs  . Financial resource strain: Not on file  . Food insecurity:    Worry: Not on file    Inability: Not on file  . Transportation needs:    Medical: Not on file    Non-medical: Not on file  Tobacco Use  . Smoking status: Never Smoker  . Smokeless tobacco: Never Used  Substance and Sexual Activity  . Alcohol use: Yes    Alcohol/week: 2.4 oz    Types: 4 Standard drinks or equivalent per week    Comment: WINE   . Drug use: No  . Sexual activity: Yes    Partners: Male    Birth control/protection: Surgical    Comment: VASECTOMY-1st intercourse 57 yo-5 partners  Lifestyle  . Physical activity:    Days per week: Not on file    Minutes per session: Not on file  . Stress: Not on file  Relationships  . Social connections:    Talks on phone: Not on file    Gets together: Not on file   Attends religious service: Not on file    Active member of club or organization: Not on file    Attends meetings of clubs or organizations: Not on file    Relationship status: Not on file  . Intimate partner violence:    Fear of current or ex partner: Not on file    Emotionally abused: Not on file    Physically abused: Not on file    Forced sexual activity: Not on file  Other Topics Concern  . Not on file  Social History Narrative  . Not on file    Past Surgical History:  Procedure Laterality Date  . arm surgery     to remove melanoma on left upper outter arm  . BUNIONECTOMY     LEFT FOOT WITH PINS  . CERVICAL CONE BIOPSY  1987  . CESAREAN SECTION    . DILATION AND CURETTAGE OF UTERUS     x2  . FOOT SURGERY    . MELANOMA EXCISION    . TONSILLECTOMY     as a child    Family History  Problem Relation Age of Onset  .  Stroke Mother   . Osteoporosis Mother   . Hyperlipidemia Father   . Atrial fibrillation Father   . Cancer Father        Prostate  . Breast cancer Maternal Grandmother 91    No Known Allergies  Current Outpatient Medications on File Prior to Visit  Medication Sig Dispense Refill  . acetaminophen (TYLENOL) 500 MG tablet Take 1,000 mg by mouth every 6 (six) hours as needed for mild pain, moderate pain, fever or headache.    . albuterol (PROVENTIL HFA;VENTOLIN HFA) 108 (90 Base) MCG/ACT inhaler Inhale 1-2 puffs into the lungs every 6 (six) hours as needed for wheezing or shortness of breath. 1 Inhaler 0  . amphetamine-dextroamphetamine (ADDERALL) 20 MG tablet Take 1 tablet (20 mg total) by mouth 2 (two) times daily. 60 tablet 0  . amphetamine-dextroamphetamine (ADDERALL) 20 MG tablet Take 1 tablet (20 mg total) by mouth 2 (two) times daily. 60 tablet 0  . citalopram (CELEXA) 20 MG tablet Take 1 tablet (20 mg total) by mouth daily. 90 tablet 3  . hydrochlorothiazide (MICROZIDE) 12.5 MG capsule Take 1 capsule (12.5 mg total) by mouth daily. 90 capsule 3  .  ibuprofen (ADVIL,MOTRIN) 200 MG tablet Take 3-4 tablets (600-800 mg total) by mouth every 6 (six) hours as needed for fever, headache, mild pain, moderate pain or cramping. 30 tablet 6  . LORazepam (ATIVAN) 0.5 MG tablet Take 1 tablet (0.5 mg total) by mouth 2 (two) times daily as needed for anxiety. 30 tablet 1  . verapamil (CALAN-SR) 240 MG CR tablet Take 1 tablet (240 mg total) by mouth daily. 90 tablet 3  . zolpidem (AMBIEN) 10 MG tablet Take 0.5-1 tablets (5-10 mg total) by mouth at bedtime as needed for sleep. 30 tablet 3   No current facility-administered medications on file prior to visit.     BP 120/80 (BP Location: Right Arm, Patient Position: Sitting, Cuff Size: Large)   Pulse 74   Temp 98.1 F (36.7 C) (Oral)   Wt 242 lb (109.8 kg)   LMP 05/30/2012   SpO2 97%   BMI 36.80 kg/m       Review of Systems  Constitutional: Positive for unexpected weight change.  HENT: Negative for congestion, dental problem, hearing loss, rhinorrhea, sinus pressure, sore throat and tinnitus.   Eyes: Negative for pain, discharge and visual disturbance.  Respiratory: Negative for cough and shortness of breath.   Cardiovascular: Negative for chest pain, palpitations and leg swelling.  Gastrointestinal: Negative for abdominal distention, abdominal pain, blood in stool, constipation, diarrhea, nausea and vomiting.  Genitourinary: Negative for difficulty urinating, dysuria, flank pain, frequency, hematuria, pelvic pain, urgency, vaginal bleeding, vaginal discharge and vaginal pain.  Musculoskeletal: Negative for arthralgias, gait problem and joint swelling.  Skin: Negative for rash.  Neurological: Negative for dizziness, syncope, speech difficulty, weakness, numbness and headaches.  Hematological: Negative for adenopathy.  Psychiatric/Behavioral: Positive for decreased concentration. Negative for agitation, behavioral problems and dysphoric mood. The patient is nervous/anxious.        Objective:     Physical Exam  Constitutional: She is oriented to person, place, and time. She appears well-developed and well-nourished.  Weight 242  HENT:  Head: Normocephalic.  Right Ear: External ear normal.  Left Ear: External ear normal.  Mouth/Throat: Oropharynx is clear and moist.  Eyes: Pupils are equal, round, and reactive to light. Conjunctivae and EOM are normal.  Neck: Normal range of motion. Neck supple. No thyromegaly present.  Cardiovascular: Normal rate, regular rhythm,  normal heart sounds and intact distal pulses.  Pulmonary/Chest: Effort normal and breath sounds normal.  Abdominal: Soft. Bowel sounds are normal. She exhibits no mass. There is no tenderness.  Musculoskeletal: Normal range of motion.  Lymphadenopathy:    She has no cervical adenopathy.  Neurological: She is alert and oriented to person, place, and time.  Skin: Skin is warm and dry. No rash noted.  Psychiatric: She has a normal mood and affect. Her behavior is normal.          Assessment & Plan:   Essential hypertension well-controlled ADHD.  Stable on present regimen.  Adderall refilled Weight gain.  Follow-up 6 months  Nyoka Cowden

## 2018-02-18 NOTE — Patient Instructions (Signed)
Limit your sodium (Salt) intake    It is important that you exercise regularly, at least 20 minutes 3 to 4 times per week.  If you develop chest pain or shortness of breath seek  medical attention.  You need to lose weight.  Consider a lower calorie diet and regular exercise.  Please check your blood pressure on a regular basis.  If it is consistently greater than 140/90, please make an office appointment.   Return in 6 months for follow-up

## 2018-02-19 ENCOUNTER — Telehealth: Payer: Self-pay

## 2018-02-19 NOTE — Telephone Encounter (Signed)
Pt was seen 02/18/18 for OV and given rx for Adderall. Both rx's had "Fill In One Month" in the script. She states that Dr. Raliegh Ip had "scratched out" one of them. Since the rx had been altered the pharmacy refused to accept/fill it. She brought the rx back to the office today and asked to have a new one. She is aware Dr. Raliegh Ip is out of the office.  Spoke with Dr. Sarajane Jews and showed him rx. He agrees to send in one rx until Dr. Raliegh Ip returns.   Pt asked for rx to be sent to [facility] in her chart.   Dr. Raliegh Ip - [facility]. Thanks! Dr. Sarajane Jews - Please send rx for one month supply. Thanks!

## 2018-02-20 MED ORDER — AMPHETAMINE-DEXTROAMPHETAMINE 20 MG PO TABS: 20.0000 mg | ORAL_TABLET | Freq: Two times a day (BID) | ORAL | 0 refills | Status: DC

## 2018-02-20 NOTE — Telephone Encounter (Signed)
I sent in a 30 day supply

## 2018-02-25 ENCOUNTER — Telehealth: Payer: Self-pay | Admitting: Family Medicine

## 2018-02-25 NOTE — Telephone Encounter (Signed)
Copied from [place] [id number]. Topic: General - Other >> Feb 25, 2018  3:06 PM Carolyn Stare wrote: Pt call to ask if she can increase the below meds  or if she can try something else. She said she just going thru something and really need something . Would like a call back     citalopram (CELEXA) 20 MG tablet             LORazepam (ATIVAN) 0.5 MG tablet

## 2018-02-26 NOTE — Telephone Encounter (Signed)
Left detailed message and encourage patient  to return call.

## 2018-02-26 NOTE — Telephone Encounter (Signed)
Increase citalopram to 40 mg daily Okay to take alprazolam more frequently as needed  Encourage patient to make an appointment with behavioral health and provide contact number
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
## 2018-03-05 NOTE — Telephone Encounter (Signed)
Spoke to patient and she stated that she is doing much better now. Patient has not increase her medication or has called behavioral health as of 03/04/2018 due to pt just hearing message left on answering machine 02/26/2018. Patient stated that she will try the increase and see what behavorial health will offer for help.

## 2018-03-09 DIAGNOSIS — M5440 Lumbago with sciatica, unspecified side: Secondary | ICD-10-CM | POA: Diagnosis not present

## 2018-03-09 DIAGNOSIS — M9902 Segmental and somatic dysfunction of thoracic region: Secondary | ICD-10-CM | POA: Diagnosis not present

## 2018-03-09 DIAGNOSIS — M62830 Muscle spasm of back: Secondary | ICD-10-CM | POA: Diagnosis not present

## 2018-03-09 DIAGNOSIS — M9903 Segmental and somatic dysfunction of lumbar region: Secondary | ICD-10-CM | POA: Diagnosis not present

## 2018-03-11 DIAGNOSIS — M9903 Segmental and somatic dysfunction of lumbar region: Secondary | ICD-10-CM | POA: Diagnosis not present

## 2018-03-11 DIAGNOSIS — M62830 Muscle spasm of back: Secondary | ICD-10-CM | POA: Diagnosis not present

## 2018-03-11 DIAGNOSIS — M9902 Segmental and somatic dysfunction of thoracic region: Secondary | ICD-10-CM | POA: Diagnosis not present

## 2018-03-11 DIAGNOSIS — M5440 Lumbago with sciatica, unspecified side: Secondary | ICD-10-CM | POA: Diagnosis not present

## 2018-03-12 DIAGNOSIS — M62830 Muscle spasm of back: Secondary | ICD-10-CM | POA: Diagnosis not present

## 2018-03-12 DIAGNOSIS — M9902 Segmental and somatic dysfunction of thoracic region: Secondary | ICD-10-CM | POA: Diagnosis not present

## 2018-03-12 DIAGNOSIS — M9903 Segmental and somatic dysfunction of lumbar region: Secondary | ICD-10-CM | POA: Diagnosis not present

## 2018-03-12 DIAGNOSIS — M5440 Lumbago with sciatica, unspecified side: Secondary | ICD-10-CM | POA: Diagnosis not present

## 2018-05-08 ENCOUNTER — Telehealth: Payer: Self-pay | Admitting: *Deleted

## 2018-05-08 NOTE — Telephone Encounter (Signed)
Copied from [place] [id number]. Topic: General - Other >> May 07, 2018  4:47 PM Rutherford Nail, NT wrote: Reason for CRM: patient calling to see about getting an MMR shot. Please advise. CB#: [phone]

## 2018-05-08 NOTE — Telephone Encounter (Signed)
If patient is requesting MMR booster or lab work, if any of the following questions are answered YES then no booster is needed. If all answers are NO then an office visit will need to be scheduled to discuss plan.  1. Were you born before 1958? NO 2. For those patients considered high risk (college students, healthcare workers, international travelers, and groups at increased risk during outbreaks) do you have documentation of 2 doses of measles and mumps virus - containing vaccine? YES, traveling to areas with possible exposure 3. For all other patients (those not considered high risk) do you have documentation of one dose of measles and mumps virus - containing vaccine?  Unsure 4. Do you have laboratory confirmation of past infection or had blood tests (titer) that show you are immune to measles, mumps, and rubella? Unsure   Please verify answers to 4 questions above.  Will send to Dr K once verified to advise if vaccine needed.  LM for patient -- Will await call back from patient.  

## 2018-05-13 NOTE — Telephone Encounter (Signed)
Okay for MMR 

## 2018-05-13 NOTE — Telephone Encounter (Signed)
Patient answered these questions  Does pt needs booster? Please advise

## 2018-05-15 NOTE — Telephone Encounter (Signed)
Noted  

## 2018-05-15 NOTE — Telephone Encounter (Signed)
Left detailed message informing pt of update. 

## 2018-05-22 ENCOUNTER — Other Ambulatory Visit: Payer: Self-pay

## 2018-05-22 ENCOUNTER — Telehealth: Payer: Self-pay | Admitting: Internal Medicine

## 2018-05-22 MED ORDER — HYDROCHLOROTHIAZIDE 12.5 MG PO CAPS: 12.5000 mg | ORAL_CAPSULE | Freq: Every day | ORAL | 0 refills | Status: DC

## 2018-05-22 MED ORDER — VERAPAMIL HCL ER 240 MG PO TBCR: 240.0000 mg | EXTENDED_RELEASE_TABLET | Freq: Every day | ORAL | 0 refills | Status: DC

## 2018-05-22 NOTE — Telephone Encounter (Signed)
Copied from [place] [id number]. Topic: Quick Communication - See Telephone Encounter >> May 22, 2018 12:32 PM Mylinda Latina, NT wrote: CRM for notification. See Telephone encounter for: 05/22/18. Patient called and states she needs a refill of her   hydrochlorothiazide (MICROZIDE) 12.5 MG capsule she would like a 90 day supply, verapamil (CALAN-SR) 240 MG CR tablet she would like a 90 day supply as well   [facility] # [place], [place] [phone] (Phone) [phone] (Fax)

## 2018-05-27 ENCOUNTER — Ambulatory Visit: Payer: BLUE CROSS/BLUE SHIELD

## 2018-05-29 ENCOUNTER — Ambulatory Visit (INDEPENDENT_AMBULATORY_CARE_PROVIDER_SITE_OTHER): Payer: BLUE CROSS/BLUE SHIELD

## 2018-05-29 DIAGNOSIS — Z23 Encounter for immunization: Secondary | ICD-10-CM

## 2018-05-29 NOTE — Progress Notes (Signed)
Per orders of Dr. Kwiatkowski, injection of MMR given by Ramon Zanders. Patient tolerated injection well.  

## 2018-06-04 ENCOUNTER — Encounter: Payer: Self-pay | Admitting: Family Medicine

## 2018-06-04 ENCOUNTER — Telehealth: Payer: Self-pay

## 2018-06-04 ENCOUNTER — Ambulatory Visit (INDEPENDENT_AMBULATORY_CARE_PROVIDER_SITE_OTHER): Payer: BLUE CROSS/BLUE SHIELD | Admitting: Family Medicine

## 2018-06-04 VITALS — BP 118/78 | HR 70 | Temp 98.4°F | Ht 68.0 in | Wt 242.0 lb

## 2018-06-04 DIAGNOSIS — F419 Anxiety disorder, unspecified: Secondary | ICD-10-CM

## 2018-06-04 DIAGNOSIS — I10 Essential (primary) hypertension: Secondary | ICD-10-CM | POA: Diagnosis not present

## 2018-06-04 NOTE — Patient Instructions (Signed)

## 2018-06-04 NOTE — Telephone Encounter (Signed)
Pt last refill for Lorazepam was refilled on 02/18/2018, Refill for 30 tablets with 1 refill, Pt requesting refill sent to her pharmacy. Please Advise
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
## 2018-06-08 MED ORDER — LORAZEPAM 0.5 MG PO TABS: 0.5000 mg | ORAL_TABLET | Freq: Two times a day (BID) | ORAL | 0 refills | Status: DC | PRN

## 2018-06-08 NOTE — Addendum Note (Signed)
Addended by: Agnes Lawrence on: 06/08/2018 01:19 PM   Modules accepted: Orders

## 2018-06-08 NOTE — Telephone Encounter (Signed)
Rx done. 

## 2018-06-08 NOTE — Telephone Encounter (Signed)
Okay for refill?  

## 2018-08-03 ENCOUNTER — Other Ambulatory Visit: Payer: Self-pay | Admitting: Internal Medicine

## 2018-08-04 NOTE — Telephone Encounter (Signed)
Dr.Banks pt 

## 2018-08-06 NOTE — Telephone Encounter (Signed)
Pt last OV was on 06/04/2018 and last refill was done on 02/18/2018 for 60 tablets, please advise

## 2018-08-07 DIAGNOSIS — D2372 Other benign neoplasm of skin of left lower limb, including hip: Secondary | ICD-10-CM | POA: Diagnosis not present

## 2018-08-07 DIAGNOSIS — D2371 Other benign neoplasm of skin of right lower limb, including hip: Secondary | ICD-10-CM | POA: Diagnosis not present

## 2018-08-07 DIAGNOSIS — Z85820 Personal history of malignant melanoma of skin: Secondary | ICD-10-CM | POA: Diagnosis not present

## 2018-08-07 DIAGNOSIS — Z85828 Personal history of other malignant neoplasm of skin: Secondary | ICD-10-CM | POA: Diagnosis not present

## 2018-08-07 DIAGNOSIS — L570 Actinic keratosis: Secondary | ICD-10-CM | POA: Diagnosis not present

## 2018-09-01 ENCOUNTER — Other Ambulatory Visit: Payer: Self-pay | Admitting: Internal Medicine

## 2018-09-01 NOTE — Telephone Encounter (Signed)
Dr.Banks pt 

## 2018-09-08 ENCOUNTER — Other Ambulatory Visit: Payer: Self-pay | Admitting: Family Medicine

## 2018-09-08 NOTE — Telephone Encounter (Signed)
Pt LOV was on 06/04/2018 and last refill was done on  08/07/2018 for 60 tablets no refills. Please advise

## 2018-09-24 ENCOUNTER — Other Ambulatory Visit: Payer: Self-pay | Admitting: Internal Medicine

## 2018-09-29 ENCOUNTER — Other Ambulatory Visit: Payer: Self-pay | Admitting: Gynecology

## 2018-09-29 DIAGNOSIS — Z1231 Encounter for screening mammogram for malignant neoplasm of breast: Secondary | ICD-10-CM

## 2018-10-23 ENCOUNTER — Encounter: Payer: Self-pay | Admitting: Internal Medicine

## 2018-10-23 ENCOUNTER — Ambulatory Visit (INDEPENDENT_AMBULATORY_CARE_PROVIDER_SITE_OTHER): Payer: BLUE CROSS/BLUE SHIELD | Admitting: Internal Medicine

## 2018-10-23 VITALS — BP 130/90 | HR 89 | Temp 99.0°F | Wt 244.1 lb

## 2018-10-23 DIAGNOSIS — F900 Attention-deficit hyperactivity disorder, predominantly inattentive type: Secondary | ICD-10-CM

## 2018-10-23 DIAGNOSIS — F339 Major depressive disorder, recurrent, unspecified: Secondary | ICD-10-CM

## 2018-10-23 DIAGNOSIS — I10 Essential (primary) hypertension: Secondary | ICD-10-CM

## 2018-10-23 MED ORDER — CITALOPRAM HYDROBROMIDE 20 MG PO TABS: 20.0000 mg | ORAL_TABLET | Freq: Every day | ORAL | 3 refills | Status: DC

## 2018-10-23 MED ORDER — AMPHETAMINE-DEXTROAMPHETAMINE 20 MG PO TABS: 20.0000 mg | ORAL_TABLET | Freq: Two times a day (BID) | ORAL | 0 refills | Status: DC

## 2018-10-23 NOTE — Progress Notes (Signed)
Established Patient Office Visit     CC/Reason for Visit: Establish care, medication refills, follow-up on chronic medical conditions  HPI: Misty Benson is a 57 y.o. female who is coming in today for the above mentioned reasons. Past Medical History is significant for: Depression on Celexa with stable mood, ADHD on Adderall for many years prescribed through this clinic, hypertension that has been well controlled on verapamil and hydrochlorothiazide, obesity.  She has no acute complaints on today's visit.  She would like refills of her Celexa and Adderall.  She also mentions that she needs to schedule her annual wellness visit.   Past Medical/Surgical History: Past Medical History:  Diagnosis Date  . ADHD (attention deficit hyperactivity disorder)   . Anxiety   . Cancer ([place])    BASAL CELL FACE AND SHOULDERS , SQUAMOS CELL ON HAND AND MELANOMA LEFT ARM.Marland Kitchen.  . Cervix cancer ([place]) 1987   microinvasive  . Hypertension   . Leiomyoma 2013   multiple small  . Melanoma ([place])   . Obesity     Past Surgical History:  Procedure Laterality Date  . arm surgery     to remove melanoma on left upper outter arm  . BUNIONECTOMY     LEFT FOOT WITH PINS  . CERVICAL CONE BIOPSY  1987  . CESAREAN SECTION    . DILATION AND CURETTAGE OF UTERUS     x2  . FOOT SURGERY    . MELANOMA EXCISION    . TONSILLECTOMY     as a child    Social History:  reports that she has never smoked. She has never used smokeless tobacco. She reports current alcohol use of about 4.0 standard drinks of alcohol per week. She reports that she does not use drugs.  Allergies: No Known Allergies  Family History:  Family History  Problem Relation Age of Onset  . Stroke Mother   . Osteoporosis Mother   . Hyperlipidemia Father   . Atrial fibrillation Father   . Cancer Father        Prostate  . Breast cancer Maternal Grandmother 35     Current Outpatient Medications:  .  acetaminophen (TYLENOL) 500 MG  tablet, Take 1,000 mg by mouth every 6 (six) hours as needed for mild pain, moderate pain, fever or headache., Disp: , Rfl:  .  albuterol (PROVENTIL HFA;VENTOLIN HFA) 108 (90 Base) MCG/ACT inhaler, Inhale 1-2 puffs into the lungs every 6 (six) hours as needed for wheezing or shortness of breath., Disp: 1 Inhaler, Rfl: 0 .  amphetamine-dextroamphetamine (ADDERALL) 20 MG tablet, Take 1 tablet (20 mg total) by mouth 2 (two) times daily., Disp: 60 tablet, Rfl: 0 .  amphetamine-dextroamphetamine (ADDERALL) 20 MG tablet, Take 1 tablet (20 mg total) by mouth 2 (two) times daily., Disp: 60 tablet, Rfl: 0 .  citalopram (CELEXA) 20 MG tablet, Take 1 tablet (20 mg total) by mouth daily., Disp: 90 tablet, Rfl: 3 .  hydrochlorothiazide (MICROZIDE) 12.5 MG capsule, Take 1 capsule by mouth once daily, Disp: 90 capsule, Rfl: 0 .  ibuprofen (ADVIL,MOTRIN) 200 MG tablet, Take 3-4 tablets (600-800 mg total) by mouth every 6 (six) hours as needed for fever, headache, mild pain, moderate pain or cramping., Disp: 30 tablet, Rfl: 6 .  LORazepam (ATIVAN) 0.5 MG tablet, Take 1 tablet (0.5 mg total) by mouth 2 (two) times daily as needed for anxiety., Disp: 30 tablet, Rfl: 0 .  verapamil (CALAN-SR) 240 MG CR tablet, take 1 tablet by mouth  daily, Disp: 90 tablet, Rfl: 0 .  zolpidem (AMBIEN) 10 MG tablet, Take 0.5-1 tablets (5-10 mg total) by mouth at bedtime as needed for sleep., Disp: 30 tablet, Rfl: 3 .  amphetamine-dextroamphetamine (ADDERALL) 20 MG tablet, Take 1 tablet (20 mg total) by mouth 2 (two) times daily., Disp: 60 tablet, Rfl: 0  Review of Systems:  Constitutional: Denies fever, chills, diaphoresis, appetite change and fatigue.  HEENT: Denies photophobia, eye pain, redness, hearing loss, ear pain, congestion, sore throat, rhinorrhea, sneezing, mouth sores, trouble swallowing, neck pain, neck stiffness and tinnitus.   Respiratory: Denies SOB, DOE, cough, chest tightness,  and wheezing.   Cardiovascular: Denies  chest pain, palpitations and leg swelling.  Gastrointestinal: Denies nausea, vomiting, abdominal pain, diarrhea, constipation, blood in stool and abdominal distention.  Genitourinary: Denies dysuria, urgency, frequency, hematuria, flank pain and difficulty urinating.  Endocrine: Denies: hot or cold intolerance, sweats, changes in hair or nails, polyuria, polydipsia. Musculoskeletal: Denies myalgias, back pain, joint swelling, arthralgias and gait problem.  Skin: Denies pallor, rash and wound.  Neurological: Denies dizziness, seizures, syncope, weakness, light-headedness, numbness and headaches.  Hematological: Denies adenopathy. Easy bruising, personal or family bleeding history  Psychiatric/Behavioral: Denies suicidal ideation, mood changes, confusion, nervousness, sleep disturbance and agitation    Physical Exam: Vitals:   10/23/18 0813  BP: 130/90  Pulse: 89  Temp: 99 F (37.2 C)  TempSrc: Oral  SpO2: 98%  Weight: 244 lb 1.6 oz (110.7 kg)    Body mass index is 37.12 kg/m.   Constitutional: NAD, calm, comfortable Eyes: PERRL, lids and conjunctivae normal ENMT: Mucous membranes are moist.  Neck: normal, supple, no masses, no thyromegaly Respiratory: clear to auscultation bilaterally, no wheezing, no crackles. Normal respiratory effort. No accessory muscle use.  Cardiovascular: Regular rate and rhythm, no murmurs / rubs / gallops. No extremity edema. 2+ pedal pulses. No carotid bruits.  Abdomen: no tenderness, no masses palpated. No hepatosplenomegaly. Bowel sounds positive.  Musculoskeletal: no clubbing / cyanosis. No joint deformity upper and lower extremities. Good ROM, no contractures. Normal muscle tone.  Skin: no rashes, lesions, ulcers. No induration Neurologic: Grossly intact and nonfocal Psychiatric: Normal judgment and insight. Alert and oriented x 3. Normal mood.    Impression and Plan:  Attention deficit hyperactivity disorder (ADHD), predominantly inattentive  type  -No issues well on Adderall, has noticed in the past when she has come off Adderall for short periods of time she has severe difficulty concentrating on simple tasks. -Adderall has been refilled today.  Morbid obesity ([place]) -Discussed lifestyle modifications including healthier food choices and increased physical activity.  Essential hypertension -Not well controlled on today's visit, patient believes this has to do with anxiety over meeting a new provider and driving on the ICU rounds. -She has been advised to continue ambulatory blood pressure monitoring and to bring log in at next visit to ensure no changes to her antihypertensive regimen need to be made.  Depression, recurrent ([place]) -Mood is stable, continue Celexa.    Patient Instructions  -It was nice meeting you today!  -Please schedule follow up in 3-4 months for your annual wellness visit. Please come fasting to that visit.     Lelon Frohlich, MD [place] [facility]

## 2018-10-23 NOTE — Patient Instructions (Signed)
-  It was nice meeting you today!  -Please schedule follow up in 3-4 months for your annual wellness visit. Please come fasting to that visit.

## 2018-11-25 ENCOUNTER — Ambulatory Visit
Admission: RE | Admit: 2018-11-25 | Discharge: 2018-11-25 | Disposition: A | Discharge status: Home / Self Care | Payer: BLUE CROSS/BLUE SHIELD | Source: Ambulatory Visit | Attending: Gynecology | Admitting: Gynecology

## 2018-11-25 DIAGNOSIS — Z1231 Encounter for screening mammogram for malignant neoplasm of breast: Secondary | ICD-10-CM | POA: Diagnosis not present

## 2018-12-04 ENCOUNTER — Other Ambulatory Visit: Payer: Self-pay | Admitting: Family Medicine

## 2018-12-04 ENCOUNTER — Other Ambulatory Visit: Payer: Self-pay | Admitting: Internal Medicine

## 2018-12-04 NOTE — Telephone Encounter (Signed)
This is Dr Jerilee Hoh pt

## 2018-12-15 ENCOUNTER — Telehealth: Payer: Self-pay | Admitting: Internal Medicine

## 2018-12-15 NOTE — Telephone Encounter (Signed)
Medication not delegated for NT to refill. 

## 2018-12-15 NOTE — Telephone Encounter (Signed)
She was given a 3 mo Rx on 12/13. Should not need refills until 3/13

## 2018-12-15 NOTE — Telephone Encounter (Signed)
Copied from [place] [id number]. Topic: Quick Communication - Rx Refill/Question >> Dec 15, 2018 11:09 AM Leward Quan A wrote: Medication: amphetamine-dextroamphetamine (ADDERALL) 20 MG tablet  Has the patient contacted their pharmacy? Yes.   (Agent: If no, request that the patient contact the pharmacy for the refill.) (Agent: If yes, when and what did the pharmacy advise?)  Preferred Pharmacy (with phone number or street name): [facility] # [id number], [place] [phone] (Phone) [phone] (Fax)    Agent: Please be advised that RX refills may take up to 3 business days. We ask that you follow-up with your pharmacy.

## 2018-12-22 ENCOUNTER — Encounter: Payer: BLUE CROSS/BLUE SHIELD | Admitting: Gynecology

## 2019-01-21 ENCOUNTER — Encounter: Payer: BLUE CROSS/BLUE SHIELD | Admitting: Gynecology

## 2019-01-21 ENCOUNTER — Other Ambulatory Visit: Payer: Self-pay

## 2019-01-21 ENCOUNTER — Encounter: Payer: Self-pay | Admitting: Gynecology

## 2019-01-21 ENCOUNTER — Ambulatory Visit (INDEPENDENT_AMBULATORY_CARE_PROVIDER_SITE_OTHER): Payer: BLUE CROSS/BLUE SHIELD | Admitting: Gynecology

## 2019-01-21 VITALS — BP 124/76 | Ht 68.0 in | Wt 243.0 lb

## 2019-01-21 DIAGNOSIS — R635 Abnormal weight gain: Secondary | ICD-10-CM | POA: Diagnosis not present

## 2019-01-21 DIAGNOSIS — Z01419 Encounter for gynecological examination (general) (routine) without abnormal findings: Secondary | ICD-10-CM | POA: Diagnosis not present

## 2019-01-21 DIAGNOSIS — Z13220 Encounter for screening for lipoid disorders: Secondary | ICD-10-CM

## 2019-01-21 DIAGNOSIS — E559 Vitamin D deficiency, unspecified: Secondary | ICD-10-CM | POA: Diagnosis not present

## 2019-01-21 DIAGNOSIS — N952 Postmenopausal atrophic vaginitis: Secondary | ICD-10-CM

## 2019-01-21 NOTE — Addendum Note (Signed)
Addended by: Nelva Nay on: 01/21/2019 12:58 PM   Modules accepted: Orders

## 2019-01-21 NOTE — Progress Notes (Signed)
    Misty Benson [birth year] [id number]        58 y.o.  [id number] for annual gynecologic exam.  Without gynecologic complaints.  Several issues noted below.  Past medical history,surgical history, problem list, medications, allergies, family history and social history were all reviewed and documented as reviewed in the EPIC chart.  ROS:  Performed with pertinent positives and negatives included in the history, assessment and plan.   Additional significant findings : None   Exam: [name] assistant Vitals:   01/21/19 1114  BP: 124/76  Weight: 243 lb (110.2 kg)  Height: 5\' 8"  (1.727 m)   Body mass index is 36.95 kg/m.  General appearance:  Normal affect, orientation and appearance. Skin: Grossly normal HEENT: Without gross lesions.  No cervical or supraclavicular adenopathy. Thyroid normal.  Lungs:  Clear without wheezing, rales or rhonchi Cardiac: RR, without RMG Abdominal:  Soft, nontender, without masses, guarding, rebound, organomegaly or hernia Breasts:  Examined lying and sitting without masses, retractions, discharge or axillary adenopathy. Pelvic:  Ext, BUS, Vagina: With atrophic changes.  Mild rectocele noted  Cervix: With atrophic changes.  Pap smear done  Uterus: Anteverted, normal size, shape and contour, midline and mobile nontender   Adnexa: Without masses or tenderness    Anus and perineum: Normal   Rectovaginal: Normal sphincter tone without palpated masses or tenderness.    Assessment/Plan:  58 y.o. [id number] female for annual gynecologic exam.   1. Postmenopausal.  No significant menopausal symptoms or any bleeding. 2. Mild rectocele.  Asymptomatic to the patient.  Stable on exam. 3. History of vitamin D deficiency.  Check vitamin D level today. 4. History of weight gain.  She has lost a fair amount of weight but then regained it.  Will check baseline TSH to make sure this is not thyroid related. 5. DEXA never.  Will plan at age 64. 32. Mammography 11/2018.   Annual mammography next year when due.  Breast exam normal today. 7. Pap smear 2017.  Pap smear done today.  History of microinvasive carcinoma of the cervix status post cone biopsy 1987 with negative Pap smears since. 8. Colonoscopy 2011.  Repeat at their recommended interval. 9. Health maintenance.  Requests baseline labs.  CBC, CMP and lipid profile ordered along with her vitamin D level due to her vitamin D deficiency in the past and her TSH due to her weight gain.  Follow-up 1 year, sooner as needed.   Anastasio Auerbach MD, 12:07 PM 01/21/2019

## 2019-01-21 NOTE — Patient Instructions (Signed)
Follow-up in 1 year for annual exam, sooner if any issues. 

## 2019-01-22 LAB — PAP IG W/ RFLX HPV ASCU

## 2019-01-22 LAB — CBC WITH DIFFERENTIAL/PLATELET
Absolute Monocytes: 489 cells/uL (ref 200–950)
BASOS PCT: 1 %
Basophils Absolute: 52 cells/uL (ref 0–200)
Eosinophils Absolute: 78 cells/uL (ref 15–500)
Eosinophils Relative: 1.5 %
HCT: 41.4 % (ref 35.0–45.0)
HEMOGLOBIN: 13.8 g/dL (ref 11.7–15.5)
Lymphs Abs: 1362 cells/uL (ref 850–3900)
MCH: 30.1 pg (ref 27.0–33.0)
MCHC: 33.3 g/dL (ref 32.0–36.0)
MCV: 90.2 fL (ref 80.0–100.0)
MPV: 10.7 fL (ref 7.5–12.5)
Monocytes Relative: 9.4 %
NEUTROS ABS: 3219 {cells}/uL (ref 1500–7800)
Neutrophils Relative %: 61.9 %
Platelets: 306 10*3/uL (ref 140–400)
RBC: 4.59 10*6/uL (ref 3.80–5.10)
RDW: 12.3 % (ref 11.0–15.0)
Total Lymphocyte: 26.2 %
WBC: 5.2 10*3/uL (ref 3.8–10.8)

## 2019-01-22 LAB — COMPREHENSIVE METABOLIC PANEL
AG Ratio: 1.8 (calc) (ref 1.0–2.5)
ALKALINE PHOSPHATASE (APISO): 63 U/L (ref 37–153)
ALT: 20 U/L (ref 6–29)
AST: 17 U/L (ref 10–35)
Albumin: 4.4 g/dL (ref 3.6–5.1)
BUN: 19 mg/dL (ref 7–25)
CALCIUM: 10.1 mg/dL (ref 8.6–10.4)
CO2: 28 mmol/L (ref 20–32)
Chloride: 104 mmol/L (ref 98–110)
Creat: 0.86 mg/dL (ref 0.50–1.05)
Globulin: 2.5 g/dL (calc) (ref 1.9–3.7)
Glucose, Bld: 95 mg/dL (ref 65–99)
Potassium: 4.1 mmol/L (ref 3.5–5.3)
Sodium: 140 mmol/L (ref 135–146)
Total Bilirubin: 0.6 mg/dL (ref 0.2–1.2)
Total Protein: 6.9 g/dL (ref 6.1–8.1)

## 2019-01-22 LAB — LIPID PANEL
Cholesterol: 231 mg/dL — ABNORMAL HIGH (ref <200)
HDL: 89 mg/dL (ref >=50)
LDL CHOLESTEROL (CALC): 121 mg/dL — AB
Non-HDL Cholesterol (Calc): 142 mg/dL (calc) — ABNORMAL HIGH (ref <130)
Total CHOL/HDL Ratio: 2.6 (calc) (ref <5.0)
Triglycerides: 100 mg/dL (ref <150)

## 2019-01-22 LAB — VITAMIN D 25 HYDROXY (VIT D DEFICIENCY, FRACTURES): Vit D, 25-Hydroxy: 41 ng/mL (ref 30–100)

## 2019-01-22 LAB — TSH: TSH: 1.75 m[IU]/L (ref 0.40–4.50)

## 2019-01-27 ENCOUNTER — Ambulatory Visit (INDEPENDENT_AMBULATORY_CARE_PROVIDER_SITE_OTHER): Payer: BLUE CROSS/BLUE SHIELD | Admitting: Internal Medicine

## 2019-01-27 ENCOUNTER — Encounter: Payer: Self-pay | Admitting: Internal Medicine

## 2019-01-27 ENCOUNTER — Other Ambulatory Visit: Payer: Self-pay

## 2019-01-27 VITALS — BP 120/80 | HR 90 | Temp 98.4°F | Wt 245.6 lb

## 2019-01-27 DIAGNOSIS — F900 Attention-deficit hyperactivity disorder, predominantly inattentive type: Secondary | ICD-10-CM | POA: Diagnosis not present

## 2019-01-27 DIAGNOSIS — I10 Essential (primary) hypertension: Secondary | ICD-10-CM

## 2019-01-27 MED ORDER — AMPHETAMINE-DEXTROAMPHETAMINE 20 MG PO TABS: 20.0000 mg | ORAL_TABLET | Freq: Two times a day (BID) | ORAL | 0 refills | Status: DC

## 2019-01-27 MED ORDER — AMPHETAMINE-DEXTROAMPHETAMINE 20 MG PO TABS: 20.0000 mg | ORAL_TABLET | Freq: Every day | ORAL | 0 refills | Status: DC

## 2019-01-27 NOTE — Progress Notes (Signed)
Established Patient Office Visit     CC/Reason for Visit: 3 month f/u, medication refills  HPI: Misty Benson is a 58 y.o. female who is coming in today for the above mentioned reasons. Past Medical History is significant for anxiety, depression, hypertension, obseity, ADHD and cancer.  Patient came in today for her 3 month follow up and adderall refills.  Patient says her bp cuff at home doesn't work well, so she returned it.  She sts that she is going to pick up a new blood pressure machine, but hasn't gotton around to it yet.  Patient lives at home with her son and husband.  Her two daughters are in town right now, so they have a full house.  Patient would like an adderall refill today.  She has no concerns or complaints, and denies any changes.   Past Medical/Surgical History: Past Medical History:  Diagnosis Date  . ADHD (attention deficit hyperactivity disorder)   . Anxiety   . Cancer ([place])    BASAL CELL FACE AND SHOULDERS , SQUAMOS CELL ON HAND AND MELANOMA LEFT ARM.Marland Kitchen.  . Cervix cancer ([place]) 1987   microinvasive  . Hypertension   . Leiomyoma 2013   multiple small  . Melanoma ([place])   . Obesity     Past Surgical History:  Procedure Laterality Date  . arm surgery     to remove melanoma on left upper outter arm  . BUNIONECTOMY     LEFT FOOT WITH PINS  . CERVICAL CONE BIOPSY  1987  . CESAREAN SECTION    . DILATION AND CURETTAGE OF UTERUS     x2  . FOOT SURGERY    . MELANOMA EXCISION    . TONSILLECTOMY     as a child    Social History:  reports that she has never smoked. She has never used smokeless tobacco. She reports current alcohol use of about 4.0 standard drinks of alcohol per week. She reports that she does not use drugs.  Allergies: No Known Allergies  Family History:  Family History  Problem Relation Age of Onset  . Stroke Mother   . Osteoporosis Mother   . Hyperlipidemia Father   . Atrial fibrillation Father   . Cancer Father        Prostate   . Breast cancer Maternal Grandmother 54     Current Outpatient Medications:  .  acetaminophen (TYLENOL) 500 MG tablet, Take 1,000 mg by mouth every 6 (six) hours as needed for mild pain, moderate pain, fever or headache., Disp: , Rfl:  .  albuterol (PROVENTIL HFA;VENTOLIN HFA) 108 (90 Base) MCG/ACT inhaler, Inhale 1-2 puffs into the lungs every 6 (six) hours as needed for wheezing or shortness of breath., Disp: 1 Inhaler, Rfl: 0 .  amphetamine-dextroamphetamine (ADDERALL) 20 MG tablet, Take 1 tablet (20 mg total) by mouth 2 (two) times daily., Disp: 60 tablet, Rfl: 0 .  citalopram (CELEXA) 20 MG tablet, Take 1 tablet (20 mg total) by mouth daily., Disp: 90 tablet, Rfl: 3 .  hydrochlorothiazide (MICROZIDE) 12.5 MG capsule, TAKE ONE CAPSULE BY MOUTH ONE TIME DAILY , Disp: 90 capsule, Rfl: 0 .  ibuprofen (ADVIL,MOTRIN) 200 MG tablet, Take 3-4 tablets (600-800 mg total) by mouth every 6 (six) hours as needed for fever, headache, mild pain, moderate pain or cramping., Disp: 30 tablet, Rfl: 6 .  LORazepam (ATIVAN) 0.5 MG tablet, Take 1 tablet (0.5 mg total) by mouth 2 (two) times daily as needed for anxiety., Disp:  30 tablet, Rfl: 0 .  verapamil (CALAN-SR) 240 MG CR tablet, TAKE ONE TABLET BY MOUTH ONE TIME DAILY , Disp: 90 tablet, Rfl: 0 .  zolpidem (AMBIEN) 10 MG tablet, Take 0.5-1 tablets (5-10 mg total) by mouth at bedtime as needed for sleep., Disp: 30 tablet, Rfl: 3  Review of Systems:  Constitutional: Denies fever, chills, diaphoresis, appetite change and fatigue.  HEENT: Denies photophobia, eye pain, redness, hearing loss, ear pain, congestion, sore throat, rhinorrhea, sneezing, mouth sores, trouble swallowing, neck pain, neck stiffness and tinnitus.   Respiratory: Denies SOB, DOE, cough, chest tightness,  and wheezing.   Cardiovascular: Denies chest pain, palpitations and leg swelling.  Gastrointestinal: Denies nausea, vomiting, abdominal pain, diarrhea, constipation, blood in stool and  abdominal distention.  Genitourinary: Denies dysuria, urgency, frequency, hematuria, flank pain and difficulty urinating.  Endocrine: Denies: hot or cold intolerance, sweats, changes in hair or nails, polyuria, polydipsia. Musculoskeletal: Denies myalgias, back pain, joint swelling, arthralgias and gait problem.  Skin: Denies pallor, rash and wound.  Neurological: Denies dizziness, seizures, syncope, weakness, light-headedness, numbness and headaches.  Hematological: Denies adenopathy. Easy bruising, personal or family bleeding history  Psychiatric/Behavioral: Denies suicidal ideation, mood changes, confusion, nervousness, sleep disturbance and agitation  Physical Exam: Vitals:   01/27/19 1041  BP: 120/80  Pulse: 90  Temp: 98.4 F (36.9 C)  TempSrc: Oral  SpO2: 96%  Weight: 245 lb 9.6 oz (111.4 kg)    Body mass index is 37.34 kg/m.   Constitutional: NAD, calm, comfortable Eyes: PERRL, lids and conjunctivae normal Respiratory: clear to auscultation bilaterally, no wheezing, no crackles. Normal respiratory effort. No accessory muscle use.  Cardiovascular: Regular rate and rhythm, no murmurs / rubs / gallops. No extremity edema. 2+ pedal pulses.  Psychiatric: Normal judgment and insight. Alert and oriented x 3. Normal mood.    Impression and Plan:  Attention deficit hyperactivity disorder (ADHD), predominantly inattentive type -   -No changes. -Plan to refill adderall today for 3 months.  HTN -Well controlled today.     Patient Instructions  Great seeing you this morning.  -Adderal will be refilled today, see you in 3 months      Enzo Bi, RN DNP [facility]

## 2019-01-27 NOTE — Patient Instructions (Addendum)
Great seeing you this morning.  -Adderal will be refilled today, see you in 3 months

## 2019-02-23 ENCOUNTER — Other Ambulatory Visit: Payer: Self-pay | Admitting: Internal Medicine

## 2019-05-08 DIAGNOSIS — S80862A Insect bite (nonvenomous), left lower leg, initial encounter: Secondary | ICD-10-CM | POA: Diagnosis not present

## 2019-05-08 DIAGNOSIS — W57XXXA Bitten or stung by nonvenomous insect and other nonvenomous arthropods, initial encounter: Secondary | ICD-10-CM | POA: Diagnosis not present

## 2019-07-14 DIAGNOSIS — L820 Inflamed seborrheic keratosis: Secondary | ICD-10-CM | POA: Diagnosis not present

## 2019-07-14 DIAGNOSIS — C44319 Basal cell carcinoma of skin of other parts of face: Secondary | ICD-10-CM | POA: Diagnosis not present

## 2019-07-14 DIAGNOSIS — D2261 Melanocytic nevi of right upper limb, including shoulder: Secondary | ICD-10-CM | POA: Diagnosis not present

## 2019-07-14 DIAGNOSIS — Z85820 Personal history of malignant melanoma of skin: Secondary | ICD-10-CM | POA: Diagnosis not present

## 2019-07-14 DIAGNOSIS — L570 Actinic keratosis: Secondary | ICD-10-CM | POA: Diagnosis not present

## 2019-07-14 DIAGNOSIS — Z85828 Personal history of other malignant neoplasm of skin: Secondary | ICD-10-CM | POA: Diagnosis not present

## 2019-07-22 ENCOUNTER — Other Ambulatory Visit: Payer: Self-pay | Admitting: Internal Medicine

## 2019-07-22 DIAGNOSIS — F900 Attention-deficit hyperactivity disorder, predominantly inattentive type: Secondary | ICD-10-CM

## 2019-07-22 NOTE — Telephone Encounter (Signed)
Requested medication (s) are due for refill today: yes  Requested medication (s) are on the active medication list: yes  Last refill:  01/27/2019  Future visit scheduled: no  Notes to clinic:  This refill cannot be delegated   Requested Prescriptions  Pending Prescriptions Disp Refills   amphetamine-dextroamphetamine (ADDERALL) 20 MG tablet 60 tablet 0    Sig: Take 1 tablet (20 mg total) by mouth daily. Fill in 2 months     Not Delegated - Psychiatry:  Stimulants/ADHD Failed - 07/22/2019  2:50 PM      Failed - This refill cannot be delegated      Failed - Urine Drug Screen completed in last 360 days.      Failed - Valid encounter within last 3 months    Recent Outpatient Visits          5 months ago Attention deficit hyperactivity disorder (ADHD), predominantly inattentive type   [facility], Rayford Halsted, MD   9 months ago Attention deficit hyperactivity disorder (ADHD), predominantly inattentive type   [facility], Rayford Halsted, MD   1 year ago [place] [facility], [place] Adie, MD   1 year ago Essential hypertension   [place], Doretha Sou, MD   1 year ago Encounter for preventive health examination   [facility], Doretha Sou, MD

## 2019-07-22 NOTE — Telephone Encounter (Signed)
Medication Refill - Medication: amphetamine-dextroamphetamine (ADDERALL) 20 MG tablet   Has the patient contacted their pharmacy? No. Pt is requesting to have this medication for 90 days instead of 30 days. Please advise.  (Agent: If no, request that the patient contact the pharmacy for the refill.) (Agent: If yes, when and what did the pharmacy advise?)  Preferred Pharmacy (with phone number or street name):  [facility] # [id number] [place]  [address] [place]le[place] [address]  Phone: [phone] Fax: [phone]  Not a 24 hour pharmacy; exact hours not known.     Agent: Please be advised that RX refills may take up to 3 business days. We ask that you follow-up with your pharmacy.

## 2019-07-30 ENCOUNTER — Other Ambulatory Visit: Payer: Self-pay

## 2019-07-30 ENCOUNTER — Telehealth (INDEPENDENT_AMBULATORY_CARE_PROVIDER_SITE_OTHER): Payer: BC Managed Care – PPO | Admitting: Internal Medicine

## 2019-07-30 DIAGNOSIS — F339 Major depressive disorder, recurrent, unspecified: Secondary | ICD-10-CM | POA: Diagnosis not present

## 2019-07-30 DIAGNOSIS — F900 Attention-deficit hyperactivity disorder, predominantly inattentive type: Secondary | ICD-10-CM | POA: Diagnosis not present

## 2019-07-30 DIAGNOSIS — I10 Essential (primary) hypertension: Secondary | ICD-10-CM | POA: Diagnosis not present

## 2019-07-30 MED ORDER — CITALOPRAM HYDROBROMIDE 20 MG PO TABS: 20.0000 mg | ORAL_TABLET | Freq: Every day | ORAL | 1 refills | Status: DC

## 2019-07-30 MED ORDER — VERAPAMIL HCL ER 240 MG PO TBCR: 240.0000 mg | EXTENDED_RELEASE_TABLET | Freq: Every day | ORAL | 1 refills | Status: DC

## 2019-07-30 MED ORDER — HYDROCHLOROTHIAZIDE 12.5 MG PO CAPS: 12.5000 mg | ORAL_CAPSULE | Freq: Every day | ORAL | 1 refills | Status: DC

## 2019-07-30 NOTE — Progress Notes (Signed)
Virtual Visit via Video Note  I connected with Misty Benson on 07/30/19 at  2:00 PM EDT by a video enabled telemedicine application and verified that I am speaking with the correct person using two identifiers.  Location patient: home Location provider: work office Persons participating in the virtual visit: patient, provider  I discussed the limitations of evaluation and management by telemedicine and the availability of in person appointments. The patient expressed understanding and agreed to proceed.   HPI: She initially scheduled this visit for 4-month refills of her Adderall.  She does have a history of ADHD.  She has been out of the medication for a week and is now reconsidering if she can come completely off the medication.  She would like to try and do without the Adderall right now.  Nothing has changed since we last spoke.  Her 2 college aged children are now living at home again due to the COVID-19 pandemic.  She would like refills of her Celexa, verapamil and hydrochlorothiazide.   ROS: Constitutional: Denies fever, chills, diaphoresis, appetite change and fatigue.  HEENT: Denies photophobia, eye pain, redness, hearing loss, ear pain, congestion, sore throat, rhinorrhea, sneezing, mouth sores, trouble swallowing, neck pain, neck stiffness and tinnitus.   Respiratory: Denies SOB, DOE, cough, chest tightness,  and wheezing.   Cardiovascular: Denies chest pain, palpitations and leg swelling.  Gastrointestinal: Denies nausea, vomiting, abdominal pain, diarrhea, constipation, blood in stool and abdominal distention.  Genitourinary: Denies dysuria, urgency, frequency, hematuria, flank pain and difficulty urinating.  Endocrine: Denies: hot or cold intolerance, sweats, changes in hair or nails, polyuria, polydipsia. Musculoskeletal: Denies myalgias, back pain, joint swelling, arthralgias and gait problem.  Skin: Denies pallor, rash and wound.  Neurological: Denies dizziness,  seizures, syncope, weakness, light-headedness, numbness and headaches.  Hematological: Denies adenopathy. Easy bruising, personal or family bleeding history  Psychiatric/Behavioral: Denies suicidal ideation, mood changes, confusion, nervousness, sleep disturbance and agitation   Past Medical History:  Diagnosis Date  . ADHD (attention deficit hyperactivity disorder)   . Anxiety   . Cancer ([place])    BASAL CELL FACE AND SHOULDERS , SQUAMOS CELL ON HAND AND MELANOMA LEFT ARM.Marland Kitchen.  . Cervix cancer ([place]) 1987   microinvasive  . Hypertension   . Leiomyoma 2013   multiple small  . Melanoma ([place])   . Obesity     Past Surgical History:  Procedure Laterality Date  . arm surgery     to remove melanoma on left upper outter arm  . BUNIONECTOMY     LEFT FOOT WITH PINS  . CERVICAL CONE BIOPSY  1987  . CESAREAN SECTION    . DILATION AND CURETTAGE OF UTERUS     x2  . FOOT SURGERY    . MELANOMA EXCISION    . TONSILLECTOMY     as a child    Family History  Problem Relation Age of Onset  . Stroke Mother   . Osteoporosis Mother   . Hyperlipidemia Father   . Atrial fibrillation Father   . Cancer Father        Prostate  . Breast cancer Maternal Grandmother 91    SOCIAL HX:   reports that she has never smoked. She has never used smokeless tobacco. She reports current alcohol use of about 4.0 standard drinks of alcohol per week. She reports that she does not use drugs.   Current Outpatient Medications:  .  acetaminophen (TYLENOL) 500 MG tablet, Take 1,000 mg by mouth every 6 (six)  hours as needed for mild pain, moderate pain, fever or headache., Disp: , Rfl:  .  albuterol (PROVENTIL HFA;VENTOLIN HFA) 108 (90 Base) MCG/ACT inhaler, Inhale 1-2 puffs into the lungs every 6 (six) hours as needed for wheezing or shortness of breath., Disp: 1 Inhaler, Rfl: 0 .  amphetamine-dextroamphetamine (ADDERALL) 20 MG tablet, Take 1 tablet (20 mg total) by mouth 2 (two) times daily., Disp: 60 tablet, Rfl: 0  .  amphetamine-dextroamphetamine (ADDERALL) 20 MG tablet, Take 1 tablet (20 mg total) by mouth daily. Fill in 2 months, Disp: 60 tablet, Rfl: 0 .  amphetamine-dextroamphetamine (ADDERALL) 20 MG tablet, Take 1 tablet (20 mg total) by mouth 2 (two) times daily., Disp: 60 tablet, Rfl: 0 .  citalopram (CELEXA) 20 MG tablet, Take 1 tablet (20 mg total) by mouth daily., Disp: 90 tablet, Rfl: 1 .  hydrochlorothiazide (MICROZIDE) 12.5 MG capsule, Take 1 capsule (12.5 mg total) by mouth daily., Disp: 90 capsule, Rfl: 1 .  ibuprofen (ADVIL,MOTRIN) 200 MG tablet, Take 3-4 tablets (600-800 mg total) by mouth every 6 (six) hours as needed for fever, headache, mild pain, moderate pain or cramping., Disp: 30 tablet, Rfl: 6 .  LORazepam (ATIVAN) 0.5 MG tablet, Take 1 tablet (0.5 mg total) by mouth 2 (two) times daily as needed for anxiety., Disp: 30 tablet, Rfl: 0 .  verapamil (CALAN-SR) 240 MG CR tablet, Take 1 tablet (240 mg total) by mouth daily., Disp: 90 tablet, Rfl: 1 .  zolpidem (AMBIEN) 10 MG tablet, Take 0.5-1 tablets (5-10 mg total) by mouth at bedtime as needed for sleep., Disp: 30 tablet, Rfl: 3  EXAM:   VITALS per patient if applicable: None reported  GENERAL: alert, oriented, appears well and in no acute distress  HEENT: atraumatic, conjunttiva clear, no obvious abnormalities on inspection of external nose and ears  NECK: normal movements of the head and neck  LUNGS: on inspection no signs of respiratory distress, breathing rate appears normal, no obvious gross increased work of breathing, gasping or wheezing  CV: no obvious cyanosis  MS: moves all visible extremities without noticeable abnormality  PSYCH/NEURO: pleasant and cooperative, no obvious depression or anxiety, speech and thought processing grossly intact  ASSESSMENT AND PLAN:   Essential hypertension  -Refill hydrochlorothiazide and verapamil, will try to make next appointment in office to check blood pressure, she does not  have an ambulatory BP cuff.  Attention deficit hyperactivity disorder (ADHD), predominantly inattentive type -She would like to try off Adderall, I agree.  Depression, recurrent ([place]) -Mood has been stable, refill Celexa.     I discussed the assessment and treatment plan with the patient. The patient was provided an opportunity to ask questions and all were answered. The patient agreed with the plan and demonstrated an understanding of the instructions.   The patient was advised to call back or seek an in-person evaluation if the symptoms worsen or if the condition fails to improve as anticipated.    Lelon Frohlich, MD  [facility]

## 2019-08-04 DIAGNOSIS — H524 Presbyopia: Secondary | ICD-10-CM | POA: Diagnosis not present

## 2019-08-05 DIAGNOSIS — C44319 Basal cell carcinoma of skin of other parts of face: Secondary | ICD-10-CM | POA: Diagnosis not present

## 2019-08-10 ENCOUNTER — Encounter: Payer: Self-pay | Admitting: Gynecology

## 2019-11-24 ENCOUNTER — Other Ambulatory Visit: Payer: Self-pay

## 2019-11-25 ENCOUNTER — Encounter: Payer: Self-pay | Admitting: Family Medicine

## 2019-11-25 ENCOUNTER — Ambulatory Visit (INDEPENDENT_AMBULATORY_CARE_PROVIDER_SITE_OTHER): Payer: BC Managed Care – PPO | Admitting: Family Medicine

## 2019-11-25 VITALS — BP 140/70 | HR 81 | Temp 97.3°F | Wt 258.2 lb

## 2019-11-25 DIAGNOSIS — H9313 Tinnitus, bilateral: Secondary | ICD-10-CM | POA: Diagnosis not present

## 2019-11-25 DIAGNOSIS — H6123 Impacted cerumen, bilateral: Secondary | ICD-10-CM | POA: Diagnosis not present

## 2019-11-25 NOTE — Progress Notes (Signed)
   Subjective:    Patient ID: Misty Benson, female    DOB: April 18, 1961, 59 y.o.   MRN: [id number]  HPI Here for ringing in both ears that began simultaneously on 10-30-19. That day she also had dizziness with nausea that sounds like classic vertigo, then this went away. She has had no dizziness ever since that day. No ear pain, no change in hearing. No recent medication changes. No hx of head trauma. She says the ringing sound is high pitched and the frequency is the same on both sides. No headache and no other neurologic deficits.    Review of Systems  Constitutional: Negative.   HENT: Positive for tinnitus. Negative for congestion, ear discharge, ear pain, postnasal drip, sinus pressure, sinus pain and sore throat.   Eyes: Negative.   Respiratory: Negative.   Cardiovascular: Negative.   Neurological: Negative.        Objective:   Physical Exam Constitutional:      General: She is not in acute distress.    Appearance: Normal appearance.  HENT:     Head: Normocephalic and atraumatic.     Right Ear: External ear normal.     Left Ear: External ear normal.     Ears:     Comments: Both canals are full of cerumen     Mouth/Throat:     Pharynx: Oropharynx is clear.  Eyes:     Extraocular Movements: Extraocular movements intact.     Conjunctiva/sclera: Conjunctivae normal.     Pupils: Pupils are equal, round, and reactive to light.  Cardiovascular:     Rate and Rhythm: Normal rate and regular rhythm.     Pulses: Normal pulses.     Heart sounds: Normal heart sounds.  Pulmonary:     Effort: Pulmonary effort is normal.     Breath sounds: Normal breath sounds.  Musculoskeletal:     Cervical back: Normal range of motion.  Neurological:     General: No focal deficit present.     Mental Status: She is alert and oriented to person, place, and time.           Assessment & Plan:  Bilateral cerumen impactions and sudden onset of bilateral tinnitus. Both ear canals were irrigated  clear with water. For the tinnitus, we will refer her to ENT. Alysia Penna, MD

## 2019-12-14 DIAGNOSIS — H903 Sensorineural hearing loss, bilateral: Secondary | ICD-10-CM | POA: Diagnosis not present

## 2019-12-14 DIAGNOSIS — H9313 Tinnitus, bilateral: Secondary | ICD-10-CM | POA: Diagnosis not present

## 2019-12-14 DIAGNOSIS — R42 Dizziness and giddiness: Secondary | ICD-10-CM | POA: Diagnosis not present

## 2019-12-22 ENCOUNTER — Ambulatory Visit: Payer: BC Managed Care – PPO | Attending: Internal Medicine

## 2019-12-22 DIAGNOSIS — Z20822 Contact with and (suspected) exposure to covid-19: Secondary | ICD-10-CM

## 2019-12-23 LAB — NOVEL CORONAVIRUS, NAA: SARS-CoV-2, NAA: NOT DETECTED

## 2020-01-12 ENCOUNTER — Other Ambulatory Visit: Payer: Self-pay | Admitting: Obstetrics & Gynecology

## 2020-01-12 ENCOUNTER — Other Ambulatory Visit: Payer: Self-pay | Admitting: Obstetrics and Gynecology

## 2020-01-12 DIAGNOSIS — Z1231 Encounter for screening mammogram for malignant neoplasm of breast: Secondary | ICD-10-CM

## 2020-01-12 DIAGNOSIS — D2261 Melanocytic nevi of right upper limb, including shoulder: Secondary | ICD-10-CM | POA: Diagnosis not present

## 2020-01-12 DIAGNOSIS — L570 Actinic keratosis: Secondary | ICD-10-CM | POA: Diagnosis not present

## 2020-01-12 DIAGNOSIS — L814 Other melanin hyperpigmentation: Secondary | ICD-10-CM | POA: Diagnosis not present

## 2020-01-12 DIAGNOSIS — D485 Neoplasm of uncertain behavior of skin: Secondary | ICD-10-CM | POA: Diagnosis not present

## 2020-01-12 DIAGNOSIS — D2262 Melanocytic nevi of left upper limb, including shoulder: Secondary | ICD-10-CM | POA: Diagnosis not present

## 2020-01-12 DIAGNOSIS — D225 Melanocytic nevi of trunk: Secondary | ICD-10-CM | POA: Diagnosis not present

## 2020-01-13 ENCOUNTER — Ambulatory Visit
Admission: RE | Admit: 2020-01-13 | Discharge: 2020-01-13 | Disposition: A | Discharge status: Home / Self Care | Payer: BC Managed Care – PPO | Source: Ambulatory Visit | Attending: Obstetrics & Gynecology | Admitting: Obstetrics & Gynecology

## 2020-01-13 ENCOUNTER — Other Ambulatory Visit: Payer: Self-pay

## 2020-01-13 DIAGNOSIS — Z1231 Encounter for screening mammogram for malignant neoplasm of breast: Secondary | ICD-10-CM

## 2020-01-25 ENCOUNTER — Ambulatory Visit (INDEPENDENT_AMBULATORY_CARE_PROVIDER_SITE_OTHER)
Admission: RE | Admit: 2020-01-25 | Discharge: 2020-01-25 | Disposition: A | Discharge status: Home / Self Care | Payer: BC Managed Care – PPO | Source: Ambulatory Visit

## 2020-01-25 ENCOUNTER — Other Ambulatory Visit: Payer: Self-pay

## 2020-01-25 DIAGNOSIS — M545 Low back pain, unspecified: Secondary | ICD-10-CM

## 2020-01-25 MED ORDER — PREDNISONE 10 MG PO TABS: ORAL_TABLET | ORAL | 0 refills | Status: DC

## 2020-01-25 MED ORDER — CYCLOBENZAPRINE HCL 5 MG PO TABS: 5.0000 mg | ORAL_TABLET | Freq: Two times a day (BID) | ORAL | 0 refills | Status: DC | PRN

## 2020-01-25 MED ORDER — TRAMADOL HCL 50 MG PO TABS: 50.0000 mg | ORAL_TABLET | Freq: Four times a day (QID) | ORAL | 0 refills | Status: DC | PRN

## 2020-01-25 NOTE — Discharge Instructions (Addendum)
Begin prednisone taper over the next 6 days- 6,5,4,3,2,1 You may use flexeril as needed to help with pain. This is a muscle relaxer and causes sedation- please use only at bedtime or when you will be home and not have to drive/work Tramadol for severe pain- may cause drowsiness   Follow up in person if not improving or worsening

## 2020-01-25 NOTE — ED Provider Notes (Signed)
Virtual Visit via Video Note:  Misty Benson  initiated request for Telemedicine visit with [facility] team. I connected with Misty Benson  on 01/25/2020 at 2:07 PM  for a synchronized telemedicine visit using a video enabled HIPPA compliant telemedicine application. I verified that I am speaking with Misty Benson  using two identifiers. Misty Benson C Joell Usman, PA-C  was physically located in a [facility] Urgent care site and Misty Benson was located at a different location.   The limitations of evaluation and management by telemedicine as well as the availability of in-person appointments were discussed. Patient was informed that she  may incur a bill ( including co-pay) for this virtual visit encounter. Misty Benson  expressed understanding and gave verbal consent to proceed with virtual visit.     History of Present Illness:Misty Benson  is a 59 y.o. female presents for evaluation of back pain.  Patient states that beginning yesterday morning she began to develop back pain.  Symptoms worsened quickly and notes that she has had back pain that has been debilitating her today.  She notes that she has history of recurrent back problems off and on since she was a teenager.  She has had multiple imaging which has been negative.  She plans to go to a chiropractor appointment tomorrow.  Denies any new injury or any recent heavy lifting or trauma that may have triggered symptoms.  Feels occasional muscle spasms.  Pain is located in her lower back, radiate towards left side of back.  Denies any radiation into legs.  Denies numbness or tingling.  Denies controlling urination or bowels.  She has taken ibuprofen 800, but is concerned as she is moving her daughter to [place] next week and is hoping to get relief of this quickly in order to be comfortable during the move.      No Known Allergies   Past Medical History:  Diagnosis Date  . ADHD (attention deficit hyperactivity disorder)   .  Anxiety   . Cancer ([place])    BASAL CELL FACE AND SHOULDERS , SQUAMOS CELL ON HAND AND MELANOMA LEFT ARM.Marland Kitchen.  . Cervix cancer ([place]) 1987   microinvasive  . Hypertension   . Leiomyoma 2013   multiple small  . Melanoma ([place])   . Obesity      Social History   Tobacco Use  . Smoking status: Never Smoker  . Smokeless tobacco: Never Used  Substance Use Topics  . Alcohol use: Yes    Alcohol/week: 4.0 standard drinks    Types: 4 Standard drinks or equivalent per week    Comment: WINE   . Drug use: No        Observations/Objective: Physical Exam  Constitutional: She is oriented to person, place, and time and well-developed, well-nourished, and in no distress. No distress.  HENT:  Head: Normocephalic and atraumatic.  Eyes:  Wearing glasses  Pulmonary/Chest: Effort normal. No respiratory distress.  Speaking full sentences  Musculoskeletal:     Cervical back: Normal range of motion.  Neurological: She is alert and oriented to person, place, and time.  Speech clear, face symmetric     Assessment and Plan:    ICD-10-CM   1. Acute midline low back pain without sciatica  M54.5      Patient with low back pain, using NSAIDs without relief, will provide prednisone as alternative, providing 6-day taper.  Flexeril to supplement.  Did provide a few days of tramadol for severe  pain as well as to help with getting to appointments tomorrow.  Discussed drowsiness associated with Flexeril and tramadol and recommended to use sparingly, do not drive after taking.  Gentle stretching, avoid complete bedrest.  Discussed strict return precautions. Patient verbalized understanding and is agreeable with plan.   Follow Up Instructions:     I discussed the assessment and treatment plan with the patient. The patient was provided an opportunity to ask questions and all were answered. The patient agreed with the plan and demonstrated an understanding of the instructions.   The patient was advised to  call back or seek an in-person evaluation if the symptoms worsen or if the condition fails to improve as anticipated.      Janith Lima, PA-C  01/25/2020 2:07 PM         Debara Pickett C, PA-C 01/25/20 1408

## 2020-01-26 DIAGNOSIS — M5440 Lumbago with sciatica, unspecified side: Secondary | ICD-10-CM | POA: Diagnosis not present

## 2020-01-26 DIAGNOSIS — M9902 Segmental and somatic dysfunction of thoracic region: Secondary | ICD-10-CM | POA: Diagnosis not present

## 2020-01-26 DIAGNOSIS — M62830 Muscle spasm of back: Secondary | ICD-10-CM | POA: Diagnosis not present

## 2020-01-26 DIAGNOSIS — M9903 Segmental and somatic dysfunction of lumbar region: Secondary | ICD-10-CM | POA: Diagnosis not present

## 2020-01-27 DIAGNOSIS — M9902 Segmental and somatic dysfunction of thoracic region: Secondary | ICD-10-CM | POA: Diagnosis not present

## 2020-01-27 DIAGNOSIS — M5440 Lumbago with sciatica, unspecified side: Secondary | ICD-10-CM | POA: Diagnosis not present

## 2020-01-27 DIAGNOSIS — M9903 Segmental and somatic dysfunction of lumbar region: Secondary | ICD-10-CM | POA: Diagnosis not present

## 2020-01-27 DIAGNOSIS — M62830 Muscle spasm of back: Secondary | ICD-10-CM | POA: Diagnosis not present

## 2020-01-31 ENCOUNTER — Ambulatory Visit: Payer: BC Managed Care – PPO | Attending: Internal Medicine

## 2020-01-31 DIAGNOSIS — M62830 Muscle spasm of back: Secondary | ICD-10-CM | POA: Diagnosis not present

## 2020-01-31 DIAGNOSIS — M5440 Lumbago with sciatica, unspecified side: Secondary | ICD-10-CM | POA: Diagnosis not present

## 2020-01-31 DIAGNOSIS — M9902 Segmental and somatic dysfunction of thoracic region: Secondary | ICD-10-CM | POA: Diagnosis not present

## 2020-01-31 DIAGNOSIS — Z20822 Contact with and (suspected) exposure to covid-19: Secondary | ICD-10-CM | POA: Diagnosis not present

## 2020-01-31 DIAGNOSIS — M9903 Segmental and somatic dysfunction of lumbar region: Secondary | ICD-10-CM | POA: Diagnosis not present

## 2020-02-01 LAB — NOVEL CORONAVIRUS, NAA: SARS-CoV-2, NAA: NOT DETECTED

## 2020-02-01 LAB — SARS-COV-2, NAA 2 DAY TAT

## 2020-02-14 ENCOUNTER — Ambulatory Visit: Payer: BC Managed Care – PPO | Attending: Internal Medicine

## 2020-02-14 DIAGNOSIS — Z20822 Contact with and (suspected) exposure to covid-19: Secondary | ICD-10-CM | POA: Diagnosis not present

## 2020-02-15 LAB — SARS-COV-2, NAA 2 DAY TAT

## 2020-02-15 LAB — NOVEL CORONAVIRUS, NAA: SARS-CoV-2, NAA: NOT DETECTED

## 2020-02-17 DIAGNOSIS — C44719 Basal cell carcinoma of skin of left lower limb, including hip: Secondary | ICD-10-CM | POA: Diagnosis not present

## 2020-02-17 DIAGNOSIS — L820 Inflamed seborrheic keratosis: Secondary | ICD-10-CM | POA: Diagnosis not present

## 2020-02-22 ENCOUNTER — Other Ambulatory Visit: Payer: Self-pay

## 2020-02-23 ENCOUNTER — Encounter: Payer: Self-pay | Admitting: Obstetrics and Gynecology

## 2020-02-23 ENCOUNTER — Ambulatory Visit (INDEPENDENT_AMBULATORY_CARE_PROVIDER_SITE_OTHER): Payer: BC Managed Care – PPO | Admitting: Obstetrics and Gynecology

## 2020-02-23 VITALS — BP 120/76 | Ht 68.5 in | Wt 259.0 lb

## 2020-02-23 DIAGNOSIS — Z01419 Encounter for gynecological examination (general) (routine) without abnormal findings: Secondary | ICD-10-CM

## 2020-02-23 DIAGNOSIS — Z13220 Encounter for screening for lipoid disorders: Secondary | ICD-10-CM

## 2020-02-23 NOTE — Progress Notes (Signed)
Misty Benson 1961-03-31 [id number]  SUBJECTIVE:  59 y.o. [id number] female for annual routine gynecologic exam and Pap smear. She has no gynecologic concerns.  Current Outpatient Medications  Medication Sig Dispense Refill  . acetaminophen (TYLENOL) 500 MG tablet Take 1,000 mg by mouth every 6 (six) hours as needed for mild pain, moderate pain, fever or headache.    . citalopram (CELEXA) 20 MG tablet Take 1 tablet (20 mg total) by mouth daily. 90 tablet 1  . cyclobenzaprine (FLEXERIL) 5 MG tablet Take 1-2 tablets (5-10 mg total) by mouth 2 (two) times daily as needed for muscle spasms. 30 tablet 0  . hydrochlorothiazide (MICROZIDE) 12.5 MG capsule Take 1 capsule (12.5 mg total) by mouth daily. 90 capsule 1  . ibuprofen (ADVIL,MOTRIN) 200 MG tablet Take 3-4 tablets (600-800 mg total) by mouth every 6 (six) hours as needed for fever, headache, mild pain, moderate pain or cramping. 30 tablet 6  . traMADol (ULTRAM) 50 MG tablet Take 1 tablet (50 mg total) by mouth every 6 (six) hours as needed. 12 tablet 0  . verapamil (CALAN-SR) 240 MG CR tablet Take 1 tablet (240 mg total) by mouth daily. 90 tablet 1  . albuterol (PROVENTIL HFA;VENTOLIN HFA) 108 (90 Base) MCG/ACT inhaler Inhale 1-2 puffs into the lungs every 6 (six) hours as needed for wheezing or shortness of breath. (Patient not taking: Reported on 02/23/2020) 1 Inhaler 0  . LORazepam (ATIVAN) 0.5 MG tablet Take 1 tablet (0.5 mg total) by mouth 2 (two) times daily as needed for anxiety. (Patient not taking: Reported on 02/23/2020) 30 tablet 0  . zolpidem (AMBIEN) 10 MG tablet Take 0.5-1 tablets (5-10 mg total) by mouth at bedtime as needed for sleep. (Patient not taking: Reported on 02/23/2020) 30 tablet 3   No current facility-administered medications for this visit.   Allergies: Patient has no known allergies.  Patient's last menstrual period was 05/30/2012.  Past medical history,surgical history, problem list, medications, allergies, family  history and social history were all reviewed and documented as reviewed in the EPIC chart.  ROS:  Feeling well. No dyspnea or chest pain on exertion.  No abdominal pain, change in bowel habits, black or bloody stools.  No urinary tract symptoms. GYN ROS:  no abnormal bleeding, pelvic pain or discharge, no breast pain or new or enlarging lumps on self exam. No neurological complaints.   OBJECTIVE:  BP 120/76   Ht 5' 8.5" (1.74 m)   Wt 259 lb (117.5 kg)   LMP 05/30/2012   BMI 38.81 kg/m  The patient appears well, alert, oriented x 3, in no distress. ENT normal.  Neck supple. No cervical or supraclavicular adenopathy or thyromegaly.  Lungs are clear, good air entry, no wheezes, rhonchi or rales. S1 and S2 normal, no murmurs, regular rate and rhythm.  Abdomen soft without tenderness, guarding, mass or organomegaly.  Neurological is normal, no focal findings.  BREAST EXAM: breasts appear normal, no suspicious masses, no skin or nipple changes or axillary nodes  PELVIC EXAM: VULVA: normal appearing vulva with no masses, tenderness or lesions, VAGINA: Grade 1 rectocele, normal appearing vagina with normal color and discharge, no lesions, CERVIX: normal appearing cervix without discharge or lesions, UTERUS: uterus is normal size, shape, consistency and nontender, ADNEXA: normal adnexa in size, nontender and no masses  Chaperone: [name] present during the examination  ASSESSMENT:  59 y.o. [id number] here for annual gynecologic exam  PLAN:   1. Postmenopausal.  No menopausal symptoms.  No  vaginal bleeding. 2. Mild rectocele is asymptomatic. 3. Pap smear 01/2019.  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  Pap smears have been normal since then.  Next Pap smear due in 2023 following the current 3-year guideline. 4. Mammogram 01/2020.  Normal breast exam today.  Continue annual mammograms. 5. Colonoscopy 2011.  Recommended that she follow up at the  recommended interval, which would be this year.  She will plan to call to schedule. 6. DEXA.  We discussed that we will plan this at age 59. 42. Health maintenance.  She will proceed to lab today for routine screening blood work (lipids, CBC, CMP), elevated LDL and total cholesterol noted last year, I recommend that she make up an appointment with Dr. Jerilee Hoh to review the results and any recommended treatment of elevated cholesterol/LDL if that proves to be the case again this year.  Vitamin D level was normal last year and she remains on the same dose so we will not recheck this year.  Return annually or sooner, prn.  Joseph Pierini MD, FACOG  02/23/20

## 2020-02-24 LAB — CBC
HCT: 40.3 % (ref 35.0–45.0)
Hemoglobin: 13.3 g/dL (ref 11.7–15.5)
MCH: 30.7 pg (ref 27.0–33.0)
MCHC: 33 g/dL (ref 32.0–36.0)
MCV: 93.1 fL (ref 80.0–100.0)
MPV: 10.5 fL (ref 7.5–12.5)
Platelets: 280 10*3/uL (ref 140–400)
RBC: 4.33 10*6/uL (ref 3.80–5.10)
RDW: 12.9 % (ref 11.0–15.0)
WBC: 5.4 10*3/uL (ref 3.8–10.8)

## 2020-02-24 LAB — COMPREHENSIVE METABOLIC PANEL
AG Ratio: 1.9 (calc) (ref 1.0–2.5)
ALT: 18 U/L (ref 6–29)
AST: 16 U/L (ref 10–35)
Albumin: 4.1 g/dL (ref 3.6–5.1)
Alkaline phosphatase (APISO): 63 U/L (ref 37–153)
BUN: 21 mg/dL (ref 7–25)
CO2: 26 mmol/L (ref 20–32)
Calcium: 9.5 mg/dL (ref 8.6–10.4)
Chloride: 107 mmol/L (ref 98–110)
Creat: 0.78 mg/dL (ref 0.50–1.05)
Globulin: 2.2 g/dL (calc) (ref 1.9–3.7)
Glucose, Bld: 93 mg/dL (ref 65–99)
Potassium: 4.4 mmol/L (ref 3.5–5.3)
Sodium: 141 mmol/L (ref 135–146)
Total Bilirubin: 0.4 mg/dL (ref 0.2–1.2)
Total Protein: 6.3 g/dL (ref 6.1–8.1)

## 2020-02-24 LAB — LIPID PANEL
Cholesterol: 241 mg/dL — ABNORMAL HIGH (ref <200)
HDL: 70 mg/dL (ref >=50)
LDL Cholesterol (Calc): 140 mg/dL (calc) — ABNORMAL HIGH
Non-HDL Cholesterol (Calc): 171 mg/dL (calc) — ABNORMAL HIGH (ref <130)
Total CHOL/HDL Ratio: 3.4 (calc) (ref <5.0)
Triglycerides: 174 mg/dL — ABNORMAL HIGH (ref <150)

## 2020-03-02 DIAGNOSIS — M65311 Trigger thumb, right thumb: Secondary | ICD-10-CM | POA: Diagnosis not present

## 2020-03-08 ENCOUNTER — Ambulatory Visit: Payer: BC Managed Care – PPO | Admitting: Internal Medicine

## 2020-03-27 ENCOUNTER — Other Ambulatory Visit: Payer: Self-pay | Admitting: Internal Medicine

## 2020-03-27 DIAGNOSIS — I10 Essential (primary) hypertension: Secondary | ICD-10-CM

## 2020-03-27 DIAGNOSIS — F900 Attention-deficit hyperactivity disorder, predominantly inattentive type: Secondary | ICD-10-CM

## 2020-06-01 DIAGNOSIS — M65311 Trigger thumb, right thumb: Secondary | ICD-10-CM | POA: Diagnosis not present

## 2020-06-25 ENCOUNTER — Other Ambulatory Visit: Payer: Self-pay | Admitting: Internal Medicine

## 2020-06-25 DIAGNOSIS — F900 Attention-deficit hyperactivity disorder, predominantly inattentive type: Secondary | ICD-10-CM

## 2020-06-25 DIAGNOSIS — I10 Essential (primary) hypertension: Secondary | ICD-10-CM

## 2020-06-29 ENCOUNTER — Encounter: Payer: Self-pay | Admitting: Internal Medicine

## 2020-06-29 DIAGNOSIS — Z1211 Encounter for screening for malignant neoplasm of colon: Secondary | ICD-10-CM

## 2020-07-04 DIAGNOSIS — H0100B Unspecified blepharitis left eye, upper and lower eyelids: Secondary | ICD-10-CM | POA: Diagnosis not present

## 2020-07-04 DIAGNOSIS — H2513 Age-related nuclear cataract, bilateral: Secondary | ICD-10-CM | POA: Diagnosis not present

## 2020-07-04 DIAGNOSIS — H0100A Unspecified blepharitis right eye, upper and lower eyelids: Secondary | ICD-10-CM | POA: Diagnosis not present

## 2020-07-05 DIAGNOSIS — H903 Sensorineural hearing loss, bilateral: Secondary | ICD-10-CM | POA: Diagnosis not present

## 2020-07-05 DIAGNOSIS — H9313 Tinnitus, bilateral: Secondary | ICD-10-CM | POA: Diagnosis not present

## 2020-07-05 DIAGNOSIS — H6123 Impacted cerumen, bilateral: Secondary | ICD-10-CM | POA: Diagnosis not present

## 2020-07-12 ENCOUNTER — Other Ambulatory Visit: Payer: Self-pay

## 2020-07-12 DIAGNOSIS — L814 Other melanin hyperpigmentation: Secondary | ICD-10-CM | POA: Diagnosis not present

## 2020-07-12 DIAGNOSIS — C44311 Basal cell carcinoma of skin of nose: Secondary | ICD-10-CM | POA: Diagnosis not present

## 2020-07-12 DIAGNOSIS — Z85828 Personal history of other malignant neoplasm of skin: Secondary | ICD-10-CM | POA: Diagnosis not present

## 2020-07-12 DIAGNOSIS — D485 Neoplasm of uncertain behavior of skin: Secondary | ICD-10-CM | POA: Diagnosis not present

## 2020-07-12 DIAGNOSIS — Z85820 Personal history of malignant melanoma of skin: Secondary | ICD-10-CM | POA: Diagnosis not present

## 2020-07-12 DIAGNOSIS — D225 Melanocytic nevi of trunk: Secondary | ICD-10-CM | POA: Diagnosis not present

## 2020-07-12 DIAGNOSIS — D224 Melanocytic nevi of scalp and neck: Secondary | ICD-10-CM | POA: Diagnosis not present

## 2020-07-13 ENCOUNTER — Encounter: Payer: Self-pay | Admitting: Internal Medicine

## 2020-07-13 ENCOUNTER — Ambulatory Visit (INDEPENDENT_AMBULATORY_CARE_PROVIDER_SITE_OTHER): Payer: BC Managed Care – PPO | Admitting: Internal Medicine

## 2020-07-13 VITALS — BP 130/80 | HR 62 | Temp 98.1°F | Wt 254.5 lb

## 2020-07-13 DIAGNOSIS — F339 Major depressive disorder, recurrent, unspecified: Secondary | ICD-10-CM | POA: Diagnosis not present

## 2020-07-13 DIAGNOSIS — Z23 Encounter for immunization: Secondary | ICD-10-CM

## 2020-07-13 DIAGNOSIS — F900 Attention-deficit hyperactivity disorder, predominantly inattentive type: Secondary | ICD-10-CM | POA: Diagnosis not present

## 2020-07-13 DIAGNOSIS — I10 Essential (primary) hypertension: Secondary | ICD-10-CM

## 2020-07-13 DIAGNOSIS — E7849 Other hyperlipidemia: Secondary | ICD-10-CM

## 2020-07-13 DIAGNOSIS — I872 Venous insufficiency (chronic) (peripheral): Secondary | ICD-10-CM

## 2020-07-13 DIAGNOSIS — E785 Hyperlipidemia, unspecified: Secondary | ICD-10-CM | POA: Insufficient documentation

## 2020-07-13 MED ORDER — HYDROCHLOROTHIAZIDE 12.5 MG PO CAPS: 12.5000 mg | ORAL_CAPSULE | Freq: Every day | ORAL | 1 refills | Status: DC

## 2020-07-13 MED ORDER — VERAPAMIL HCL ER 240 MG PO TBCR: 240.0000 mg | EXTENDED_RELEASE_TABLET | Freq: Every day | ORAL | 1 refills | Status: DC

## 2020-07-13 MED ORDER — CITALOPRAM HYDROBROMIDE 20 MG PO TABS: 20.0000 mg | ORAL_TABLET | Freq: Every day | ORAL | 1 refills | Status: DC

## 2020-07-13 NOTE — Addendum Note (Signed)
Addended by: Westley Hummer B on: 07/13/2020 04:51 PM   Modules accepted: Orders

## 2020-07-13 NOTE — Patient Instructions (Signed)
-  Nice seeing you today!!  -Flu and first shingles vaccines today.  -Schedule follow up in 3-4 months. Please come in fasting so we can do your physical.

## 2020-07-13 NOTE — Progress Notes (Signed)
Established Patient Office Visit     This visit occurred during the SARS-CoV-2 public health emergency.  Safety protocols were in place, including screening questions prior to the visit, additional usage of staff PPE, and extensive cleaning of exam room while observing appropriate contact time as indicated for disinfecting solutions.    CC/Reason for Visit: Follow-up chronic medical conditions, medication refills  HPI: Misty Benson is a 59 y.o. female who is coming in today for the above mentioned reasons. Past Medical History is significant for: Morbid obesity, well-controlled hypertension, depression with stable mood, ADHD not currently on medications and hyperlipidemia.  She has been having bilateral lower extremity swelling that she has noticed has been worse after long car rides, she has been traveling a lot this summer.  She has an appointment scheduled for next week with [facility] due to some bowel habit changes.  She is also overdue for screening colonoscopy.  She is requesting flu and for shingles vaccine today.  She has been vaccinated against COVID-19.  She has started working on a healthier lifestyle and has been able to lose 5 pounds over the summer.   Past Medical/Surgical History: Past Medical History:  Diagnosis Date  . ADHD (attention deficit hyperactivity disorder)   . Anxiety   . Cancer ([place])    BASAL CELL FACE AND SHOULDERS , SQUAMOS CELL ON HAND AND MELANOMA LEFT ARM.[facility].  . Cervix cancer ([place]) 1987   microinvasive  . Hypertension   . Leiomyoma 2013   multiple small  . Melanoma ([place])   . Obesity     Past Surgical History:  Procedure Laterality Date  . arm surgery     to remove melanoma on left upper outter arm  . BUNIONECTOMY     LEFT FOOT WITH PINS  . CERVICAL CONE BIOPSY  1987  . CESAREAN SECTION    . DILATION AND CURETTAGE OF UTERUS     x2  . FOOT SURGERY    . MELANOMA EXCISION    . TONSILLECTOMY     as a child    Social History:  reports  that she has never smoked. She has never used smokeless tobacco. She reports current alcohol use of about 4.0 standard drinks of alcohol per week. She reports that she does not use drugs.  Allergies: No Known Allergies  Family History:  Family History  Problem Relation Age of Onset  . Stroke Mother   . Osteoporosis Mother   . Hyperlipidemia Father   . Atrial fibrillation Father   . Cancer Father        Prostate  . Breast cancer Maternal Grandmother 57     Current Outpatient Medications:  .  acetaminophen (TYLENOL) 500 MG tablet, Take 1,000 mg by mouth every 6 (six) hours as needed for mild pain, moderate pain, fever or headache., Disp: , Rfl:  .  albuterol (PROVENTIL HFA;VENTOLIN HFA) 108 (90 Base) MCG/ACT inhaler, Inhale 1-2 puffs into the lungs every 6 (six) hours as needed for wheezing or shortness of breath., Disp: 1 Inhaler, Rfl: 0 .  cyclobenzaprine (FLEXERIL) 5 MG tablet, Take 1-2 tablets (5-10 mg total) by mouth 2 (two) times daily as needed for muscle spasms., Disp: 30 tablet, Rfl: 0 .  ibuprofen (ADVIL,MOTRIN) 200 MG tablet, Take 3-4 tablets (600-800 mg total) by mouth every 6 (six) hours as needed for fever, headache, mild pain, moderate pain or cramping., Disp: 30 tablet, Rfl: 6 .  LORazepam (ATIVAN) 0.5 MG tablet, Take 1 tablet (  0.5 mg total) by mouth 2 (two) times daily as needed for anxiety., Disp: 30 tablet, Rfl: 0 .  citalopram (CELEXA) 20 MG tablet, Take 1 tablet (20 mg total) by mouth daily., Disp: 90 tablet, Rfl: 1 .  hydrochlorothiazide (MICROZIDE) 12.5 MG capsule, Take 1 capsule (12.5 mg total) by mouth daily., Disp: 90 capsule, Rfl: 1 .  verapamil (CALAN-SR) 240 MG CR tablet, Take 1 tablet (240 mg total) by mouth daily., Disp: 90 tablet, Rfl: 1  Review of Systems:  Constitutional: Denies fever, chills, diaphoresis, appetite change and fatigue.  HEENT: Denies photophobia, eye pain, redness, hearing loss, ear pain, congestion, sore throat, rhinorrhea, sneezing,  mouth sores, trouble swallowing, neck pain, neck stiffness and tinnitus.   Respiratory: Denies SOB, DOE, cough, chest tightness,  and wheezing.   Cardiovascular: Denies chest pain, palpitations. Gastrointestinal: Denies nausea, vomiting, abdominal pain, diarrhea, constipation, blood in stool and abdominal distention.  Genitourinary: Denies dysuria, urgency, frequency, hematuria, flank pain and difficulty urinating.  Endocrine: Denies: hot or cold intolerance, sweats, changes in hair or nails, polyuria, polydipsia. Musculoskeletal: Denies myalgias, back pain, joint swelling, arthralgias and gait problem.  Skin: Denies pallor, rash and wound.  Neurological: Denies dizziness, seizures, syncope, weakness, light-headedness, numbness and headaches.  Hematological: Denies adenopathy. Easy bruising, personal or family bleeding history  Psychiatric/Behavioral: Denies suicidal ideation, mood changes, confusion, nervousness, sleep disturbance and agitation    Physical Exam: Vitals:   07/13/20 0902  BP: 130/80  Pulse: 62  Temp: 98.1 F (36.7 C)  TempSrc: Oral  SpO2: 94%  Weight: 254 lb 8 oz (115.4 kg)    Body mass index is 38.13 kg/m.   Constitutional: NAD, calm, comfortable Eyes: PERRL, lids and conjunctivae normal ENMT: Mucous membranes are moist. Respiratory: clear to auscultation bilaterally, no wheezing, no crackles. Normal respiratory effort. No accessory muscle use.  Cardiovascular: Regular rate and rhythm, no murmurs / rubs / gallops.  1+ bilateral pitting lower extremity edema.   Neurologic: C grossly intact and nonfocal. Psychiatric: Normal judgment and insight. Alert and oriented x 3. Normal mood.    Impression and Plan:  Depression, recurrent ([place]) -Mood is stable, refill Celexa.  Attention deficit hyperactivity disorder (ADHD), predominantly inattentive type  -She has been off Adderall for 6 months and tolerating this well.  Essential hypertension   -Well-controlled. - Plan: Refill hydrochlorothiazide (MICROZIDE) 12.5 MG capsule, verapamil (CALAN-SR) 240 MG CR tablet  Other hyperlipidemia -Last LDL was 140 in April 2021, recheck lipids when she returns in 3 to 4 months, she is working on lifestyle modifications.  Morbid obesity ([place]) -Discussed healthy lifestyle, including increased physical activity and better food choices to promote weightloss. -She has been congratulated on weight loss thus far.  Chronic venous insufficiency -Advised leg elevation and compression stockings.  She will receive flu and first shingles vaccines today.   Patient Instructions  -Nice seeing you today!!  -Flu and first shingles vaccines today.  -Schedule follow up in 3-4 months. Please come in fasting so we can do your physical.       Lelon Frohlich, MD [facility]

## 2020-07-19 DIAGNOSIS — Z1211 Encounter for screening for malignant neoplasm of colon: Secondary | ICD-10-CM | POA: Diagnosis not present

## 2020-07-19 DIAGNOSIS — R194 Change in bowel habit: Secondary | ICD-10-CM | POA: Diagnosis not present

## 2020-07-20 DIAGNOSIS — M65311 Trigger thumb, right thumb: Secondary | ICD-10-CM | POA: Diagnosis not present

## 2020-08-04 DIAGNOSIS — H01003 Unspecified blepharitis right eye, unspecified eyelid: Secondary | ICD-10-CM | POA: Diagnosis not present

## 2020-08-17 DIAGNOSIS — C44311 Basal cell carcinoma of skin of nose: Secondary | ICD-10-CM | POA: Diagnosis not present

## 2020-09-01 DIAGNOSIS — M79672 Pain in left foot: Secondary | ICD-10-CM | POA: Diagnosis not present

## 2020-09-01 DIAGNOSIS — M76822 Posterior tibial tendinitis, left leg: Secondary | ICD-10-CM | POA: Diagnosis not present

## 2020-09-01 DIAGNOSIS — M21622 Bunionette of left foot: Secondary | ICD-10-CM | POA: Diagnosis not present

## 2020-09-05 ENCOUNTER — Telehealth: Payer: Self-pay | Admitting: Internal Medicine

## 2020-09-05 NOTE — Telephone Encounter (Signed)
Spoke with patient and a virtual visit scheduled  

## 2020-09-05 NOTE — Telephone Encounter (Signed)
Pt call and stated she want to go back on amphetamine-dextroamphetamine (ADDERALL XR) 30 MG 24 hr capsule and want it sent to  [facility], [address] AT [address] Phone:  [phone]  Fax:  [phone]

## 2020-09-05 NOTE — Telephone Encounter (Signed)
Need OV to discuss. Can be virtual.

## 2020-09-07 ENCOUNTER — Telehealth (INDEPENDENT_AMBULATORY_CARE_PROVIDER_SITE_OTHER): Payer: BC Managed Care – PPO | Admitting: Internal Medicine

## 2020-09-07 DIAGNOSIS — F900 Attention-deficit hyperactivity disorder, predominantly inattentive type: Secondary | ICD-10-CM | POA: Diagnosis not present

## 2020-09-07 MED ORDER — AMPHETAMINE-DEXTROAMPHETAMINE 20 MG PO TABS: 20.0000 mg | ORAL_TABLET | Freq: Two times a day (BID) | ORAL | 0 refills | Status: DC

## 2020-09-07 NOTE — Progress Notes (Signed)
Virtual Visit via Telephone Note  I connected with Misty Benson on 09/07/20 at  3:45 PM EDT by telephone and verified that I am speaking with the correct person using two identifiers.   I discussed the limitations, risks, security and privacy concerns of performing an evaluation and management service by telephone and the availability of in person appointments. I also discussed with the patient that there may be a patient responsible charge related to this service. The patient expressed understanding and agreed to proceed.  Location patient: home Location provider: work office Participants present for the call: patient, provider Patient did not have a visit in the prior 7 days to address this/these issue(s).   History of Present Illness:  She has a history of ADHD and had been on Adderall but quit taking it earlier in the year.  She states she has been "muddling along" but recently has noticed that she is having more difficulty staying on task and would like to go back on it.  She had been on 20 mg twice daily and had tolerated dose well.   Observations/Objective: Patient sounds cheerful and well on the phone. I do not appreciate any increased work of breathing. Speech and thought processing are grossly intact. Patient reported vitals: None reported   Current Outpatient Medications:  .  acetaminophen (TYLENOL) 500 MG tablet, Take 1,000 mg by mouth every 6 (six) hours as needed for mild pain, moderate pain, fever or headache., Disp: , Rfl:  .  albuterol (PROVENTIL HFA;VENTOLIN HFA) 108 (90 Base) MCG/ACT inhaler, Inhale 1-2 puffs into the lungs every 6 (six) hours as needed for wheezing or shortness of breath., Disp: 1 Inhaler, Rfl: 0 .  citalopram (CELEXA) 20 MG tablet, Take 1 tablet (20 mg total) by mouth daily., Disp: 90 tablet, Rfl: 1 .  cyclobenzaprine (FLEXERIL) 5 MG tablet, Take 1-2 tablets (5-10 mg total) by mouth 2 (two) times daily as needed for muscle spasms., Disp: 30  tablet, Rfl: 0 .  hydrochlorothiazide (MICROZIDE) 12.5 MG capsule, Take 1 capsule (12.5 mg total) by mouth daily., Disp: 90 capsule, Rfl: 1 .  ibuprofen (ADVIL,MOTRIN) 200 MG tablet, Take 3-4 tablets (600-800 mg total) by mouth every 6 (six) hours as needed for fever, headache, mild pain, moderate pain or cramping., Disp: 30 tablet, Rfl: 6 .  LORazepam (ATIVAN) 0.5 MG tablet, Take 1 tablet (0.5 mg total) by mouth 2 (two) times daily as needed for anxiety., Disp: 30 tablet, Rfl: 0 .  verapamil (CALAN-SR) 240 MG CR tablet, Take 1 tablet (240 mg total) by mouth daily., Disp: 90 tablet, Rfl: 1 .  amphetamine-dextroamphetamine (ADDERALL) 20 MG tablet, Take 1 tablet (20 mg total) by mouth 2 (two) times daily., Disp: 60 tablet, Rfl: 0 .  amphetamine-dextroamphetamine (ADDERALL) 20 MG tablet, Take 1 tablet (20 mg total) by mouth 2 (two) times daily., Disp: 60 tablet, Rfl: 0 .  amphetamine-dextroamphetamine (ADDERALL) 20 MG tablet, Take 1 tablet (20 mg total) by mouth 2 (two) times daily., Disp: 60 tablet, Rfl: 0  Review of Systems:  Constitutional: Denies fever, chills, diaphoresis, appetite change and fatigue.  HEENT: Denies photophobia, eye pain, redness, hearing loss, ear pain, congestion, sore throat, rhinorrhea, sneezing, mouth sores, trouble swallowing, neck pain, neck stiffness and tinnitus.   Respiratory: Denies SOB, DOE, cough, chest tightness,  and wheezing.   Cardiovascular: Denies chest pain, palpitations and leg swelling.  Gastrointestinal: Denies nausea, vomiting, abdominal pain, diarrhea, constipation, blood in stool and abdominal distention.  Genitourinary: Denies dysuria,  urgency, frequency, hematuria, flank pain and difficulty urinating.  Endocrine: Denies: hot or cold intolerance, sweats, changes in hair or nails, polyuria, polydipsia. Musculoskeletal: Denies myalgias, back pain, joint swelling, arthralgias and gait problem.  Skin: Denies pallor, rash and wound.  Neurological: Denies  dizziness, seizures, syncope, weakness, light-headedness, numbness and headaches.  Hematological: Denies adenopathy. Easy bruising, personal or family bleeding history  Psychiatric/Behavioral: Denies suicidal ideation, mood changes, confusion, nervousness, sleep disturbance and agitation   Assessment and Plan:  Attention deficit hyperactivity disorder (ADHD), predominantly inattentive type -PDMP has been reviewed, no red flags, overdose risk score is 190. -I have agreed to prescribe Adderall 20 mg for her to take twice daily for a total of 60 tablets a month x3 months.  I discussed the assessment and treatment plan with the patient. The patient was provided an opportunity to ask questions and all were answered. The patient agreed with the plan and demonstrated an understanding of the instructions.   The patient was advised to call back or seek an in-person evaluation if the symptoms worsen or if the condition fails to improve as anticipated.  I provided 17 minutes of non-face-to-face time during this encounter.   Lelon Frohlich, MD [facility] at [facility]

## 2020-09-09 DIAGNOSIS — G5702 Lesion of sciatic nerve, left lower limb: Secondary | ICD-10-CM | POA: Diagnosis not present

## 2020-10-13 DIAGNOSIS — Z20822 Contact with and (suspected) exposure to covid-19: Secondary | ICD-10-CM | POA: Diagnosis not present

## 2020-11-22 DIAGNOSIS — M1990 Unspecified osteoarthritis, unspecified site: Secondary | ICD-10-CM | POA: Diagnosis not present

## 2020-11-22 DIAGNOSIS — I10 Essential (primary) hypertension: Secondary | ICD-10-CM | POA: Diagnosis not present

## 2020-11-22 DIAGNOSIS — Z1211 Encounter for screening for malignant neoplasm of colon: Secondary | ICD-10-CM | POA: Diagnosis not present

## 2020-11-22 DIAGNOSIS — Z79899 Other long term (current) drug therapy: Secondary | ICD-10-CM | POA: Diagnosis not present

## 2020-11-22 DIAGNOSIS — K219 Gastro-esophageal reflux disease without esophagitis: Secondary | ICD-10-CM | POA: Diagnosis not present

## 2020-11-22 DIAGNOSIS — D120 Benign neoplasm of cecum: Secondary | ICD-10-CM | POA: Diagnosis not present

## 2020-11-22 DIAGNOSIS — Z791 Long term (current) use of non-steroidal anti-inflammatories (NSAID): Secondary | ICD-10-CM | POA: Diagnosis not present

## 2020-12-29 ENCOUNTER — Ambulatory Visit: Payer: BC Managed Care – PPO | Admitting: Internal Medicine

## 2021-01-04 ENCOUNTER — Other Ambulatory Visit: Payer: Self-pay

## 2021-01-05 ENCOUNTER — Encounter: Payer: Self-pay | Admitting: Internal Medicine

## 2021-01-05 ENCOUNTER — Ambulatory Visit (INDEPENDENT_AMBULATORY_CARE_PROVIDER_SITE_OTHER): Payer: BC Managed Care – PPO | Admitting: Internal Medicine

## 2021-01-05 VITALS — BP 130/80 | HR 70 | Temp 98.3°F | Wt 257.1 lb

## 2021-01-05 DIAGNOSIS — I10 Essential (primary) hypertension: Secondary | ICD-10-CM

## 2021-01-05 DIAGNOSIS — F339 Major depressive disorder, recurrent, unspecified: Secondary | ICD-10-CM | POA: Diagnosis not present

## 2021-01-05 DIAGNOSIS — F900 Attention-deficit hyperactivity disorder, predominantly inattentive type: Secondary | ICD-10-CM | POA: Diagnosis not present

## 2021-01-05 DIAGNOSIS — E7849 Other hyperlipidemia: Secondary | ICD-10-CM

## 2021-01-05 DIAGNOSIS — G4730 Sleep apnea, unspecified: Secondary | ICD-10-CM

## 2021-01-05 MED ORDER — AMPHETAMINE-DEXTROAMPHETAMINE 20 MG PO TABS: 20.0000 mg | ORAL_TABLET | Freq: Two times a day (BID) | ORAL | 0 refills | Status: DC

## 2021-01-05 NOTE — Progress Notes (Signed)
Established Patient Office Visit     This visit occurred during the SARS-CoV-2 public health emergency.  Safety protocols were in place, including screening questions prior to the visit, additional usage of staff PPE, and extensive cleaning of exam room while observing appropriate contact time as indicated for disinfecting solutions.    CC/Reason for Visit: Medication refills, follow-up chronic conditions, discuss acute concerns  HPI: Misty Benson is a 60 y.o. female who is coming in today for the above mentioned reasons. Past Medical History is significant for: Morbid obesity, well-controlled hypertension, depression with stable mood, ADHD not currently on medications and hyperlipidemia.  She would like to discuss 3 main issues:  1.  She believes she needs to go back on Adderall.  She is suffering with decreased motivation, lack of energy to start things, feels as if she is in a brain fog.  2.  She would like to discuss weight loss.  She is having difficulty losing weight.  3.  She believes she might have sleep apnea.  She snores a lot.  She wonders if some of her fatigue may be due to this, she wakes frequently at nighttime as well.   Past Medical/Surgical History: Past Medical History:  Diagnosis Date  . ADHD (attention deficit hyperactivity disorder)   . Anxiety   . Cancer ([place])    BASAL CELL FACE AND SHOULDERS , SQUAMOS CELL ON HAND AND MELANOMA LEFT ARM.Marland Kitchen.  . Cervix cancer ([place]) 1987   microinvasive  . Hypertension   . Leiomyoma 2013   multiple small  . Melanoma ([place])   . Obesity     Past Surgical History:  Procedure Laterality Date  . arm surgery     to remove melanoma on left upper outter arm  . BUNIONECTOMY     LEFT FOOT WITH PINS  . CERVICAL CONE BIOPSY  1987  . CESAREAN SECTION    . DILATION AND CURETTAGE OF UTERUS     x2  . FOOT SURGERY    . MELANOMA EXCISION    . TONSILLECTOMY     as a child    Social History:  reports that she has never  smoked. She has never used smokeless tobacco. She reports current alcohol use of about 4.0 standard drinks of alcohol per week. She reports that she does not use drugs.  Allergies: No Known Allergies  Family History:  Family History  Problem Relation Age of Onset  . Stroke Mother   . Osteoporosis Mother   . Hyperlipidemia Father   . Atrial fibrillation Father   . Cancer Father        Prostate  . Breast cancer Maternal Grandmother 63     Current Outpatient Medications:  .  acetaminophen (TYLENOL) 500 MG tablet, Take 1,000 mg by mouth every 6 (six) hours as needed for mild pain, moderate pain, fever or headache., Disp: , Rfl:  .  citalopram (CELEXA) 20 MG tablet, Take 1 tablet (20 mg total) by mouth daily., Disp: 90 tablet, Rfl: 1 .  hydrochlorothiazide (MICROZIDE) 12.5 MG capsule, Take 1 capsule (12.5 mg total) by mouth daily., Disp: 90 capsule, Rfl: 1 .  ibuprofen (ADVIL,MOTRIN) 200 MG tablet, Take 3-4 tablets (600-800 mg total) by mouth every 6 (six) hours as needed for fever, headache, mild pain, moderate pain or cramping., Disp: 30 tablet, Rfl: 6 .  verapamil (CALAN-SR) 240 MG CR tablet, Take 1 tablet (240 mg total) by mouth daily., Disp: 90 tablet, Rfl: 1 .  amphetamine-dextroamphetamine (  ADDERALL) 20 MG tablet, Take 1 tablet (20 mg total) by mouth 2 (two) times daily., Disp: 60 tablet, Rfl: 0 .  amphetamine-dextroamphetamine (ADDERALL) 20 MG tablet, Take 1 tablet (20 mg total) by mouth 2 (two) times daily., Disp: 60 tablet, Rfl: 0 .  amphetamine-dextroamphetamine (ADDERALL) 20 MG tablet, Take 1 tablet (20 mg total) by mouth 2 (two) times daily., Disp: 60 tablet, Rfl: 0 .  cyclobenzaprine (FLEXERIL) 5 MG tablet, Take 1-2 tablets (5-10 mg total) by mouth 2 (two) times daily as needed for muscle spasms. (Patient not taking: Reported on 01/05/2021), Disp: 30 tablet, Rfl: 0 .  diclofenac (VOLTAREN) 50 MG EC tablet, Take 50 mg by mouth 2 (two) times daily as needed., Disp: , Rfl:  .   LORazepam (ATIVAN) 0.5 MG tablet, Take 1 tablet (0.5 mg total) by mouth 2 (two) times daily as needed for anxiety. (Patient not taking: Reported on 01/05/2021), Disp: 30 tablet, Rfl: 0  Review of Systems:  Constitutional: Denies fever, chills, diaphoresis, appetite change.  HEENT: Denies photophobia, eye pain, redness, hearing loss, ear pain, congestion, sore throat, rhinorrhea, sneezing, mouth sores, trouble swallowing, neck pain, neck stiffness and tinnitus.   Respiratory: Denies SOB, DOE, cough, chest tightness,  and wheezing.   Cardiovascular: Denies chest pain, palpitations and leg swelling.  Gastrointestinal: Denies nausea, vomiting, abdominal pain, diarrhea, constipation, blood in stool and abdominal distention.  Genitourinary: Denies dysuria, urgency, frequency, hematuria, flank pain and difficulty urinating.  Endocrine: Denies: hot or cold intolerance, sweats, changes in hair or nails, polyuria, polydipsia. Musculoskeletal: Denies myalgias, back pain, joint swelling, arthralgias and gait problem.  Skin: Denies pallor, rash and wound.  Neurological: Denies dizziness, seizures, syncope, weakness, light-headedness, numbness and headaches.  Hematological: Denies adenopathy. Easy bruising, personal or family bleeding history  Psychiatric/Behavioral: Denies suicidal ideation, mood changes, confusion, nervousness and agitation    Physical Exam: Vitals:   01/05/21 1149  BP: 130/80  Pulse: 70  Temp: 98.3 F (36.8 C)  TempSrc: Oral  SpO2: 97%  Weight: 257 lb 1.6 oz (116.6 kg)    Body mass index is 38.52 kg/m.  Constitutional: NAD, calm, comfortable, obese Eyes: PERRL, lids and conjunctivae normal ENMT: Mucous membranes are moist. Respiratory: clear to auscultation bilaterally, no wheezing, no crackles. Normal respiratory effort. No accessory muscle use.  Cardiovascular: Regular rate and rhythm, no murmurs / rubs / gallops. No extremity edema.  Neurologic: Grossly intact and  nonfocal. Psychiatric: Normal judgment and insight. Alert and oriented x 3. Normal mood.    Impression and Plan:  Attention deficit hyperactivity disorder (ADHD), predominantly inattentive type  -PDMP reviewed, no red flags, overdose risk score 190. -Resume Adderall at previous dose which is 20 mg twice daily.  She would not always take the second dose.  She has been given enough for 3 months.  Morbid obesity ([place])  - Plan: Amb Ref to Medical Weight Management -Discussed healthy lifestyle, including increased physical activity and better food choices to promote weight loss.  Depression, recurrent ([place]) -Mood appears stable, continue Celexa.  Primary hypertension -Fairly well controlled today.  Other hyperlipidemia -LDL was 140 at last visit, recheck when she returns for physical, she is not on medication.  Sleep apnea, unspecified type  -I think her symptoms of snoring, obesity, daytime fatigue and frequent nighttime awakenings are likely due to obstructive sleep apnea. -I will refer her to sleep medicine for evaluation and management.    Patient Instructions  -Nice seeing you today!!  -Schedule follow up in 3 months for  your physical.     Lelon Frohlich, MD [facility]

## 2021-01-05 NOTE — Patient Instructions (Signed)
-  Nice seeing you today!!  -Schedule follow up in 3 months for your physical.

## 2021-01-08 ENCOUNTER — Other Ambulatory Visit: Payer: Self-pay | Admitting: Internal Medicine

## 2021-01-08 DIAGNOSIS — I10 Essential (primary) hypertension: Secondary | ICD-10-CM

## 2021-01-10 ENCOUNTER — Other Ambulatory Visit: Payer: Self-pay | Admitting: *Deleted

## 2021-01-10 DIAGNOSIS — D2261 Melanocytic nevi of right upper limb, including shoulder: Secondary | ICD-10-CM | POA: Diagnosis not present

## 2021-01-10 DIAGNOSIS — Z85820 Personal history of malignant melanoma of skin: Secondary | ICD-10-CM | POA: Diagnosis not present

## 2021-01-10 DIAGNOSIS — F900 Attention-deficit hyperactivity disorder, predominantly inattentive type: Secondary | ICD-10-CM

## 2021-01-10 DIAGNOSIS — Z85828 Personal history of other malignant neoplasm of skin: Secondary | ICD-10-CM | POA: Diagnosis not present

## 2021-01-10 DIAGNOSIS — C4441 Basal cell carcinoma of skin of scalp and neck: Secondary | ICD-10-CM | POA: Diagnosis not present

## 2021-01-10 DIAGNOSIS — L570 Actinic keratosis: Secondary | ICD-10-CM | POA: Diagnosis not present

## 2021-01-10 DIAGNOSIS — D2262 Melanocytic nevi of left upper limb, including shoulder: Secondary | ICD-10-CM | POA: Diagnosis not present

## 2021-01-10 DIAGNOSIS — D485 Neoplasm of uncertain behavior of skin: Secondary | ICD-10-CM | POA: Diagnosis not present

## 2021-01-10 MED ORDER — CITALOPRAM HYDROBROMIDE 20 MG PO TABS: 20.0000 mg | ORAL_TABLET | Freq: Every day | ORAL | 1 refills | Status: DC

## 2021-01-12 DIAGNOSIS — Z20822 Contact with and (suspected) exposure to covid-19: Secondary | ICD-10-CM | POA: Diagnosis not present

## 2021-02-07 ENCOUNTER — Institutional Professional Consult (permissible substitution): Payer: BC Managed Care – PPO | Admitting: Neurology

## 2021-02-27 ENCOUNTER — Ambulatory Visit: Payer: BC Managed Care – PPO | Admitting: Obstetrics & Gynecology

## 2021-02-27 ENCOUNTER — Telehealth (INDEPENDENT_AMBULATORY_CARE_PROVIDER_SITE_OTHER): Payer: BC Managed Care – PPO | Admitting: Internal Medicine

## 2021-02-27 DIAGNOSIS — U071 COVID-19: Secondary | ICD-10-CM | POA: Diagnosis not present

## 2021-02-27 DIAGNOSIS — Z20822 Contact with and (suspected) exposure to covid-19: Secondary | ICD-10-CM | POA: Diagnosis not present

## 2021-02-27 NOTE — Progress Notes (Signed)
Virtual Visit via Video Note  I connected with Misty Benson on 02/27/21 at  2:00 PM EDT by a video enabled telemedicine application and verified that I am speaking with the correct person using two identifiers.  Location patient: home Location provider: work office Persons participating in the virtual visit: patient, provider  I discussed the limitations of evaluation and management by telemedicine and the availability of in person appointments. The patient expressed understanding and agreed to proceed.   HPI: She has called today to report a positive COVID test.  Her symptoms started Sunday evening with cough, runny nose.  She denies fever although she has had some chills, her husband felt ill 4 days prior after a bout that has been going around the office.  Her oxygenation has been stable at 96 to 97%, she does not feel short of breath.   ROS: Constitutional: Denies fever, chills, diaphoresis, appetite change and fatigue.  HEENT: Denies photophobia, eye pain, redness, hearing loss, ear pain, congestion, sore throat, rhinorrhea, sneezing, mouth sores, trouble swallowing, neck pain, neck stiffness and tinnitus.   Respiratory: Denies SOB, DOE, cough, chest tightness,  and wheezing.   Cardiovascular: Denies chest pain, palpitations and leg swelling.  Gastrointestinal: Denies nausea, vomiting, abdominal pain, diarrhea, constipation, blood in stool and abdominal distention.  Genitourinary: Denies dysuria, urgency, frequency, hematuria, flank pain and difficulty urinating.  Endocrine: Denies: hot or cold intolerance, sweats, changes in hair or nails, polyuria, polydipsia. Musculoskeletal: Denies myalgias, back pain, joint swelling, arthralgias and gait problem.  Skin: Denies pallor, rash and wound.  Neurological: Denies dizziness, seizures, syncope, weakness, light-headedness, numbness and headaches.  Hematological: Denies adenopathy. Easy bruising, personal or family bleeding history   Psychiatric/Behavioral: Denies suicidal ideation, mood changes, confusion, nervousness, sleep disturbance and agitation   Past Medical History:  Diagnosis Date  . ADHD (attention deficit hyperactivity disorder)   . Anxiety   . Cancer ([place])    BASAL CELL FACE AND SHOULDERS , SQUAMOS CELL ON HAND AND MELANOMA LEFT ARM.Marland Kitchen.  . Cervix cancer ([place]) 1987   microinvasive  . Hypertension   . Leiomyoma 2013   multiple small  . Melanoma ([place])   . Obesity     Past Surgical History:  Procedure Laterality Date  . arm surgery     to remove melanoma on left upper outter arm  . BUNIONECTOMY     LEFT FOOT WITH PINS  . CERVICAL CONE BIOPSY  1987  . CESAREAN SECTION    . DILATION AND CURETTAGE OF UTERUS     x2  . FOOT SURGERY    . MELANOMA EXCISION    . TONSILLECTOMY     as a child    Family History  Problem Relation Age of Onset  . Stroke Mother   . Osteoporosis Mother   . Hyperlipidemia Father   . Atrial fibrillation Father   . Cancer Father        Prostate  . Breast cancer Maternal Grandmother 91    SOCIAL HX:   reports that she has never smoked. She has never used smokeless tobacco. She reports current alcohol use of about 4.0 standard drinks of alcohol per week. She reports that she does not use drugs.   Current Outpatient Medications:  .  acetaminophen (TYLENOL) 500 MG tablet, Take 1,000 mg by mouth every 6 (six) hours as needed for mild pain, moderate pain, fever or headache., Disp: , Rfl:  .  amphetamine-dextroamphetamine (ADDERALL) 20 MG tablet, Take 1 tablet (20 mg  total) by mouth 2 (two) times daily., Disp: 60 tablet, Rfl: 0 .  amphetamine-dextroamphetamine (ADDERALL) 20 MG tablet, Take 1 tablet (20 mg total) by mouth 2 (two) times daily., Disp: 60 tablet, Rfl: 0 .  amphetamine-dextroamphetamine (ADDERALL) 20 MG tablet, Take 1 tablet (20 mg total) by mouth 2 (two) times daily., Disp: 60 tablet, Rfl: 0 .  citalopram (CELEXA) 20 MG tablet, Take 1 tablet (20 mg total) by  mouth daily., Disp: 90 tablet, Rfl: 1 .  cyclobenzaprine (FLEXERIL) 5 MG tablet, Take 1-2 tablets (5-10 mg total) by mouth 2 (two) times daily as needed for muscle spasms. (Patient not taking: Reported on 01/05/2021), Disp: 30 tablet, Rfl: 0 .  diclofenac (VOLTAREN) 50 MG EC tablet, Take 50 mg by mouth 2 (two) times daily as needed., Disp: , Rfl:  .  hydrochlorothiazide (MICROZIDE) 12.5 MG capsule, TAKE 1 CAPSULE(12.5 MG) BY MOUTH DAILY, Disp: 90 capsule, Rfl: 1 .  ibuprofen (ADVIL,MOTRIN) 200 MG tablet, Take 3-4 tablets (600-800 mg total) by mouth every 6 (six) hours as needed for fever, headache, mild pain, moderate pain or cramping., Disp: 30 tablet, Rfl: 6 .  LORazepam (ATIVAN) 0.5 MG tablet, Take 1 tablet (0.5 mg total) by mouth 2 (two) times daily as needed for anxiety. (Patient not taking: Reported on 01/05/2021), Disp: 30 tablet, Rfl: 0 .  verapamil (CALAN-SR) 240 MG CR tablet, TAKE 1 TABLET(240 MG) BY MOUTH DAILY, Disp: 90 tablet, Rfl: 1  EXAM:   VITALS per patient if applicable: Blood pressure 136/73, heart rate 75, O2 sats 96 to 97% on room air.  GENERAL: alert, oriented, appears well and in no acute distress  HEENT: atraumatic, conjunttiva clear, no obvious abnormalities on inspection of external nose and ears  NECK: normal movements of the head and neck  LUNGS: on inspection no signs of respiratory distress, breathing rate appears normal, no obvious gross increased work of breathing, gasping or wheezing  CV: no obvious cyanosis  MS: moves all visible extremities without noticeable abnormality  PSYCH/NEURO: pleasant and cooperative, no obvious depression or anxiety, speech and thought processing grossly intact  ASSESSMENT AND PLAN:   COVID-19  - Plan: Ambulatory referral for Covid Treatment -Peers to be relatively mild presentation at this point. -Have advised OTC pain relievers, antihistamines, decongestants as needed. -She knows to reach out to me if further  concerns.     I discussed the assessment and treatment plan with the patient. The patient was provided an opportunity to ask questions and all were answered. The patient agreed with the plan and demonstrated an understanding of the instructions.   The patient was advised to call back or seek an in-person evaluation if the symptoms worsen or if the condition fails to improve as anticipated.    Lelon Frohlich, MD  [facility]

## 2021-02-28 ENCOUNTER — Telehealth: Payer: Self-pay | Admitting: *Deleted

## 2021-02-28 ENCOUNTER — Other Ambulatory Visit (HOSPITAL_COMMUNITY): Payer: Self-pay

## 2021-02-28 ENCOUNTER — Other Ambulatory Visit: Payer: Self-pay

## 2021-02-28 ENCOUNTER — Ambulatory Visit (INDEPENDENT_AMBULATORY_CARE_PROVIDER_SITE_OTHER): Payer: BC Managed Care – PPO

## 2021-02-28 ENCOUNTER — Telehealth: Payer: Self-pay

## 2021-02-28 ENCOUNTER — Encounter: Payer: Self-pay | Admitting: Oncology

## 2021-02-28 ENCOUNTER — Other Ambulatory Visit: Payer: Self-pay | Admitting: Oncology

## 2021-02-28 VITALS — BP 126/76 | HR 73 | Temp 98.8°F | Resp 16

## 2021-02-28 DIAGNOSIS — U071 COVID-19: Secondary | ICD-10-CM

## 2021-02-28 MED ORDER — METHYLPREDNISOLONE SODIUM SUCC 125 MG IJ SOLR: 125.0000 mg | Freq: Once | INTRAMUSCULAR | Status: AC | PRN

## 2021-02-28 MED ORDER — FAMOTIDINE IN NACL 20-0.9 MG/50ML-% IV SOLN: 20.0000 mg | Freq: Once | INTRAVENOUS | Status: AC | PRN

## 2021-02-28 MED ORDER — DIPHENHYDRAMINE HCL 50 MG/ML IJ SOLN: 50.0000 mg | Freq: Once | INTRAMUSCULAR | Status: AC | PRN

## 2021-02-28 MED ORDER — MOLNUPIRAVIR EUA 200MG CAPSULE
4.0000 | ORAL_CAPSULE | Freq: Two times a day (BID) | ORAL | 0 refills | Status: AC
  Filled 2021-02-28: qty 40, 5d supply, fill #0

## 2021-02-28 MED ORDER — EPINEPHRINE 0.3 MG/0.3ML IJ SOAJ: 0.3000 mg | Freq: Once | INTRAMUSCULAR | Status: AC | PRN

## 2021-02-28 MED ORDER — SODIUM CHLORIDE 0.9 % IV SOLN
INTRAVENOUS | Status: DC | PRN
Start: 2021-02-28 — End: 2022-02-19

## 2021-02-28 MED ORDER — BEBTELOVIMAB 175 MG/2 ML IV (EUA)
175.0000 mg | Freq: Once | INTRAMUSCULAR | Status: AC
  Administered 2021-02-28: 175 mg via INTRAVENOUS

## 2021-02-28 MED ORDER — ALBUTEROL SULFATE HFA 108 (90 BASE) MCG/ACT IN AERS: 2.0000 | INHALATION_SPRAY | Freq: Once | RESPIRATORY_TRACT | Status: AC | PRN

## 2021-02-28 NOTE — Progress Notes (Signed)
I connected by phone with Misty Benson on 02/28/2021 at 1:15 PM to discuss the potential use of a new treatment for mild to moderate COVID-19 viral infection in non-hospitalized patients.  This patient is a 60 y.o. female that meets the FDA criteria for Emergency Use Authorization of COVID monoclonal antibody bebtelovimab.  Has a (+) direct SARS-CoV-2 viral test result  Has mild or moderate COVID-19   Is NOT hospitalized due to COVID-19  Is within 10 days of symptom onset  Has at least one of the high risk factor(s) for progression to severe COVID-19 and/or hospitalization as defined in EUA.  Specific high risk criteria : Older age (>/= 61 yo), BMI > 25 and Chronic Kidney Disease (CKD)   I have spoken and communicated the following to the patient or parent/caregiver regarding COVID monoclonal antibody treatment:  1. FDA has authorized the emergency use for the treatment of mild to moderate COVID-19 in adults and pediatric patients with positive results of direct SARS-CoV-2 viral testing who are 87 years of age and older weighing at least 40 kg, and who are at high risk for progressing to severe COVID-19 and/or hospitalization.  2. The significant known and potential risks and benefits of COVID monoclonal antibody, and the extent to which such potential risks and benefits are unknown.  3. Information on available alternative treatments and the risks and benefits of those alternatives, including clinical trials.  4. Patients treated with COVID monoclonal antibody should continue to self-isolate and use infection control measures (e.g., wear mask, isolate, social distance, avoid sharing personal items, clean and disinfect "high touch" surfaces, and frequent handwashing) according to CDC guidelines.   5. The patient or parent/caregiver has the option to accept or refuse COVID monoclonal antibody treatment.  6. Discussion about the monoclonal antibody infusion does not ensure treatment. The  patient will be placed on a list and scheduled according to risk, symptom onset and availability. A scheduler will reach to the patient to let them know if we can accommodate their infusion or not.  After reviewing this information with the patient, the patient has agreed to receive one of the available covid 19 monoclonal antibodies and will be provided an appropriate fact sheet prior to infusion. Jacquelin Hawking, NP 02/28/2021 1:15 PM   Outpatient Oral COVID Treatment Note  I connected with Misty Benson on 02/28/2021/1:17 PM by telephone and verified that I am speaking with the correct person using two identifiers.  I discussed the limitations, risks, security, and privacy concerns of performing an evaluation and management service by telephone and the availability of in person appointments. I also discussed with the patient that there may be a patient responsible charge related to this service. The patient expressed understanding and agreed to proceed.  Patient location: Home Provider location: Clinic   Diagnosis: COVID-19 infection  Purpose of visit: Discussion of potential use of Molnupiravir or Paxlovid, a new treatment for mild to moderate COVID-19 viral infection in non-hospitalized patients.   Subjective: Patient is a 60 y.o. female who has been diagnosed with COVID 19 viral infection.  Their symptoms began on 02/25/21.  Past Medical History:  Diagnosis Date  . ADHD (attention deficit hyperactivity disorder)   . Anxiety   . Cancer ([place])    BASAL CELL FACE AND SHOULDERS , SQUAMOS CELL ON HAND AND MELANOMA LEFT ARM.Marland Kitchen.  . Cervix cancer ([place]) 1987   microinvasive  . Hypertension   . Leiomyoma 2013   multiple small  . Melanoma ([place])   .  Obesity     No Known Allergies   Current Outpatient Medications:  .  acetaminophen (TYLENOL) 500 MG tablet, Take 1,000 mg by mouth every 6 (six) hours as needed for mild pain, moderate pain, fever or headache., Disp: , Rfl:  .   amphetamine-dextroamphetamine (ADDERALL) 20 MG tablet, Take 1 tablet (20 mg total) by mouth 2 (two) times daily., Disp: 60 tablet, Rfl: 0 .  amphetamine-dextroamphetamine (ADDERALL) 20 MG tablet, Take 1 tablet (20 mg total) by mouth 2 (two) times daily., Disp: 60 tablet, Rfl: 0 .  amphetamine-dextroamphetamine (ADDERALL) 20 MG tablet, Take 1 tablet (20 mg total) by mouth 2 (two) times daily., Disp: 60 tablet, Rfl: 0 .  citalopram (CELEXA) 20 MG tablet, Take 1 tablet (20 mg total) by mouth daily., Disp: 90 tablet, Rfl: 1 .  cyclobenzaprine (FLEXERIL) 5 MG tablet, Take 1-2 tablets (5-10 mg total) by mouth 2 (two) times daily as needed for muscle spasms. (Patient not taking: Reported on 01/05/2021), Disp: 30 tablet, Rfl: 0 .  diclofenac (VOLTAREN) 50 MG EC tablet, Take 50 mg by mouth 2 (two) times daily as needed., Disp: , Rfl:  .  hydrochlorothiazide (MICROZIDE) 12.5 MG capsule, TAKE 1 CAPSULE(12.5 MG) BY MOUTH DAILY, Disp: 90 capsule, Rfl: 1 .  ibuprofen (ADVIL,MOTRIN) 200 MG tablet, Take 3-4 tablets (600-800 mg total) by mouth every 6 (six) hours as needed for fever, headache, mild pain, moderate pain or cramping., Disp: 30 tablet, Rfl: 6 .  LORazepam (ATIVAN) 0.5 MG tablet, Take 1 tablet (0.5 mg total) by mouth 2 (two) times daily as needed for anxiety. (Patient not taking: Reported on 01/05/2021), Disp: 30 tablet, Rfl: 0 .  verapamil (CALAN-SR) 240 MG CR tablet, TAKE 1 TABLET(240 MG) BY MOUTH DAILY, Disp: 90 tablet, Rfl: 1  Objective: Patient sounds stable.  They are in no apparent distress.  Breathing is non labored.  Mood and behavior are normal.  Laboratory Data:  No results found for this or any previous visit (from the past 2160 hour(s)).   Assessment: 60 y.o. female with mild/moderate COVID 19 viral infection diagnosed on 02/25/21 at high risk for progression to severe COVID 19.  Plan:  This patient is a 60 y.o. female that meets the following criteria for Emergency Use Authorization of:  Molnupiravir  1. Age >18 yr 2. SARS-COV-2 positive test 3. Symptom onset < 5 days 4. Mild-to-moderate COVID disease with high risk for severe progression to hospitalization or death   I have spoken and communicated the following to the patient or parent/caregiver regarding: 1. Molnupiravir is an unapproved drug that is authorized for use under an [employer].  2. There are no adequate, approved, available products for the treatment of COVID-19 in adults who have mild-to-moderate COVID-19 and are at high risk for progressing to severe COVID-19, including hospitalization or death. 3. Other therapeutics are currently authorized. For additional information on all products authorized for treatment or prevention of COVID-19, please see [employer].  4. There are benefits and risks of taking this treatment as outlined in the "Fact Sheet for Patients and Caregivers."  5. "Fact Sheet for Patients and Caregivers" was reviewed with patient. A hard copy will be provided to patient from pharmacy prior to the patient receiving treatment. 6. Patients should continue to self-isolate and use infection control measures (e.g., wear mask, isolate, social distance, avoid sharing personal items, clean and disinfect "high touch" surfaces, and frequent handwashing) according to CDC guidelines.  7. The patient or parent/caregiver has the option to  accept or refuse treatment. 8. [place] has established a pregnancy surveillance program. 9. Females of childbearing potential should use a reliable method of contraception correctly and consistently, as applicable, for the duration of treatment and for 4 days after the last dose of Molnupiravir. 80. Males of reproductive potential who are sexually active with females of childbearing potential should use a reliable method of contraception  correctly and consistently during treatment and for at least 3 months after the last dose. 11. Pregnancy status and risk was assessed. Patient verbalized understanding of precautions.   After reviewing above information with the patient, the patient agrees to receive molnupiravir.  Follow up instructions:    . Take prescription BID x 5 days as directed . Reach out to pharmacist for counseling on medication if desired . For concerns regarding further COVID symptoms please follow up with your PCP or urgent care . For urgent or life-threatening issues, seek care at your local emergency department  The patient was provided an opportunity to ask questions, and all were answered. The patient agreed with the plan and demonstrated an understanding of the instructions.   Script sent to [facility] and opted to pick up RX.  The patient was advised to call their PCP or seek an in-person evaluation if the symptoms worsen or if the condition fails to improve as anticipated.   I provided 15 minutes of non face-to-face telephone visit time during this encounter, and > 50% was spent counseling as documented under my assessment & plan.  Jacquelin Hawking, NP 02/28/2021 /1:17 PM

## 2021-02-28 NOTE — Patient Instructions (Signed)
10 Things You Can Do to Manage Your COVID-19 Symptoms at Home If you have possible or confirmed COVID-19: 1. Stay home except to get medical care. 2. Monitor your symptoms carefully. If your symptoms get worse, call your healthcare provider immediately. 3. Get rest and stay hydrated. 4. If you have a medical appointment, call the healthcare provider ahead of time and tell them that you have or may have COVID-19. 5. For medical emergencies, call 911 and notify the dispatch personnel that you have or may have COVID-19. 6. Cover your cough and sneezes with a tissue or use the inside of your elbow. 7. Wash your hands often with soap and water for at least 20 seconds or clean your hands with an alcohol-based hand sanitizer that contains at least 60% alcohol. 8. As much as possible, stay in a specific room and away from other people in your home. Also, you should use a separate bathroom, if available. If you need to be around other people in or outside of the home, wear a mask. 9. Avoid sharing personal items with other people in your household, like dishes, towels, and bedding. 10. Clean all surfaces that are touched often, like counters, tabletops, and doorknobs. Use household cleaning sprays or wipes according to the label instructions. cdc.gov/coronavirus 05/26/2020 This information is not intended to replace advice given to you by your health care provider. Make sure you discuss any questions you have with your health care provider. Document Revised: 09/11/2020 Document Reviewed: 09/11/2020 Elsevier Patient Education  2021 Elsevier Inc.  What types of side effects do monoclonal antibody drugs cause?  Common side effects  In general, the more common side effects caused by monoclonal antibody drugs include: . Allergic reactions, such as hives or itching . Flu-like signs and symptoms, including chills, fatigue, fever, and muscle aches and pains . Nausea, vomiting . Diarrhea . Skin  rashes . Low blood pressure   The CDC is recommending patients who receive monoclonal antibody treatments wait at least 90 days before being vaccinated.  Currently, there are no data on the safety and efficacy of mRNA COVID-19 vaccines in persons who received monoclonal antibodies or convalescent plasma as part of COVID-19 treatment. Based on the estimated half-life of such therapies as well as evidence suggesting that reinfection is uncommon in the 90 days after initial infection, vaccination should be deferred for at least 90 days, as a precautionary measure until additional information becomes available, to avoid interference of the antibody treatment with vaccine-induced immune responses.   If someone you know is interested in receiving treatment please have them contact their MD for a referral or visit [email or web address]    

## 2021-02-28 NOTE — Telephone Encounter (Signed)
Called to discuss with patient about COVID-19 symptoms and the use of one of the available treatments for those with mild to moderate Covid symptoms and at a high risk of hospitalization.  Pt appears to qualify for outpatient treatment due to co-morbid conditions and/or a member of an at-risk group in accordance with the FDA Emergency Use Authorization.    Symptom onset: 02/25/21 Cough,chills,runny nose Vaccinated: Yes Booster? Yes Immunocompromised? No Qualifiers: HTN,Obesity  Unable to reach pt - Left message and call back number [phone].   Marcello Moores

## 2021-02-28 NOTE — Telephone Encounter (Signed)
Patient called the triage nurse complaining of "pain under her rib area, fever, cough and positive Covid.  She states she is slightly short of breath".  Spoke with patient and she is complaining of right sided rib pain with a cough.  She does have some shortness of breath with wheezing.   SPO2 97% Temp is now normal  Information given to patient about Ambulatory referral for Covid Treatment. Also advised patient to go to the ED per Dr Jerilee Hoh if shortness of breath increases or does not improve.   Patient will call back if needed

## 2021-02-28 NOTE — Progress Notes (Signed)
Diagnosis: COVID  Provider:  Marshell Garfinkel, MD  Procedure: Infusion  IV Type: Peripheral, IV Location: L Antecubital  Bebtelovimab, Dose: 175mg   Infusion Start Time: 1600  Infusion Stop Time: 1700  Post Infusion IV Care: Observation period completed and Peripheral IV Discontinued  Discharge: Condition: Good, Destination: Home . AVS provided to patient.   Performed by:  Koren Shiver, RN

## 2021-03-01 ENCOUNTER — Other Ambulatory Visit: Payer: Self-pay | Admitting: Oncology

## 2021-03-01 MED ORDER — ONDANSETRON HCL 4 MG PO TABS: 4.0000 mg | ORAL_TABLET | Freq: Three times a day (TID) | ORAL | 0 refills | Status: DC | PRN

## 2021-03-07 ENCOUNTER — Ambulatory Visit: Payer: BC Managed Care – PPO | Admitting: Obstetrics & Gynecology

## 2021-03-09 ENCOUNTER — Other Ambulatory Visit: Payer: Self-pay | Admitting: Internal Medicine

## 2021-03-09 DIAGNOSIS — F900 Attention-deficit hyperactivity disorder, predominantly inattentive type: Secondary | ICD-10-CM

## 2021-03-14 ENCOUNTER — Other Ambulatory Visit: Payer: Self-pay | Admitting: Internal Medicine

## 2021-03-14 DIAGNOSIS — F900 Attention-deficit hyperactivity disorder, predominantly inattentive type: Secondary | ICD-10-CM

## 2021-03-14 MED ORDER — AMPHETAMINE-DEXTROAMPHETAMINE 20 MG PO TABS: 20.0000 mg | ORAL_TABLET | Freq: Two times a day (BID) | ORAL | 0 refills | Status: DC

## 2021-03-27 ENCOUNTER — Other Ambulatory Visit: Payer: Self-pay | Admitting: Obstetrics & Gynecology

## 2021-03-27 DIAGNOSIS — Z1231 Encounter for screening mammogram for malignant neoplasm of breast: Secondary | ICD-10-CM

## 2021-03-30 ENCOUNTER — Other Ambulatory Visit: Payer: Self-pay

## 2021-03-30 ENCOUNTER — Ambulatory Visit
Admission: RE | Admit: 2021-03-30 | Discharge: 2021-03-30 | Disposition: A | Discharge status: Home / Self Care | Payer: BC Managed Care – PPO | Source: Ambulatory Visit | Attending: Obstetrics & Gynecology | Admitting: Obstetrics & Gynecology

## 2021-03-30 DIAGNOSIS — Z1231 Encounter for screening mammogram for malignant neoplasm of breast: Secondary | ICD-10-CM | POA: Diagnosis not present

## 2021-04-05 ENCOUNTER — Other Ambulatory Visit: Payer: Self-pay

## 2021-04-05 ENCOUNTER — Ambulatory Visit (INDEPENDENT_AMBULATORY_CARE_PROVIDER_SITE_OTHER): Payer: BC Managed Care – PPO | Admitting: Bariatrics

## 2021-04-05 ENCOUNTER — Encounter (INDEPENDENT_AMBULATORY_CARE_PROVIDER_SITE_OTHER): Payer: Self-pay | Admitting: Bariatrics

## 2021-04-05 VITALS — BP 136/85 | HR 63 | Temp 98.3°F | Ht 68.0 in | Wt 249.0 lb

## 2021-04-05 DIAGNOSIS — Z0289 Encounter for other administrative examinations: Secondary | ICD-10-CM

## 2021-04-05 DIAGNOSIS — E559 Vitamin D deficiency, unspecified: Secondary | ICD-10-CM

## 2021-04-05 DIAGNOSIS — Z1331 Encounter for screening for depression: Secondary | ICD-10-CM

## 2021-04-05 DIAGNOSIS — E7849 Other hyperlipidemia: Secondary | ICD-10-CM | POA: Diagnosis not present

## 2021-04-05 DIAGNOSIS — Z9189 Other specified personal risk factors, not elsewhere classified: Secondary | ICD-10-CM

## 2021-04-05 DIAGNOSIS — I10 Essential (primary) hypertension: Secondary | ICD-10-CM

## 2021-04-05 DIAGNOSIS — R7309 Other abnormal glucose: Secondary | ICD-10-CM

## 2021-04-05 DIAGNOSIS — R0602 Shortness of breath: Secondary | ICD-10-CM | POA: Diagnosis not present

## 2021-04-05 DIAGNOSIS — R5383 Other fatigue: Secondary | ICD-10-CM

## 2021-04-05 DIAGNOSIS — Z6837 Body mass index (BMI) 37.0-37.9, adult: Secondary | ICD-10-CM

## 2021-04-06 LAB — VITAMIN D 25 HYDROXY (VIT D DEFICIENCY, FRACTURES): Vit D, 25-Hydroxy: 41.1 ng/mL (ref 30.0–100.0)

## 2021-04-06 LAB — COMPREHENSIVE METABOLIC PANEL
ALT: 24 IU/L (ref 0–32)
AST: 16 IU/L (ref 0–40)
Albumin/Globulin Ratio: 1.7 (ref 1.2–2.2)
Albumin: 4.2 g/dL (ref 3.8–4.9)
Alkaline Phosphatase: 83 IU/L (ref 44–121)
BUN/Creatinine Ratio: 35 — ABNORMAL HIGH (ref 12–28)
BUN: 23 mg/dL (ref 8–27)
Bilirubin Total: 0.3 mg/dL (ref 0.0–1.2)
CO2: 23 mmol/L (ref 20–29)
Calcium: 9.9 mg/dL (ref 8.7–10.3)
Chloride: 105 mmol/L (ref 96–106)
Creatinine, Ser: 0.66 mg/dL (ref 0.57–1.00)
Globulin, Total: 2.5 g/dL (ref 1.5–4.5)
Glucose: 92 mg/dL (ref 65–99)
Potassium: 4.8 mmol/L (ref 3.5–5.2)
Sodium: 145 mmol/L — ABNORMAL HIGH (ref 134–144)
Total Protein: 6.7 g/dL (ref 6.0–8.5)
eGFR: 100 mL/min/{1.73_m2} (ref >59)

## 2021-04-06 LAB — LIPID PANEL WITH LDL/HDL RATIO
Cholesterol, Total: 227 mg/dL — ABNORMAL HIGH (ref 100–199)
HDL: 73 mg/dL (ref >39)
LDL Chol Calc (NIH): 132 mg/dL — ABNORMAL HIGH (ref 0–99)
LDL/HDL Ratio: 1.8 ratio (ref 0.0–3.2)
Triglycerides: 125 mg/dL (ref 0–149)
VLDL Cholesterol Cal: 22 mg/dL (ref 5–40)

## 2021-04-06 LAB — HEMOGLOBIN A1C
Est. average glucose Bld gHb Est-mCnc: 120 mg/dL
Hgb A1c MFr Bld: 5.8 % — ABNORMAL HIGH (ref 4.8–5.6)

## 2021-04-06 LAB — TSH: TSH: 2.22 u[IU]/mL (ref 0.450–4.500)

## 2021-04-06 LAB — T4, FREE: Free T4: 1.18 ng/dL (ref 0.82–1.77)

## 2021-04-06 LAB — T3: T3, Total: 120 ng/dL (ref 71–180)

## 2021-04-06 LAB — INSULIN, RANDOM: INSULIN: 9.5 u[IU]/mL (ref 2.6–24.9)

## 2021-04-10 ENCOUNTER — Encounter (INDEPENDENT_AMBULATORY_CARE_PROVIDER_SITE_OTHER): Payer: Self-pay | Admitting: Bariatrics

## 2021-04-10 DIAGNOSIS — R7303 Prediabetes: Secondary | ICD-10-CM | POA: Insufficient documentation

## 2021-04-12 NOTE — Progress Notes (Signed)
Chief Complaint:   OBESITY Misty Benson (MR# [id number]) is a 60 y.o. female who presents for evaluation and treatment of obesity and related comorbidities. Current BMI is Body mass index is 37.86 kg/m. Misty Benson has been struggling with her weight for many years and has been unsuccessful in either losing weight, maintaining weight loss, or reaching her healthy weight goal.  Misty Benson is currently in the action stage of change and ready to dedicate time achieving and maintaining a healthier weight. Misty Benson is interested in becoming our patient and working on intensive lifestyle modifications including (but not limited to) diet and exercise for weight loss.  Misty Benson's habits were reviewed today and are as follows: Her family eats meals together, she thinks her family will eat healthier with her, her desired weight loss is 74 lbs, she started gaining weight after first pregnancy, her heaviest weight ever was 256 pounds, she has significant food cravings issues, she snacks frequently in the evenings, she skips meals frequently, she is frequently drinking liquids with calories, she has problems with excessive hunger, she frequently eats larger portions than normal and she struggles with emotional eating.  Depression Screen Misty Benson's Food and Mood (modified PHQ-9) score was 9.  Depression screen [facility] 2/9 04/05/2021  Decreased Interest 1  Down, Depressed, Hopeless 1  PHQ - 2 Score 2  Altered sleeping 0  Tired, decreased energy 0  Change in appetite 1  Feeling bad or failure about yourself  3  Trouble concentrating 2  Moving slowly or fidgety/restless 1  Suicidal thoughts 0  PHQ-9 Score 9  Difficult doing work/chores Not difficult at all   Subjective:   1. Other fatigue Misty Benson admits to daytime somnolence and admits to waking up still tired. Patent has a history of symptoms of daytime fatigue. Misty Benson generally gets 9 or 10 hours of sleep per night, and states that she has generally restful sleep.  Snoring is present. Apneic episodes are not present. Epworth Sleepiness Score is 1.  2. SOB (shortness of breath) on exertion Misty Benson notes increasing shortness of breath with exercising and seems to be worsening over time with weight gain. She notes getting out of breath sooner with activity than she used to. This has not gotten worse recently. Misty Benson denies shortness of breath at rest or orthopnea.  3. Other hyperlipidemia Misty Benson needs labs today.  4. Essential hypertension Misty Benson is taking Verapamil and hydrochlorothiazide.  5. Vitamin D deficiency Misty Benson is taking Vit D 5,000 IU.  6. Elevated glucose Misty Benson needs labs today.  7. At risk for activity intolerance Misty Benson is at risk for exercise intolerance due to obesity and back pain.  Assessment/Plan:   1. Other fatigue Misty Benson does feel that her weight is causing her energy to be lower than it should be. Fatigue may be related to obesity, depression or many other causes. Labs will be ordered, and in the meanwhile, Misty Benson will focus on self care including making healthy food choices, increasing physical activity and focusing on stress reduction.  - EKG 12-Lead - Comprehensive metabolic panel - Lipid Panel With LDL/HDL Ratio - T3 - T4, free - TSH - Lipid Panel With LDL/HDL Ratio - Hemoglobin A1c - Insulin, random - VITAMIN D 25 Hydroxy (Vit-D Deficiency, Fractures)  2. SOB (shortness of breath) on exertion Misty Benson does feel that she gets out of breath more easily that she used to when she exercises. Misty Benson's shortness of breath appears to be obesity related and exercise induced. She has agreed to work  on weight loss and gradually increase exercise to treat her exercise induced shortness of breath. Will continue to monitor closely.  3. Other hyperlipidemia Cardiovascular risk and specific lipid/LDL goals reviewed. We discussed several lifestyle modifications today. We will check labs today. Misty Benson will continue to work on diet, exercise and  weight loss efforts. Orders and follow up as documented in patient record.   Counseling Intensive lifestyle modifications are the first line treatment for this issue. . Dietary changes: Increase soluble fiber. Decrease simple carbohydrates. . Exercise changes: Moderate to vigorous-intensity aerobic activity 150 minutes per week if tolerated. . Lipid-lowering medications: see documented in medical record.  - Lipid Panel With LDL/HDL Ratio  4. Essential hypertension Misty Benson will continue her medications and will work on healthy weight loss and exercise to improve blood pressure control. We will watch for signs of hypotension as she continues her lifestyle modifications.  5. Vitamin D deficiency Low Vitamin D level contributes to fatigue and are associated with obesity, breast, and colon cancer. We will check labs today. Misty Benson will follow-up for routine testing of Vitamin D, at least 2-3 times per year to avoid over-replacement.  - VITAMIN D 25 Hydroxy (Vit-D Deficiency, Fractures)  6. Elevated glucose We will check labs today. Misty Benson will continue to follow up as directed. Good blood sugar control is important to decrease the likelihood of diabetic complications such as nephropathy, neuropathy, limb loss, blindness, coronary artery disease, and death. Intensive lifestyle modification including diet, exercise and weight loss are the first line of treatment for diabetes.   - Hemoglobin A1c - Insulin, random  7. Depression screen Misty Benson had a positive depression screening. Depression is commonly associated with obesity and often results in emotional eating behaviors. We will monitor this closely and work on CBT to help improve the non-hunger eating patterns. Referral to Psychology may be required if no improvement is seen as she continues in our clinic.  8. At risk for activity intolerance Misty Benson was given approximately 15 minutes of exercise intolerance counseling today. She is 60 y.o. female and  has risk factors exercise intolerance including obesity. We discussed intensive lifestyle modifications today with an emphasis on specific weight loss instructions and strategies. Jamariyah will slowly increase activity as tolerated.  Repetitive spaced learning was employed today to elicit superior memory formation and behavioral change.  9. Obesity, current BMI 70 Misty Benson is currently in the action stage of change and her goal is to continue with weight loss efforts. I recommend Misty Benson begin the structured treatment plan as follows:  She has agreed to the Category 2 Plan.  No sugary drinks.  Exercise goals: No exercise has been prescribed at this time.   Behavioral modification strategies: increasing lean protein intake, decreasing simple carbohydrates, increasing vegetables, increasing water intake, decreasing eating out, no skipping meals, meal planning and cooking strategies, keeping healthy foods in the home and planning for success.  She was informed of the importance of frequent follow-up visits to maximize her success with intensive lifestyle modifications for her multiple health conditions. She was informed we would discuss her lab results at her next visit unless there is a critical issue that needs to be addressed sooner. Misty Benson agreed to keep her next visit at the agreed upon time to discuss these results.  Objective:   Blood pressure 136/85, pulse 63, temperature 98.3 F (36.8 C), height 5\' 8"  (1.727 m), weight 249 lb (112.9 kg), last menstrual period 05/30/2012, SpO2 92 %. Body mass index is 37.86 kg/m.  EKG: Normal  sinus rhythm, rate 67 BPM.  Indirect Calorimeter completed today shows a VO2 of 271 and a REE of 1884.  Her calculated basal metabolic rate is 2620 thus her basal metabolic rate is better than expected.  General: Cooperative, alert, well developed, in no acute distress. HEENT: Conjunctivae and lids unremarkable. Cardiovascular: Regular rhythm.  Lungs: Normal work of  breathing. Neurologic: No focal deficits.   Lab Results  Component Value Date   CREATININE 0.66 04/05/2021   BUN 23 04/05/2021   NA 145 (H) 04/05/2021   K 4.8 04/05/2021   CL 105 04/05/2021   CO2 23 04/05/2021   Lab Results  Component Value Date   ALT 24 04/05/2021   AST 16 04/05/2021   ALKPHOS 83 04/05/2021   BILITOT 0.3 04/05/2021   Lab Results  Component Value Date   HGBA1C 5.8 (H) 04/05/2021   Lab Results  Component Value Date   INSULIN 9.5 04/05/2021   Lab Results  Component Value Date   TSH 2.220 04/05/2021   Lab Results  Component Value Date   CHOL 227 (H) 04/05/2021   HDL 73 04/05/2021   LDLCALC 132 (H) 04/05/2021   TRIG 125 04/05/2021   CHOLHDL 3.4 02/23/2020   Lab Results  Component Value Date   WBC 5.4 02/23/2020   HGB 13.3 02/23/2020   HCT 40.3 02/23/2020   MCV 93.1 02/23/2020   PLT 280 02/23/2020   No results found for: IRON, TIBC, FERRITIN  Attestation Statements:   Reviewed by clinician on day of visit: allergies, medications, problem list, medical history, surgical history, family history, social history, and previous encounter notes.   Wilhemena Durie, am acting as Location manager for [employer], DO.  I have reviewed the above documentation for accuracy and completeness, and I agree with the above. Jearld Lesch, DO

## 2021-04-16 DIAGNOSIS — M19012 Primary osteoarthritis, left shoulder: Secondary | ICD-10-CM | POA: Diagnosis not present

## 2021-04-16 DIAGNOSIS — G8929 Other chronic pain: Secondary | ICD-10-CM | POA: Diagnosis not present

## 2021-04-16 DIAGNOSIS — M25512 Pain in left shoulder: Secondary | ICD-10-CM | POA: Diagnosis not present

## 2021-04-17 ENCOUNTER — Encounter (INDEPENDENT_AMBULATORY_CARE_PROVIDER_SITE_OTHER): Payer: Self-pay | Admitting: Bariatrics

## 2021-04-18 ENCOUNTER — Other Ambulatory Visit: Payer: Self-pay | Admitting: Internal Medicine

## 2021-04-18 DIAGNOSIS — F900 Attention-deficit hyperactivity disorder, predominantly inattentive type: Secondary | ICD-10-CM

## 2021-04-18 DIAGNOSIS — I10 Essential (primary) hypertension: Secondary | ICD-10-CM

## 2021-04-19 ENCOUNTER — Encounter (INDEPENDENT_AMBULATORY_CARE_PROVIDER_SITE_OTHER): Payer: Self-pay | Admitting: Bariatrics

## 2021-04-19 ENCOUNTER — Ambulatory Visit (INDEPENDENT_AMBULATORY_CARE_PROVIDER_SITE_OTHER): Payer: BC Managed Care – PPO | Admitting: Bariatrics

## 2021-04-19 ENCOUNTER — Other Ambulatory Visit: Payer: Self-pay

## 2021-04-19 VITALS — BP 130/72 | HR 70 | Temp 99.4°F | Ht 68.0 in | Wt 243.0 lb

## 2021-04-19 DIAGNOSIS — Z9189 Other specified personal risk factors, not elsewhere classified: Secondary | ICD-10-CM | POA: Diagnosis not present

## 2021-04-19 DIAGNOSIS — Z6837 Body mass index (BMI) 37.0-37.9, adult: Secondary | ICD-10-CM

## 2021-04-19 DIAGNOSIS — I10 Essential (primary) hypertension: Secondary | ICD-10-CM | POA: Diagnosis not present

## 2021-04-19 DIAGNOSIS — R7303 Prediabetes: Secondary | ICD-10-CM | POA: Diagnosis not present

## 2021-04-25 NOTE — Progress Notes (Signed)
Chief Complaint:   OBESITY Misty Benson is here to discuss her progress with her obesity treatment plan along with follow-up of her obesity related diagnoses. [name] is on the Category 2 Plan and states she is following her eating plan approximately 80% of the time. [name] states she is not currently exercising at this time.    Today's visit was #: 2 Starting weight: 249 lbs   Starting date: 04/05/2021  Today's weight: 243 lbs Today's date: 04/19/2021 Total lbs lost to date: 6 lbs Total lbs lost since last in-office visit: 6 lbs  Interim History: Misty Benson is down 6 lbs since her last visit.  It was hard at times. She is not drinking enough water.  Subjective:   1. Prediabetes [name] has a diagnosis of prediabetes based on her elevated HgA1c and was informed this puts her at greater risk of developing diabetes. She continues to work on diet and exercise to decrease her risk of diabetes. She denies nausea or hypoglycemia. She is not currently taking any medication.  Lab Results  Component Value Date   HGBA1C 5.8 (H) 04/05/2021   Lab Results  Component Value Date   INSULIN 9.5 04/05/2021    2. Essential hypertension Review: taking medications as instructed, no medication side effects noted, no chest pain on exertion, no dyspnea on exertion, no swelling of ankles. Her HTN is controlled with Hydrochlorothiazide and CALAN-SR.  BP Readings from Last 3 Encounters:  04/19/21 130/72  04/05/21 136/85  02/28/21 131/81   3. At risk for diabetes mellitus [name] is at higher than average risk for developing diabetes due to obesity and prediabetes.    Assessment/Plan:   1. Prediabetes Misty Benson will continue to work on weight loss, exercise, and decreasing simple carbohydrates to help decrease the risk of diabetes.  Misty Benson will decrease carbohydrates, increase healthy fats and protein, and increase exercise/activities.  2. Essential hypertension Misty Benson is working on healthy weight loss and exercise to  improve blood pressure control. We will watch for signs of hypotension as she continues her lifestyle modifications. Misty Benson will continue her medications.  3. At risk for diabetes mellitus Misty Benson was given approximately 15 minutes of diabetes education and counseling today. We discussed intensive lifestyle modifications today with an emphasis on weight loss as well as increasing exercise and decreasing simple carbohydrates in her diet. We also reviewed medication options with an emphasis on risk versus benefit of those discussed.   Repetitive spaced learning was employed today to elicit superior memory formation and behavioral change.   4. Obesity, current BMI 36  Misty Benson is currently in the action stage of change. As such, her goal is to continue with weight loss efforts. She has agreed to the Category 2 Plan.   Meal planning Intentional eating Reviewed labs (from 04/05/2021; CMP, Lipid Panel, Vit D, A1C, Insulin, and Thyroid panel) with patient. Increase water intake Protein equivalents hand out provided to patient  Exercise goals: No exercise has been prescribed at this time.  Behavioral modification strategies: increasing lean protein intake, decreasing simple carbohydrates, increasing vegetables, increasing water intake, decreasing eating out, no skipping meals, meal planning and cooking strategies, keeping healthy foods in the home, and planning for success.  Misty Benson has agreed to follow-up with our clinic in 2 weeks. She was informed of the importance of frequent follow-up visits to maximize her success with intensive lifestyle modifications for her multiple health conditions.   Objective:   Blood pressure 130/72, pulse 70, temperature 99.4 F (37.4 C), height 5'  8" (1.727 m), weight 243 lb (110.2 kg), last menstrual period 05/30/2012, SpO2 96 %. Body mass index is 36.95 kg/m.  General: Cooperative, alert, well developed, in no acute distress. HEENT: Conjunctivae and lids  unremarkable. Cardiovascular: Regular rhythm.  Lungs: Normal work of breathing. Neurologic: No focal deficits.   Lab Results  Component Value Date   CREATININE 0.66 04/05/2021   BUN 23 04/05/2021   NA 145 (H) 04/05/2021   K 4.8 04/05/2021   CL 105 04/05/2021   CO2 23 04/05/2021   Lab Results  Component Value Date   ALT 24 04/05/2021   AST 16 04/05/2021   ALKPHOS 83 04/05/2021   BILITOT 0.3 04/05/2021   Lab Results  Component Value Date   HGBA1C 5.8 (H) 04/05/2021   Lab Results  Component Value Date   INSULIN 9.5 04/05/2021   Lab Results  Component Value Date   TSH 2.220 04/05/2021   Lab Results  Component Value Date   CHOL 227 (H) 04/05/2021   HDL 73 04/05/2021   LDLCALC 132 (H) 04/05/2021   TRIG 125 04/05/2021   CHOLHDL 3.4 02/23/2020   Lab Results  Component Value Date   WBC 5.4 02/23/2020   HGB 13.3 02/23/2020   HCT 40.3 02/23/2020   MCV 93.1 02/23/2020   PLT 280 02/23/2020   No results found for: IRON, TIBC, FERRITIN   Attestation Statements:   Reviewed by clinician on day of visit: allergies, medications, problem list, medical history, surgical history, family history, social history, and previous encounter notes.  ILennette Bihari, CMA, am acting as Location manager for [employer], DO.  I have reviewed the above documentation for accuracy and completeness, and I agree with the above. Jearld Lesch, DO

## 2021-05-01 ENCOUNTER — Other Ambulatory Visit: Payer: Self-pay

## 2021-05-02 ENCOUNTER — Encounter: Payer: Self-pay | Admitting: Internal Medicine

## 2021-05-02 ENCOUNTER — Encounter (INDEPENDENT_AMBULATORY_CARE_PROVIDER_SITE_OTHER): Payer: Self-pay | Admitting: Bariatrics

## 2021-05-02 ENCOUNTER — Ambulatory Visit (INDEPENDENT_AMBULATORY_CARE_PROVIDER_SITE_OTHER): Payer: BC Managed Care – PPO | Admitting: Internal Medicine

## 2021-05-02 DIAGNOSIS — F900 Attention-deficit hyperactivity disorder, predominantly inattentive type: Secondary | ICD-10-CM

## 2021-05-02 MED ORDER — AMPHETAMINE-DEXTROAMPHETAMINE 20 MG PO TABS: 20.0000 mg | ORAL_TABLET | Freq: Two times a day (BID) | ORAL | 0 refills | Status: DC

## 2021-05-02 MED ORDER — AMPHETAMINE-DEXTROAMPHETAMINE 20 MG PO TABS
20.0000 mg | ORAL_TABLET | Freq: Two times a day (BID) | ORAL | 0 refills | Status: DC
Start: 2021-05-02 — End: 2021-05-16

## 2021-05-02 NOTE — Progress Notes (Signed)
Established Patient Office Visit     This visit occurred during the SARS-CoV-2 public health emergency.  Safety protocols were in place, including screening questions prior to the visit, additional usage of staff PPE, and extensive cleaning of exam room while observing appropriate contact time as indicated for disinfecting solutions.    CC/Reason for Visit: Medication refills  HPI: Misty Benson is a 60 y.o. female who is coming in today for the above mentioned reasons.  She is here today for Adderall refills per controlled substance protocol.  She has been doing well.  She is currently taking 20 mg of Adderall twice daily and feels like the dose is doing well for her.  She has no complaints today.  Past Medical/Surgical History: Past Medical History:  Diagnosis Date   ADHD (attention deficit hyperactivity disorder)    Anxiety    Back pain    Cancer ([facility])    BASAL CELL FACE AND SHOULDERS , SQUAMOS CELL ON HAND AND MELANOMA LEFT ARM...   Cervix cancer ([place]) 1987   microinvasive   Constipation    Edema of both lower extremities    Hypertension    Joint pain    Leiomyoma 2013   multiple small   Melanoma ([place])    Obesity    Shoulder pain    Snoring     Past Surgical History:  Procedure Laterality Date   arm surgery     to remove melanoma on left upper outter arm   BUNIONECTOMY     LEFT FOOT WITH PINS   CERVICAL CONE BIOPSY  1987   CESAREAN SECTION     DILATION AND CURETTAGE OF UTERUS     x2   FOOT SURGERY     MELANOMA EXCISION     TONSILLECTOMY     as a child    Social History:  reports that she has never smoked. She has never used smokeless tobacco. She reports current alcohol use of about 4.0 standard drinks of alcohol per week. She reports that she does not use drugs.  Allergies: No Known Allergies  Family History:  Family History  Problem Relation Age of Onset   Stroke Mother    Osteoporosis Mother    Hypertension Mother    Hyperlipidemia Father     Atrial fibrillation Father    Cancer Father        Prostate   Heart defect Father    Breast cancer Maternal Grandmother 91     Current Outpatient Medications:    amphetamine-dextroamphetamine (ADDERALL) 20 MG tablet, Take 1 tablet (20 mg total) by mouth 2 (two) times daily., Disp: 60 tablet, Rfl: 0   amphetamine-dextroamphetamine (ADDERALL) 20 MG tablet, Take 1 tablet (20 mg total) by mouth 2 (two) times daily., Disp: 60 tablet, Rfl: 0   amphetamine-dextroamphetamine (ADDERALL) 20 MG tablet, Take 1 tablet (20 mg total) by mouth 2 (two) times daily., Disp: 60 tablet, Rfl: 0   calcium carbonate (OS-CAL) 600 MG TABS tablet, Take 600 mg by mouth 2 (two) times daily with a meal., Disp: , Rfl:    Cholecalciferol (VITAMIN D3) 125 MCG (5000 UT) TABS, Take by mouth., Disp: , Rfl:    citalopram (CELEXA) 20 MG tablet, Take 1 tablet (20 mg total) by mouth daily., Disp: 90 tablet, Rfl: 1   diclofenac (VOLTAREN) 50 MG EC tablet, Take 50 mg by mouth 2 (two) times daily as needed., Disp: , Rfl:    hydrochlorothiazide (MICROZIDE) 12.5 MG capsule, TAKE 1 CAPSULE(12.5  MG) BY MOUTH DAILY, Disp: 90 capsule, Rfl: 1   ibuprofen (ADVIL,MOTRIN) 200 MG tablet, Take 3-4 tablets (600-800 mg total) by mouth every 6 (six) hours as needed for fever, headache, mild pain, moderate pain or cramping., Disp: 30 tablet, Rfl: 6   Omega-3 1000 MG CAPS, Take by mouth., Disp: , Rfl:    verapamil (CALAN-SR) 240 MG CR tablet, TAKE 1 TABLET(240 MG) BY MOUTH DAILY, Disp: 90 tablet, Rfl: 1   vitamin B-12 (CYANOCOBALAMIN) 500 MCG tablet, Take 600 mcg by mouth daily. + C, Disp: , Rfl:    zinc gluconate 50 MG tablet, Take 50 mg by mouth daily., Disp: , Rfl:   Current Facility-Administered Medications:    0.9 %  sodium chloride infusion, , Intravenous, PRN, Jacquelin Hawking, NP  Review of Systems:  Constitutional: Denies fever, chills, diaphoresis, appetite change and fatigue.  HEENT: Denies photophobia, eye pain, redness, hearing  loss, ear pain, congestion, sore throat, rhinorrhea, sneezing, mouth sores, trouble swallowing, neck pain, neck stiffness and tinnitus.   Respiratory: Denies SOB, DOE, cough, chest tightness,  and wheezing.   Cardiovascular: Denies chest pain, palpitations and leg swelling.  Gastrointestinal: Denies nausea, vomiting, abdominal pain, diarrhea, constipation, blood in stool and abdominal distention.  Genitourinary: Denies dysuria, urgency, frequency, hematuria, flank pain and difficulty urinating.  Endocrine: Denies: hot or cold intolerance, sweats, changes in hair or nails, polyuria, polydipsia. Musculoskeletal: Denies myalgias, back pain, joint swelling, arthralgias and gait problem.  Skin: Denies pallor, rash and wound.  Neurological: Denies dizziness, seizures, syncope, weakness, light-headedness, numbness and headaches.  Hematological: Denies adenopathy. Easy bruising, personal or family bleeding history  Psychiatric/Behavioral: Denies suicidal ideation, mood changes, confusion, nervousness, sleep disturbance and agitation    Physical Exam: Vitals:   05/02/21 1125  BP: 132/88  Pulse: (!) 58  Temp: 98.2 F (36.8 C)  TempSrc: Oral  SpO2: 98%  Weight: 246 lb 14.4 oz (112 kg)    Body mass index is 37.54 kg/m.   Constitutional: NAD, calm, comfortable Eyes: PERRL, lids and conjunctivae normal ENMT: Mucous membranes are moist.  Neurologic: Grossly intact and nonfocal Psychiatric: Normal judgment and insight. Alert and oriented x 3. Normal mood.    Impression and Plan:  Attention deficit hyperactivity disorder (ADHD), predominantly inattentive type  -PDMP reviewed, no red flags, overdose risk score is 110. -Refill Adderall 20 mg to take 1 tablet twice daily for total of 60 tablets a month x3 months.    Lelon Frohlich, MD [facility]

## 2021-05-03 ENCOUNTER — Encounter: Payer: BC Managed Care – PPO | Admitting: Internal Medicine

## 2021-05-08 ENCOUNTER — Ambulatory Visit (INDEPENDENT_AMBULATORY_CARE_PROVIDER_SITE_OTHER): Payer: BC Managed Care – PPO | Admitting: Bariatrics

## 2021-05-09 ENCOUNTER — Other Ambulatory Visit: Payer: Self-pay

## 2021-05-09 ENCOUNTER — Encounter (INDEPENDENT_AMBULATORY_CARE_PROVIDER_SITE_OTHER): Payer: Self-pay | Admitting: Bariatrics

## 2021-05-09 ENCOUNTER — Ambulatory Visit (INDEPENDENT_AMBULATORY_CARE_PROVIDER_SITE_OTHER): Payer: BC Managed Care – PPO | Admitting: Bariatrics

## 2021-05-09 VITALS — BP 125/72 | HR 72 | Temp 97.8°F | Ht 68.0 in | Wt 238.0 lb

## 2021-05-09 DIAGNOSIS — R7303 Prediabetes: Secondary | ICD-10-CM | POA: Diagnosis not present

## 2021-05-09 DIAGNOSIS — E7849 Other hyperlipidemia: Secondary | ICD-10-CM | POA: Diagnosis not present

## 2021-05-09 DIAGNOSIS — Z6837 Body mass index (BMI) 37.0-37.9, adult: Secondary | ICD-10-CM | POA: Diagnosis not present

## 2021-05-12 DIAGNOSIS — M79671 Pain in right foot: Secondary | ICD-10-CM | POA: Diagnosis not present

## 2021-05-12 DIAGNOSIS — M25571 Pain in right ankle and joints of right foot: Secondary | ICD-10-CM | POA: Diagnosis not present

## 2021-05-15 NOTE — Progress Notes (Signed)
Chief Complaint:   OBESITY Misty Benson is here to discuss her progress with her obesity treatment plan along with follow-up of her obesity related diagnoses. Misty Benson is on the Category 2 Plan and states she is following her eating plan approximately 80% of the time. Misty Benson states she is more active and busy.  Today's visit was #: 3 Starting weight: 249 lbs Starting date: 04/05/2021 Today's weight: 238 lbs Today's date: 05/09/2021 Total lbs lost to date: 11 lbs Total lbs lost since last in-office visit: 5 lbs  Interim History: Misty Benson is down an additional 5 pounds since her last visit.  Subjective:   1. Prediabetes Misty Benson has a diagnosis of prediabetes based on her elevated HgA1c and was informed this puts her at greater risk of developing diabetes. She continues to work on diet and exercise to decrease her risk of diabetes. She denies nausea or hypoglycemia.  She is on no medications for this.  Lab Results  Component Value Date   HGBA1C 5.8 (H) 04/05/2021   Lab Results  Component Value Date   INSULIN 9.5 04/05/2021   2. Other hyperlipidemia Misty Benson has hyperlipidemia and has been trying to improve her cholesterol levels with intensive lifestyle modification including a low saturated fat diet, exercise and weight loss. She denies any chest pain, claudication or myalgias.  Taking Omega 3.  Lab Results  Component Value Date   ALT 24 04/05/2021   AST 16 04/05/2021   ALKPHOS 83 04/05/2021   BILITOT 0.3 04/05/2021   Lab Results  Component Value Date   CHOL 227 (H) 04/05/2021   HDL 73 04/05/2021   LDLCALC 132 (H) 04/05/2021   TRIG 125 04/05/2021   CHOLHDL 3.4 02/23/2020   Assessment/Plan:   1. Prediabetes Misty Benson will continue to work on weight loss, exercise, and decreasing simple carbohydrates to help decrease the risk of diabetes.  Increase healthy fats and protein.  Decrease carbohydrates.  2. Other hyperlipidemia Cardiovascular risk and specific lipid/LDL goals reviewed.  We  discussed several lifestyle modifications today and Misty Benson will continue to work on diet, exercise and weight loss efforts. Orders and follow up as documented in patient record.  Continue Omega 3.  No trans fats.  Increase MUFAs and PUFAs.  Counseling Intensive lifestyle modifications are the first line treatment for this issue. Dietary changes: Increase soluble fiber. Decrease simple carbohydrates. Exercise changes: Moderate to vigorous-intensity aerobic activity 150 minutes per week if tolerated. Lipid-lowering medications: see documented in medical record.  3. Obesity, current BMI 36.2  Misty Benson is currently in the action stage of change. As such, her goal is to continue with weight loss efforts. She has agreed to the Category 2 Plan.   She will work on meal planning, intentional eating, and increasing water intake and continue to increase.  Exercise goals:  Very active.  Behavioral modification strategies: increasing lean protein intake, decreasing simple carbohydrates, increasing vegetables, increasing water intake, decreasing eating out, no skipping meals, meal planning and cooking strategies, keeping healthy foods in the home, avoiding temptations, and planning for success.  Misty Benson has agreed to follow-up with our clinic in 2 weeks. She was informed of the importance of frequent follow-up visits to maximize her success with intensive lifestyle modifications for her multiple health conditions.   Objective:   Blood pressure 125/72, pulse 72, temperature 97.8 F (36.6 C), height 5\' 8"  (1.727 m), last menstrual period 05/30/2012, SpO2 96 %. Body mass index is 37.54 kg/m.  General: Cooperative, alert, well developed, in no acute distress.  HEENT: Conjunctivae and lids unremarkable. Cardiovascular: Regular rhythm.  Lungs: Normal work of breathing. Neurologic: No focal deficits.   Lab Results  Component Value Date   CREATININE 0.66 04/05/2021   BUN 23 04/05/2021   NA 145 (H) 04/05/2021    K 4.8 04/05/2021   CL 105 04/05/2021   CO2 23 04/05/2021   Lab Results  Component Value Date   ALT 24 04/05/2021   AST 16 04/05/2021   ALKPHOS 83 04/05/2021   BILITOT 0.3 04/05/2021   Lab Results  Component Value Date   HGBA1C 5.8 (H) 04/05/2021   Lab Results  Component Value Date   INSULIN 9.5 04/05/2021   Lab Results  Component Value Date   TSH 2.220 04/05/2021   Lab Results  Component Value Date   CHOL 227 (H) 04/05/2021   HDL 73 04/05/2021   LDLCALC 132 (H) 04/05/2021   TRIG 125 04/05/2021   CHOLHDL 3.4 02/23/2020   Lab Results  Component Value Date   VD25OH 41.1 04/05/2021   VD25OH 41 01/21/2019   VD25OH 39 07/18/2017   Lab Results  Component Value Date   WBC 5.4 02/23/2020   HGB 13.3 02/23/2020   HCT 40.3 02/23/2020   MCV 93.1 02/23/2020   PLT 280 02/23/2020   Attestation Statements:   Reviewed by clinician on day of visit: allergies, medications, problem list, medical history, surgical history, family history, social history, and previous encounter notes.  Time spent on visit including pre-visit chart review and post-visit care and charting was 20 minutes.   I, Water quality scientist, CMA, am acting as Location manager for [employer], DO  I have reviewed the above documentation for accuracy and completeness, and I agree with the above. Jearld Lesch, DO

## 2021-05-16 ENCOUNTER — Encounter (INDEPENDENT_AMBULATORY_CARE_PROVIDER_SITE_OTHER): Payer: Self-pay | Admitting: Bariatrics

## 2021-05-28 ENCOUNTER — Other Ambulatory Visit: Payer: Self-pay

## 2021-05-28 ENCOUNTER — Ambulatory Visit (INDEPENDENT_AMBULATORY_CARE_PROVIDER_SITE_OTHER): Payer: BC Managed Care – PPO | Admitting: Bariatrics

## 2021-05-28 ENCOUNTER — Encounter (INDEPENDENT_AMBULATORY_CARE_PROVIDER_SITE_OTHER): Payer: Self-pay | Admitting: Bariatrics

## 2021-05-28 VITALS — BP 116/68 | HR 64 | Temp 98.2°F | Ht 68.0 in | Wt 238.0 lb

## 2021-05-28 DIAGNOSIS — Z6837 Body mass index (BMI) 37.0-37.9, adult: Secondary | ICD-10-CM | POA: Diagnosis not present

## 2021-05-28 DIAGNOSIS — R7303 Prediabetes: Secondary | ICD-10-CM

## 2021-05-28 DIAGNOSIS — Z9189 Other specified personal risk factors, not elsewhere classified: Secondary | ICD-10-CM | POA: Diagnosis not present

## 2021-05-28 DIAGNOSIS — I10 Essential (primary) hypertension: Secondary | ICD-10-CM | POA: Diagnosis not present

## 2021-05-28 DIAGNOSIS — E66812 Obesity, class 2: Secondary | ICD-10-CM

## 2021-05-28 MED ORDER — METFORMIN HCL 500 MG PO TABS: 500.0000 mg | ORAL_TABLET | Freq: Every day | ORAL | 0 refills | Status: DC

## 2021-06-04 ENCOUNTER — Encounter (INDEPENDENT_AMBULATORY_CARE_PROVIDER_SITE_OTHER): Payer: Self-pay | Admitting: Bariatrics

## 2021-06-04 NOTE — Progress Notes (Signed)
Chief Complaint:   OBESITY Misty Benson is here to discuss her progress with her obesity treatment plan along with follow-up of her obesity related diagnoses. Misty Benson is on the Category 2 Plan and states she is following her eating plan approximately 80% of the time. Misty Benson states she is walking 60 minutes 2 times per week.  Today's visit was #: 4 Starting weight: 249 lbs Starting date: 04/05/2021 Today's weight: 238 lbs Today's date: 05/28/2021 Total lbs lost to date: 11 Total lbs lost since last in-office visit: 0  Interim History: Misty Benson's weight remains the same as her last visit. She has been more stressed (fluctuating between not enough and too much).  Subjective:   1. Essential hypertension Misty Benson's BP is controlled. She is taking HCTZ.  2. Pre-diabetes She is not on medication.  3. At risk for side effect of medication Misty Benson is at risk for side effects for medication due to starting Metformin.  Assessment/Plan:   1. Essential hypertension Misty Benson is working on healthy weight loss and exercise to improve blood pressure control. We will watch for signs of hypotension as she continues her lifestyle modifications. Continue current treatment plan.  2. Pre-diabetes Misty Benson will increase exercise, continue to work on weight loss, exercise, and decreasing simple carbohydrates to help decrease the risk of diabetes. Start Metformin 500 mg, as prescribed below.  - metFORMIN (GLUCOPHAGE) 500 MG tablet; Take 1 tablet (500 mg total) by mouth daily with lunch.  Dispense: 30 tablet; Refill: 0  3. At risk for side effect of medication Misty Benson was given approximately 15 minutes of drug side effect counseling today.  We discussed side effect possibility and risk versus benefits. Misty Benson agreed to the medication and will contact this office if these side effects are intolerable.  Repetitive spaced learning was employed today to elicit superior memory formation and behavioral change.   4. Obesity,  current BMI 36.2  Misty Benson is currently in the action stage of change. As such, her goal is to continue with weight loss efforts. She has agreed to the Category 2 Plan.   Misty Benson will adhere closely to the plan. Meal preparation  Exercise goals:  As is. Pt has bilateral tendonitis.  Behavioral modification strategies: increasing lean protein intake, decreasing simple carbohydrates, increasing vegetables, increasing water intake, decreasing eating out, no skipping meals, meal planning and cooking strategies, keeping healthy foods in the home, and planning for success.  Misty Benson has agreed to follow-up with our clinic in 2 weeks. She was informed of the importance of frequent follow-up visits to maximize her success with intensive lifestyle modifications for her multiple health conditions.   Objective:   Blood pressure 116/68, pulse 64, temperature 98.2 F (36.8 C), height '5\' 8"'$  (1.727 m), weight 238 lb (108 kg), last menstrual period 05/30/2012, SpO2 96 %. Body mass index is 36.19 kg/m.  General: Cooperative, alert, well developed, in no acute distress. HEENT: Conjunctivae and lids unremarkable. Cardiovascular: Regular rhythm.  Lungs: Normal work of breathing. Neurologic: No focal deficits.   Lab Results  Component Value Date   CREATININE 0.66 04/05/2021   BUN 23 04/05/2021   NA 145 (H) 04/05/2021   K 4.8 04/05/2021   CL 105 04/05/2021   CO2 23 04/05/2021   Lab Results  Component Value Date   ALT 24 04/05/2021   AST 16 04/05/2021   ALKPHOS 83 04/05/2021   BILITOT 0.3 04/05/2021   Lab Results  Component Value Date   HGBA1C 5.8 (H) 04/05/2021   Lab Results  Component Value Date   INSULIN 9.5 04/05/2021   Lab Results  Component Value Date   TSH 2.220 04/05/2021   Lab Results  Component Value Date   CHOL 227 (H) 04/05/2021   HDL 73 04/05/2021   LDLCALC 132 (H) 04/05/2021   TRIG 125 04/05/2021   CHOLHDL 3.4 02/23/2020   Lab Results  Component Value Date   VD25OH 41.1  04/05/2021   VD25OH 41 01/21/2019   VD25OH 39 07/18/2017   Lab Results  Component Value Date   WBC 5.4 02/23/2020   HGB 13.3 02/23/2020   HCT 40.3 02/23/2020   MCV 93.1 02/23/2020   PLT 280 02/23/2020    Attestation Statements:   Reviewed by clinician on day of visit: allergies, medications, problem list, medical history, surgical history, family history, social history, and previous encounter notes.  Coral Ceo, CMA, am acting as Location manager for [employer], DO.  I have reviewed the above documentation for accuracy and completeness, and I agree with the above. Jearld Lesch, DO

## 2021-06-06 ENCOUNTER — Other Ambulatory Visit (HOSPITAL_COMMUNITY)
Admission: RE | Admit: 2021-06-06 | Discharge: 2021-06-06 | Disposition: A | Discharge status: Home / Self Care | Payer: BC Managed Care – PPO | Source: Ambulatory Visit | Attending: Obstetrics & Gynecology | Admitting: Obstetrics & Gynecology

## 2021-06-06 ENCOUNTER — Ambulatory Visit (INDEPENDENT_AMBULATORY_CARE_PROVIDER_SITE_OTHER): Payer: BC Managed Care – PPO | Admitting: Obstetrics & Gynecology

## 2021-06-06 ENCOUNTER — Encounter: Payer: Self-pay | Admitting: Obstetrics & Gynecology

## 2021-06-06 ENCOUNTER — Other Ambulatory Visit: Payer: Self-pay

## 2021-06-06 VITALS — BP 114/76 | HR 70 | Resp 16 | Ht 67.75 in | Wt 236.0 lb

## 2021-06-06 DIAGNOSIS — D061 Carcinoma in situ of exocervix: Secondary | ICD-10-CM

## 2021-06-06 DIAGNOSIS — Z6836 Body mass index (BMI) 36.0-36.9, adult: Secondary | ICD-10-CM

## 2021-06-06 DIAGNOSIS — Z01419 Encounter for gynecological examination (general) (routine) without abnormal findings: Secondary | ICD-10-CM

## 2021-06-06 DIAGNOSIS — Z780 Asymptomatic menopausal state: Secondary | ICD-10-CM | POA: Diagnosis not present

## 2021-06-06 NOTE — Progress Notes (Signed)
Misty Benson [birth year] [id number]   History:    60 y.o. [id number] Married.  Husband is an Forensic psychologist.  Stay at home mom.  2 daughters: 10 yo in [place], [place] and 60 yo in [place].  Son is 58 yo at [facility] [employer].  RP:  Established patient presenting for annual gyn exam   HPI: Postmenopausal.  No menopausal symptoms.  No vaginal bleeding. Mild cystocele and rectocele which are asymptomatic.  Very mild SUI with coughing. Pap smear 01/2019.  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  Pap smears have been normal since then.  Breasts normal.  Colono benign Polyps 11/2020.  BMI 36.15.  [facility].  Health labs there and with Fam MD.     Past medical history,surgical history, family history and social history were all reviewed and documented in the EPIC chart.  Gynecologic History Patient's last menstrual period was 05/30/2012.  Obstetric History OB History  Gravida Para Term Preterm AB Living  '7 3     4 3  '$ SAB IAB Ectopic Multiple Live Births  4       3    # Outcome Date GA Lbr Len/2nd Weight Sex Delivery Anes PTL Lv  7 SAB           6 SAB           5 SAB           4 SAB           3 Para     M CS-Unspec   LIV  2 Para     F Vag-Spont   LIV  1 Para     F Vag-Spont   LIV     ROS: A ROS was performed and pertinent positives and negatives are included in the history.  GENERAL: No fevers or chills. HEENT: No change in vision, no earache, sore throat or sinus congestion. NECK: No pain or stiffness. CARDIOVASCULAR: No chest pain or pressure. No palpitations. PULMONARY: No shortness of breath, cough or wheeze. GASTROINTESTINAL: No abdominal pain, nausea, vomiting or diarrhea, melena or bright red blood per rectum. GENITOURINARY: No urinary frequency, urgency, hesitancy or dysuria. MUSCULOSKELETAL: No joint or muscle pain, no back pain, no recent trauma. DERMATOLOGIC: No rash, no itching, no lesions. ENDOCRINE: No polyuria,  polydipsia, no heat or cold intolerance. No recent change in weight. HEMATOLOGICAL: No anemia or easy bruising or bleeding. NEUROLOGIC: No headache, seizures, numbness, tingling or weakness. PSYCHIATRIC: No depression, no loss of interest in normal activity or change in sleep pattern.     Exam:   Ht 5' 7.75" (1.721 m)   Wt 236 lb (107 kg)   LMP 05/30/2012   BMI 36.15 kg/m   Body mass index is 36.15 kg/m.  General appearance : Well developed well nourished female. No acute distress HEENT: Eyes: no retinal hemorrhage or exudates,  Neck supple, trachea midline, no carotid bruits, no thyroidmegaly Lungs: Clear to auscultation, no rhonchi or wheezes, or rib retractions  Heart: Regular rate and rhythm, no murmurs or gallops Breast:Examined in sitting and supine position were symmetrical in appearance, no palpable masses or tenderness,  no skin retraction, no nipple inversion, no nipple discharge, no skin discoloration, no axillary or supraclavicular lymphadenopathy Abdomen: no palpable masses or tenderness, no rebound or guarding Extremities: no edema or skin discoloration or tenderness  Pelvic: Vulva: Normal             Vagina:  No gross lesions or discharge  Cervix: No gross lesions or discharge.  Pap reflex done.  Uterus  AV, normal size, shape and consistency, non-tender and mobile  Adnexa  Without masses or tenderness  Anus: Normal   Assessment/Plan:  60 y.o. female for annual exam   1. Encounter for routine gynecological examination with Papanicolaou smear of cervix Normal gynecologic exam.  Pap reflex done.  Breast exam normal.  Screening mammogram May 2022 was negative.  Colonoscopy in 2011, will repeat now.  Health labs with Fam MD. - Cytology - PAP( [place])  2. Carcinoma in situ of exocervix Pap reflex done today.  3. Postmenopause Well on no HRT.  No PMB.  Vit D, Ca++ 1.5 g/d total, regular wt bearing physical activities.  4. Class 2 severe obesity due to excess  calories with serious comorbidity and body mass index (BMI) of 36.0 to 36.9 in adult [facility])  [facility].  Princess Bruins MD, 10:49 AM 06/06/2021

## 2021-06-07 LAB — CYTOLOGY - PAP: Diagnosis: NEGATIVE

## 2021-06-08 ENCOUNTER — Other Ambulatory Visit: Payer: Self-pay | Admitting: Internal Medicine

## 2021-06-08 DIAGNOSIS — F900 Attention-deficit hyperactivity disorder, predominantly inattentive type: Secondary | ICD-10-CM

## 2021-06-10 ENCOUNTER — Encounter: Payer: Self-pay | Admitting: Obstetrics & Gynecology

## 2021-06-11 ENCOUNTER — Ambulatory Visit (INDEPENDENT_AMBULATORY_CARE_PROVIDER_SITE_OTHER): Payer: BC Managed Care – PPO | Admitting: Bariatrics

## 2021-06-11 ENCOUNTER — Encounter (INDEPENDENT_AMBULATORY_CARE_PROVIDER_SITE_OTHER): Payer: Self-pay | Admitting: Bariatrics

## 2021-06-11 VITALS — BP 124/78 | HR 64 | Temp 98.3°F | Ht 68.0 in | Wt 231.0 lb

## 2021-06-11 DIAGNOSIS — R7303 Prediabetes: Secondary | ICD-10-CM

## 2021-06-11 DIAGNOSIS — Z6837 Body mass index (BMI) 37.0-37.9, adult: Secondary | ICD-10-CM | POA: Diagnosis not present

## 2021-06-11 DIAGNOSIS — E7849 Other hyperlipidemia: Secondary | ICD-10-CM

## 2021-06-12 NOTE — Progress Notes (Signed)
Chief Complaint:   OBESITY Misty Benson is here to discuss her progress with her obesity treatment plan along with follow-up of her obesity related diagnoses. [name] is on the Category 2 Plan and states she is following her eating plan approximately 85-90% of the time. [name] states she is doing 0 minutes 0 times per week.  Today's visit was #: 5 Starting weight: 249 lbs Starting date: 04/05/2021 Today's weight: 231 lbs Today's date: 06/11/2021 Total lbs lost to date: 18 lbs Total lbs lost since last in-office visit: 7 lbs  Interim History: [name] is down 7 lbs since her last visit. She has been tracking her food less this interval.   Subjective:   1. Other hyperlipidemia [name] is taking Omega 3.  2. Prediabetes [name] is taking Metformin.  Assessment/Plan:   1. Other hyperlipidemia [name] will continue her medication. Cardiovascular risk and specific lipid/LDL goals reviewed.  We discussed several lifestyle modifications today and [name] will continue to work on diet, exercise and weight loss efforts. Orders and follow up as documented in patient record.   Counseling Intensive lifestyle modifications are the first line treatment for this issue. Dietary changes: Increase soluble fiber. Decrease simple carbohydrates. Exercise changes: Moderate to vigorous-intensity aerobic activity 150 minutes per week if tolerated. Lipid-lowering medications: see documented in medical record.   2. Prediabetes [name] will continue to work on weight loss, exercise, and decreasing simple carbohydrates to help decrease the risk of diabetes. [name] will continue her medications.   3. Obesity, current BMI 35.2 [name] is currently in the action stage of change. As such, her goal is to continue with weight loss efforts. She has agreed to the Category 2 Plan.   [name] will continue meal planning. She will continue intentional eating.  Exercise goals:  [name] will be active in the yard and house.  Behavioral  modification strategies: increasing lean protein intake, decreasing simple carbohydrates, increasing vegetables, increasing water intake, decreasing eating out, no skipping meals, meal planning and cooking strategies, keeping healthy foods in the home, and planning for success.  [name] has agreed to follow-up with our clinic in 2 weeks. She was informed of the importance of frequent follow-up visits to maximize her success with intensive lifestyle modifications for her multiple health conditions.   Objective:   Blood pressure 124/78, pulse 64, temperature 98.3 F (36.8 C), height '5\' 8"'$  (1.727 m), weight 231 lb (104.8 kg), last menstrual period 05/30/2012, SpO2 97 %. Body mass index is 35.12 kg/m.  General: Cooperative, alert, well developed, in no acute distress. HEENT: Conjunctivae and lids unremarkable. Cardiovascular: Regular rhythm.  Lungs: Normal work of breathing. Neurologic: No focal deficits.   Lab Results  Component Value Date   CREATININE 0.66 04/05/2021   BUN 23 04/05/2021   NA 145 (H) 04/05/2021   K 4.8 04/05/2021   CL 105 04/05/2021   CO2 23 04/05/2021   Lab Results  Component Value Date   ALT 24 04/05/2021   AST 16 04/05/2021   ALKPHOS 83 04/05/2021   BILITOT 0.3 04/05/2021   Lab Results  Component Value Date   HGBA1C 5.8 (H) 04/05/2021   Lab Results  Component Value Date   INSULIN 9.5 04/05/2021   Lab Results  Component Value Date   TSH 2.220 04/05/2021   Lab Results  Component Value Date   CHOL 227 (H) 04/05/2021   HDL 73 04/05/2021   LDLCALC 132 (H) 04/05/2021   TRIG 125 04/05/2021   CHOLHDL 3.4 02/23/2020   Lab Results  Component Value Date   VD25OH 41.1 04/05/2021   VD25OH 41 01/21/2019   VD25OH 39 07/18/2017   Lab Results  Component Value Date   WBC 5.4 02/23/2020   HGB 13.3 02/23/2020   HCT 40.3 02/23/2020   MCV 93.1 02/23/2020   PLT 280 02/23/2020   No results found for: IRON, TIBC, FERRITIN  Attestation Statements:    Reviewed by clinician on day of visit: allergies, medications, problem list, medical history, surgical history, family history, social history, and previous encounter notes.  Time spent on visit including pre-visit chart review and post-visit care and charting was 20 minutes.   I, Lizbeth Bark, RMA, am acting as Location manager for [employer], DO.   I have reviewed the above documentation for accuracy and completeness, and I agree with the above. Jearld Lesch, DO

## 2021-06-13 ENCOUNTER — Encounter (INDEPENDENT_AMBULATORY_CARE_PROVIDER_SITE_OTHER): Payer: Self-pay | Admitting: Bariatrics

## 2021-06-13 ENCOUNTER — Telehealth: Payer: Self-pay | Admitting: Internal Medicine

## 2021-06-13 NOTE — Telephone Encounter (Signed)
There should be a refill on file at the pharmacy.  Refills given 05/02/21.

## 2021-06-13 NOTE — Telephone Encounter (Signed)
PT needs a refill of their amphetamine-dextroamphetamine (ADDERALL) 20 MG tablet called in as they have just enough to maybe get through the weekend.

## 2021-06-18 ENCOUNTER — Other Ambulatory Visit: Payer: Self-pay

## 2021-06-18 DIAGNOSIS — F900 Attention-deficit hyperactivity disorder, predominantly inattentive type: Secondary | ICD-10-CM

## 2021-06-18 NOTE — Telephone Encounter (Signed)
Patient called requesting a Rx refill  amphetamine-dextroamphetamine (ADDERALL) 20 MG tablet

## 2021-06-19 MED ORDER — AMPHETAMINE-DEXTROAMPHETAMINE 20 MG PO TABS: 20.0000 mg | ORAL_TABLET | Freq: Two times a day (BID) | ORAL | 0 refills | Status: DC

## 2021-06-19 NOTE — Telephone Encounter (Signed)
FYI  Spoke with pharmacist and there is a refill available, but they are out of stock.  I informed the patient and she will see which pharmacy her refill can be sent.

## 2021-06-19 NOTE — Telephone Encounter (Signed)
Pt returned call [employer]

## 2021-06-19 NOTE — Addendum Note (Signed)
Addended by: Westley Hummer B on: 06/19/2021 01:17 PM   Modules accepted: Orders

## 2021-06-24 ENCOUNTER — Other Ambulatory Visit (INDEPENDENT_AMBULATORY_CARE_PROVIDER_SITE_OTHER): Payer: Self-pay | Admitting: Bariatrics

## 2021-06-24 DIAGNOSIS — R7303 Prediabetes: Secondary | ICD-10-CM

## 2021-06-26 DIAGNOSIS — L821 Other seborrheic keratosis: Secondary | ICD-10-CM | POA: Diagnosis not present

## 2021-06-26 DIAGNOSIS — Z85828 Personal history of other malignant neoplasm of skin: Secondary | ICD-10-CM | POA: Diagnosis not present

## 2021-06-26 DIAGNOSIS — Z85820 Personal history of malignant melanoma of skin: Secondary | ICD-10-CM | POA: Diagnosis not present

## 2021-06-26 DIAGNOSIS — L814 Other melanin hyperpigmentation: Secondary | ICD-10-CM | POA: Diagnosis not present

## 2021-06-27 ENCOUNTER — Other Ambulatory Visit: Payer: Self-pay

## 2021-06-27 ENCOUNTER — Ambulatory Visit (INDEPENDENT_AMBULATORY_CARE_PROVIDER_SITE_OTHER): Payer: BC Managed Care – PPO | Admitting: Bariatrics

## 2021-06-27 ENCOUNTER — Encounter (INDEPENDENT_AMBULATORY_CARE_PROVIDER_SITE_OTHER): Payer: Self-pay | Admitting: Bariatrics

## 2021-06-27 VITALS — BP 124/80 | HR 69 | Temp 97.7°F | Ht 68.0 in | Wt 226.0 lb

## 2021-06-27 DIAGNOSIS — I10 Essential (primary) hypertension: Secondary | ICD-10-CM

## 2021-06-27 DIAGNOSIS — R7303 Prediabetes: Secondary | ICD-10-CM | POA: Diagnosis not present

## 2021-06-27 DIAGNOSIS — Z9189 Other specified personal risk factors, not elsewhere classified: Secondary | ICD-10-CM

## 2021-06-27 DIAGNOSIS — Z6837 Body mass index (BMI) 37.0-37.9, adult: Secondary | ICD-10-CM | POA: Diagnosis not present

## 2021-06-27 MED ORDER — METFORMIN HCL 500 MG PO TABS: 500.0000 mg | ORAL_TABLET | Freq: Every day | ORAL | 0 refills | Status: DC

## 2021-06-28 NOTE — Progress Notes (Signed)
Chief Complaint:   OBESITY Misty Benson is here to discuss her progress with her obesity treatment plan along with follow-up of her obesity related diagnoses. Misty Benson is on the Category 2 Plan and states she is following her eating plan approximately 85-90% of the time. Misty Benson states she is doing 0 minutes 0 times per week.  Today's visit was #: 5 Starting weight: 249 lbs Starting date: 04/05/2021 Today's weight: 226 lbs Today's date: 06/27/2021 Total lbs lost to date: 23 lbs Total lbs lost since last in-office visit: 5  Interim History: Misty Benson is down 5 lbs since her last visit. She states that it has been better.  Subjective:   1. Prediabetes Misty Benson's appetite is fairly normal.  2. Essential hypertension Review: taking medications as instructed, no medication side effects noted, no chest pain on exertion, no dyspnea on exertion, no swelling of ankles. Her hypertension  Is well controlled.  3. At risk for activity intolerance Misty Benson is at risk for activity intolerance due to obesity and weather.  Assessment/Plan:   1. Prediabetes Misty Benson will continue to work on weight loss, exercise, and decreasing simple carbohydrates to help decrease the risk of diabetes. We will refill Metformin 500 mg daily with lunch for 1 month with no refills.  - metFORMIN (GLUCOPHAGE) 500 MG tablet; Take 1 tablet (500 mg total) by mouth daily with lunch.  Dispense: 30 tablet; Refill: 0  2. Essential hypertension Misty Benson will continue her medication. She is working on healthy weight loss and exercise to improve blood pressure control. We will watch for signs of hypotension as she continues her lifestyle modifications.   3. At risk for activity intolerance Misty Benson was given approximately 15 minutes of exercise intolerance counseling today. She is 60 y.o. female and has risk factors exercise intolerance including obesity. We discussed intensive lifestyle modifications today with an emphasis on specific weight loss  instructions and strategies. Misty Benson will slowly increase activity as tolerated.  Repetitive spaced learning was employed today to elicit superior memory formation and behavioral change.   4. Obesity, current BMI 34.5 Misty Benson is currently in the action stage of change. As such, her goal is to continue with weight loss efforts. She has agreed to the Category 2 Plan and keeping a food journal and adhering to recommended goals of 1200 calories and 85 grams of protein.   Exercise goals:  Misty Benson will start to walk and do yard work.  Behavioral modification strategies: increasing lean protein intake, decreasing simple carbohydrates, increasing vegetables, increasing water intake, decreasing eating out, no skipping meals, meal planning and cooking strategies, keeping healthy foods in the home, and planning for success.  Misty Benson has agreed to follow-up with our clinic in 2 weeks. She was informed of the importance of frequent follow-up visits to maximize her success with intensive lifestyle modifications for her multiple health conditions.   Objective:   Blood pressure 124/80, pulse 69, temperature 97.7 F (36.5 C), height '5\' 8"'$  (1.727 m), weight 226 lb (102.5 kg), last menstrual period 05/30/2012, SpO2 97 %. Body mass index is 34.36 kg/m.  General: Cooperative, alert, well developed, in no acute distress. HEENT: Conjunctivae and lids unremarkable. Cardiovascular: Regular rhythm.  Lungs: Normal work of breathing. Neurologic: No focal deficits.   Lab Results  Component Value Date   CREATININE 0.66 04/05/2021   BUN 23 04/05/2021   NA 145 (H) 04/05/2021   K 4.8 04/05/2021   CL 105 04/05/2021   CO2 23 04/05/2021   Lab Results  Component Value Date  ALT 24 04/05/2021   AST 16 04/05/2021   ALKPHOS 83 04/05/2021   BILITOT 0.3 04/05/2021   Lab Results  Component Value Date   HGBA1C 5.8 (H) 04/05/2021   Lab Results  Component Value Date   INSULIN 9.5 04/05/2021   Lab Results  Component  Value Date   TSH 2.220 04/05/2021   Lab Results  Component Value Date   CHOL 227 (H) 04/05/2021   HDL 73 04/05/2021   LDLCALC 132 (H) 04/05/2021   TRIG 125 04/05/2021   CHOLHDL 3.4 02/23/2020   Lab Results  Component Value Date   VD25OH 41.1 04/05/2021   VD25OH 41 01/21/2019   VD25OH 39 07/18/2017   Lab Results  Component Value Date   WBC 5.4 02/23/2020   HGB 13.3 02/23/2020   HCT 40.3 02/23/2020   MCV 93.1 02/23/2020   PLT 280 02/23/2020   No results found for: IRON, TIBC, FERRITIN  Attestation Statements:   Reviewed by clinician on day of visit: allergies, medications, problem list, medical history, surgical history, family history, social history, and previous encounter notes.   I, Lizbeth Bark, RMA, am acting as Location manager for [employer], DO.   I have reviewed the above documentation for accuracy and completeness, and I agree with the above. Jearld Lesch, DO

## 2021-06-30 DIAGNOSIS — Z23 Encounter for immunization: Secondary | ICD-10-CM | POA: Diagnosis not present

## 2021-07-02 ENCOUNTER — Encounter (INDEPENDENT_AMBULATORY_CARE_PROVIDER_SITE_OTHER): Payer: Self-pay | Admitting: Bariatrics

## 2021-07-23 ENCOUNTER — Encounter (INDEPENDENT_AMBULATORY_CARE_PROVIDER_SITE_OTHER): Payer: Self-pay | Admitting: Bariatrics

## 2021-07-23 ENCOUNTER — Ambulatory Visit (INDEPENDENT_AMBULATORY_CARE_PROVIDER_SITE_OTHER): Payer: BC Managed Care – PPO | Admitting: Bariatrics

## 2021-07-23 ENCOUNTER — Other Ambulatory Visit: Payer: Self-pay

## 2021-07-23 VITALS — BP 119/86 | HR 76 | Temp 98.0°F | Ht 68.0 in | Wt 220.0 lb

## 2021-07-23 DIAGNOSIS — R7303 Prediabetes: Secondary | ICD-10-CM | POA: Diagnosis not present

## 2021-07-23 DIAGNOSIS — E7849 Other hyperlipidemia: Secondary | ICD-10-CM

## 2021-07-23 DIAGNOSIS — Z6837 Body mass index (BMI) 37.0-37.9, adult: Secondary | ICD-10-CM

## 2021-07-24 ENCOUNTER — Encounter (INDEPENDENT_AMBULATORY_CARE_PROVIDER_SITE_OTHER): Payer: Self-pay | Admitting: Bariatrics

## 2021-07-24 NOTE — Progress Notes (Signed)
Chief Complaint:   OBESITY Misty Benson is here to discuss her progress with her obesity treatment plan along with follow-up of her obesity related diagnoses. Misty Benson is on the Category 2 Plan and states she is following her eating plan approximately 80-85% of the time. Misty Benson states she is walking for 60 minutes 2 times per week.  Today's visit was #: 6 Starting weight: 249 lbs Starting date: 04/05/2021 Today's weight: 220 lbs Today's date: 07/23/2021 Total lbs lost to date: 29 lbs Total lbs lost since last in-office visit: 6 lbs  Interim History: Misty Benson is down another 6 lbs since her last visit.  Subjective:   1. Prediabetes Misty Benson is currently taking Metformin.  2. Other hyperlipidemia Misty Benson is currently taking Omega 3.  Assessment/Plan:   1. Prediabetes Misty Benson will continue to work on weight loss, exercise, and decreasing simple carbohydrates to help decrease the risk of diabetes. She will increase healthy fats and protein. She will continue Metformin.  2. Other hyperlipidemia Cardiovascular risk and specific lipid/LDL goals reviewed.  We discussed several lifestyle modifications today and Misty Benson will continue to work on diet, exercise and weight loss efforts. Misty Benson will continue Omega 3. She will increase her ratio of Omega 3 to Omega 6. Orders and follow up as documented in patient record.   Counseling Intensive lifestyle modifications are the first line treatment for this issue. Dietary changes: Increase soluble fiber. Decrease simple carbohydrates. Exercise changes: Moderate to vigorous-intensity aerobic activity 150 minutes per week if tolerated. Lipid-lowering medications: see documented in medical record.   3. Obesity, current BMI 33.5 Misty Benson is currently in the action stage of change. As such, her goal is to continue with weight loss efforts. She has agreed to the Category 2 Plan.   Misty Benson will continue meal planning. She will continue intentional eating. We discussed  protein - 10-1.  Exercise goals:  As is.  Behavioral modification strategies: increasing lean protein intake, decreasing simple carbohydrates, increasing vegetables, increasing water intake, decreasing eating out, no skipping meals, meal planning and cooking strategies, keeping healthy foods in the home, and planning for success.  Misty Benson has agreed to follow-up with our clinic in 2 weeks. She was informed of the importance of frequent follow-up visits to maximize her success with intensive lifestyle modifications for her multiple health conditions.   Objective:   Blood pressure 119/86, pulse 76, temperature 98 F (36.7 C), height '5\' 8"'$  (1.727 m), weight 220 lb (99.8 kg), last menstrual period 05/30/2012, SpO2 97 %. Body mass index is 33.45 kg/m.  General: Cooperative, alert, well developed, in no acute distress. HEENT: Conjunctivae and lids unremarkable. Cardiovascular: Regular rhythm.  Lungs: Normal work of breathing. Neurologic: No focal deficits.   Lab Results  Component Value Date   CREATININE 0.66 04/05/2021   BUN 23 04/05/2021   NA 145 (H) 04/05/2021   K 4.8 04/05/2021   CL 105 04/05/2021   CO2 23 04/05/2021   Lab Results  Component Value Date   ALT 24 04/05/2021   AST 16 04/05/2021   ALKPHOS 83 04/05/2021   BILITOT 0.3 04/05/2021   Lab Results  Component Value Date   HGBA1C 5.8 (H) 04/05/2021   Lab Results  Component Value Date   INSULIN 9.5 04/05/2021   Lab Results  Component Value Date   TSH 2.220 04/05/2021   Lab Results  Component Value Date   CHOL 227 (H) 04/05/2021   HDL 73 04/05/2021   LDLCALC 132 (H) 04/05/2021   TRIG 125 04/05/2021  CHOLHDL 3.4 02/23/2020   Lab Results  Component Value Date   VD25OH 41.1 04/05/2021   VD25OH 41 01/21/2019   VD25OH 39 07/18/2017   Lab Results  Component Value Date   WBC 5.4 02/23/2020   HGB 13.3 02/23/2020   HCT 40.3 02/23/2020   MCV 93.1 02/23/2020   PLT 280 02/23/2020   No results found for: IRON,  TIBC, FERRITIN  Attestation Statements:   Reviewed by clinician on day of visit: allergies, medications, problem list, medical history, surgical history, family history, social history, and previous encounter notes.   I, Lizbeth Bark, RMA, am acting as Location manager for [employer], DO.   I have reviewed the above documentation for accuracy and completeness, and I agree with the above. Jearld Lesch, DO

## 2021-07-31 ENCOUNTER — Other Ambulatory Visit (INDEPENDENT_AMBULATORY_CARE_PROVIDER_SITE_OTHER): Payer: Self-pay | Admitting: Bariatrics

## 2021-07-31 DIAGNOSIS — R7303 Prediabetes: Secondary | ICD-10-CM

## 2021-07-31 NOTE — Telephone Encounter (Signed)
Dr.Brown 

## 2021-08-06 ENCOUNTER — Encounter (INDEPENDENT_AMBULATORY_CARE_PROVIDER_SITE_OTHER): Payer: Self-pay

## 2021-08-06 ENCOUNTER — Other Ambulatory Visit: Payer: Self-pay | Admitting: Internal Medicine

## 2021-08-06 ENCOUNTER — Telehealth: Payer: Self-pay

## 2021-08-06 DIAGNOSIS — I10 Essential (primary) hypertension: Secondary | ICD-10-CM

## 2021-08-06 NOTE — Telephone Encounter (Signed)
Last filled 06/19/2021 Last OV 07/23/2021  Ok to fill?

## 2021-08-06 NOTE — Telephone Encounter (Signed)
Patient called requesting Rx refill amphetamine-dextroamphetamine (ADDERALL) 20 MG tablet Sent to [facility]

## 2021-08-07 ENCOUNTER — Other Ambulatory Visit (INDEPENDENT_AMBULATORY_CARE_PROVIDER_SITE_OTHER): Payer: Self-pay | Admitting: Physician Assistant

## 2021-08-07 ENCOUNTER — Other Ambulatory Visit: Payer: Self-pay

## 2021-08-07 ENCOUNTER — Ambulatory Visit (INDEPENDENT_AMBULATORY_CARE_PROVIDER_SITE_OTHER): Payer: BC Managed Care – PPO | Admitting: Physician Assistant

## 2021-08-07 ENCOUNTER — Encounter (INDEPENDENT_AMBULATORY_CARE_PROVIDER_SITE_OTHER): Payer: Self-pay | Admitting: Physician Assistant

## 2021-08-07 VITALS — BP 110/61 | HR 64 | Temp 97.6°F | Ht 68.0 in | Wt 219.0 lb

## 2021-08-07 DIAGNOSIS — M62830 Muscle spasm of back: Secondary | ICD-10-CM | POA: Diagnosis not present

## 2021-08-07 DIAGNOSIS — Z6837 Body mass index (BMI) 37.0-37.9, adult: Secondary | ICD-10-CM

## 2021-08-07 DIAGNOSIS — R7303 Prediabetes: Secondary | ICD-10-CM | POA: Diagnosis not present

## 2021-08-07 DIAGNOSIS — M9902 Segmental and somatic dysfunction of thoracic region: Secondary | ICD-10-CM | POA: Diagnosis not present

## 2021-08-07 DIAGNOSIS — Z9189 Other specified personal risk factors, not elsewhere classified: Secondary | ICD-10-CM

## 2021-08-07 DIAGNOSIS — M5440 Lumbago with sciatica, unspecified side: Secondary | ICD-10-CM | POA: Diagnosis not present

## 2021-08-07 DIAGNOSIS — M9903 Segmental and somatic dysfunction of lumbar region: Secondary | ICD-10-CM | POA: Diagnosis not present

## 2021-08-07 MED ORDER — METFORMIN HCL 500 MG PO TABS: 500.0000 mg | ORAL_TABLET | Freq: Every day | ORAL | 0 refills | Status: DC

## 2021-08-07 NOTE — Telephone Encounter (Signed)
[id number] [place], [address] AT [place] with pharmacist and there is a refill available at [facility] on [address].  Left detailed message on machine for patient.

## 2021-08-07 NOTE — Progress Notes (Signed)
Chief Complaint:   OBESITY Misty Benson is here to discuss her progress with her obesity treatment plan along with follow-up of her obesity related diagnoses. Misty Benson is on the Category 2 Plan and states she is following her eating plan approximately 85-90% of the time. Misty Benson states she is walking and doing yard work for 30-60 minutes 3-4 times per week.  Today's visit was #: 7 Starting weight: 249 lbs Starting date: 04/05/2021 Today's weight: 219 lbs Today's date: 08/07/2021 Total lbs lost to date: 30 lbs Total lbs lost since last in-office visit: 1 lb  Interim History: Misty Benson reports that she did not eat all of her food on the plan due to not feeling as hungry.  Subjective:   1. Prediabetes Misty Benson is on metformin.  No nausea, vomiting, or diarrhea.  2. At risk for diabetes mellitus Misty Benson is at higher than average risk for developing diabetes due to obesity.    Assessment/Plan:   1. Prediabetes Misty Benson will continue to work on weight loss, exercise, and decreasing simple carbohydrates to help decrease the risk of diabetes.  Refill metformin 500 mg daily, as per below.  - Refill metFORMIN (GLUCOPHAGE) 500 MG tablet; Take 1 tablet (500 mg total) by mouth daily with lunch.  Dispense: 30 tablet; Refill: 0  2. At risk for diabetes mellitus Misty Benson was given approximately 15 minutes of diabetes education and counseling today. We discussed intensive lifestyle modifications today with an emphasis on weight loss as well as increasing exercise and decreasing simple carbohydrates in her diet. We also reviewed medication options with an emphasis on risk versus benefit of those discussed.   Repetitive spaced learning was employed today to elicit superior memory formation and behavioral change.   3. Obesity, current BMI 33.3  Misty Benson is currently in the action stage of change. As such, her goal is to continue with weight loss efforts. She has agreed to the Category 2 Plan.   Exercise goals: All adults  should avoid inactivity. Some physical activity is better than none, and adults who participate in any amount of physical activity gain some health benefits.  Behavioral modification strategies: increasing lean protein intake and no skipping meals.  Misty Benson has agreed to follow-up with our clinic in 2 weeks. She was informed of the importance of frequent follow-up visits to maximize her success with intensive lifestyle modifications for her multiple health conditions.   Objective:   Blood pressure 110/61, pulse 64, temperature 97.6 F (36.4 C), height 5\' 8"  (1.727 m), weight 219 lb (99.3 kg), last menstrual period 05/30/2012, SpO2 96 %. Body mass index is 33.3 kg/m.  General: Cooperative, alert, well developed, in no acute distress. HEENT: Conjunctivae and lids unremarkable. Cardiovascular: Regular rhythm.  Lungs: Normal work of breathing. Neurologic: No focal deficits.   Lab Results  Component Value Date   CREATININE 0.66 04/05/2021   BUN 23 04/05/2021   NA 145 (H) 04/05/2021   K 4.8 04/05/2021   CL 105 04/05/2021   CO2 23 04/05/2021   Lab Results  Component Value Date   ALT 24 04/05/2021   AST 16 04/05/2021   ALKPHOS 83 04/05/2021   BILITOT 0.3 04/05/2021   Lab Results  Component Value Date   HGBA1C 5.8 (H) 04/05/2021   Lab Results  Component Value Date   INSULIN 9.5 04/05/2021   Lab Results  Component Value Date   TSH 2.220 04/05/2021   Lab Results  Component Value Date   CHOL 227 (H) 04/05/2021   HDL 73  04/05/2021   LDLCALC 132 (H) 04/05/2021   TRIG 125 04/05/2021   CHOLHDL 3.4 02/23/2020   Lab Results  Component Value Date   VD25OH 41.1 04/05/2021   VD25OH 41 01/21/2019   VD25OH 39 07/18/2017   Lab Results  Component Value Date   WBC 5.4 02/23/2020   HGB 13.3 02/23/2020   HCT 40.3 02/23/2020   MCV 93.1 02/23/2020   PLT 280 02/23/2020   Attestation Statements:   Reviewed by clinician on day of visit: allergies, medications, problem list,  medical history, surgical history, family history, social history, and previous encounter notes.  I, Water quality scientist, CMA, am acting as transcriptionist for Abby Potash, PA-C  I have reviewed the above documentation for accuracy and completeness, and I agree with the above. Abby Potash, PA-C

## 2021-08-08 DIAGNOSIS — M9902 Segmental and somatic dysfunction of thoracic region: Secondary | ICD-10-CM | POA: Diagnosis not present

## 2021-08-08 DIAGNOSIS — M9903 Segmental and somatic dysfunction of lumbar region: Secondary | ICD-10-CM | POA: Diagnosis not present

## 2021-08-08 DIAGNOSIS — M5440 Lumbago with sciatica, unspecified side: Secondary | ICD-10-CM | POA: Diagnosis not present

## 2021-08-08 DIAGNOSIS — M62830 Muscle spasm of back: Secondary | ICD-10-CM | POA: Diagnosis not present

## 2021-08-09 ENCOUNTER — Other Ambulatory Visit: Payer: Self-pay | Admitting: Internal Medicine

## 2021-08-09 DIAGNOSIS — F900 Attention-deficit hyperactivity disorder, predominantly inattentive type: Secondary | ICD-10-CM

## 2021-08-09 MED ORDER — AMPHETAMINE-DEXTROAMPHETAMINE 20 MG PO TABS: 20.0000 mg | ORAL_TABLET | Freq: Two times a day (BID) | ORAL | 0 refills | Status: DC

## 2021-08-09 NOTE — Telephone Encounter (Signed)
Patient called again to have adderall 20mg  sent in to [facility] on [address] in [place], number is [phone]. Patient would like a 60 to 90 day supply if possible because of the shortage.    Good callback number is [phone]

## 2021-08-09 NOTE — Telephone Encounter (Signed)
Rx sent 

## 2021-08-09 NOTE — Telephone Encounter (Signed)
Patient called nurse line stating she called on Monday about getting her Adderall refilled.  [facility] is out of the medication so she would like the Medication sent to [place] [phone].

## 2021-08-09 NOTE — Telephone Encounter (Signed)
Spoke with patient and explained insurance may not cover 90 days.

## 2021-08-09 NOTE — Telephone Encounter (Signed)
Okay to send [facility] and a 90 day?

## 2021-08-13 DIAGNOSIS — M9903 Segmental and somatic dysfunction of lumbar region: Secondary | ICD-10-CM | POA: Diagnosis not present

## 2021-08-13 DIAGNOSIS — M5440 Lumbago with sciatica, unspecified side: Secondary | ICD-10-CM | POA: Diagnosis not present

## 2021-08-13 DIAGNOSIS — M9902 Segmental and somatic dysfunction of thoracic region: Secondary | ICD-10-CM | POA: Diagnosis not present

## 2021-08-13 DIAGNOSIS — M62830 Muscle spasm of back: Secondary | ICD-10-CM | POA: Diagnosis not present

## 2021-08-15 DIAGNOSIS — M9902 Segmental and somatic dysfunction of thoracic region: Secondary | ICD-10-CM | POA: Diagnosis not present

## 2021-08-15 DIAGNOSIS — M62830 Muscle spasm of back: Secondary | ICD-10-CM | POA: Diagnosis not present

## 2021-08-15 DIAGNOSIS — M5440 Lumbago with sciatica, unspecified side: Secondary | ICD-10-CM | POA: Diagnosis not present

## 2021-08-15 DIAGNOSIS — M9903 Segmental and somatic dysfunction of lumbar region: Secondary | ICD-10-CM | POA: Diagnosis not present

## 2021-08-20 DIAGNOSIS — L570 Actinic keratosis: Secondary | ICD-10-CM | POA: Diagnosis not present

## 2021-08-20 DIAGNOSIS — Z85820 Personal history of malignant melanoma of skin: Secondary | ICD-10-CM | POA: Diagnosis not present

## 2021-08-20 DIAGNOSIS — Z85828 Personal history of other malignant neoplasm of skin: Secondary | ICD-10-CM | POA: Diagnosis not present

## 2021-08-20 DIAGNOSIS — L738 Other specified follicular disorders: Secondary | ICD-10-CM | POA: Diagnosis not present

## 2021-08-21 ENCOUNTER — Other Ambulatory Visit: Payer: Self-pay

## 2021-08-21 ENCOUNTER — Encounter (INDEPENDENT_AMBULATORY_CARE_PROVIDER_SITE_OTHER): Payer: Self-pay | Admitting: Bariatrics

## 2021-08-21 ENCOUNTER — Ambulatory Visit (INDEPENDENT_AMBULATORY_CARE_PROVIDER_SITE_OTHER): Payer: BC Managed Care – PPO | Admitting: Bariatrics

## 2021-08-21 VITALS — BP 118/73 | HR 68 | Temp 97.8°F | Ht 68.0 in | Wt 215.0 lb

## 2021-08-21 DIAGNOSIS — E7849 Other hyperlipidemia: Secondary | ICD-10-CM

## 2021-08-21 DIAGNOSIS — I10 Essential (primary) hypertension: Secondary | ICD-10-CM | POA: Diagnosis not present

## 2021-08-21 DIAGNOSIS — M5440 Lumbago with sciatica, unspecified side: Secondary | ICD-10-CM | POA: Diagnosis not present

## 2021-08-21 DIAGNOSIS — Z6837 Body mass index (BMI) 37.0-37.9, adult: Secondary | ICD-10-CM

## 2021-08-21 DIAGNOSIS — M9903 Segmental and somatic dysfunction of lumbar region: Secondary | ICD-10-CM | POA: Diagnosis not present

## 2021-08-21 DIAGNOSIS — M62830 Muscle spasm of back: Secondary | ICD-10-CM | POA: Diagnosis not present

## 2021-08-21 DIAGNOSIS — M9902 Segmental and somatic dysfunction of thoracic region: Secondary | ICD-10-CM | POA: Diagnosis not present

## 2021-08-21 NOTE — Progress Notes (Signed)
Chief Complaint:   OBESITY Misty Benson is here to discuss her progress with her obesity treatment plan along with follow-up of her obesity related diagnoses. Misty Benson is on the Category 2 Plan and states she is following her eating plan approximately 85% of the time. Misty Benson states she is doing 0 minutes 0 times per week.  Today's visit was #: 8 Starting weight: 249 lbs Starting date: 04/05/2021 Today's weight: 215 lbs Today's date: 08/21/2021 Total lbs lost to date: 34 lbs Total lbs lost since last in-office visit: 4 lbs  Interim History: Misty Benson is down 4 lbs since her last visit. She is getting in more protein in.   Subjective:   1. Essential hypertension Misty Benson is taking Microzide and Calan-SR currently. Her blood pressure is controlled.  2. Other hyperlipidemia Misty Benson is currently taking Omega 3.  Assessment/Plan:   1. Essential hypertension Misty Benson will continue her medications. She is working on healthy weight loss and exercise to improve blood pressure control. We will watch for signs of hypotension as she continues her lifestyle modifications.  2. Other hyperlipidemia Cardiovascular risk and specific lipid/LDL goals reviewed.  We discussed several lifestyle modifications today and Misty Benson will continue to work on diet, exercise and weight loss efforts. Misty Benson will continue medications. Orders and follow up as documented in patient record.   Counseling Intensive lifestyle modifications are the first line treatment for this issue. Dietary changes: Increase soluble fiber. Decrease simple carbohydrates. Exercise changes: Moderate to vigorous-intensity aerobic activity 150 minutes per week if tolerated. Lipid-lowering medications: see documented in medical record.   3. Obesity, current BMI of 32.7 Misty Benson is currently in the action stage of change. As such, her goal is to continue with weight loss efforts. She has agreed to the Category 3 Plan.   Misty Benson will continue meal planning and be  mindful of her eating. eating.   Exercise goals:  Misty Benson is having back pain. She will do less exercise.  Behavioral modification strategies: increasing lean protein intake, decreasing simple carbohydrates, increasing vegetables, increasing water intake, decreasing eating out, no skipping meals, meal planning and cooking strategies, keeping healthy foods in the home, and planning for success.  Misty Benson has agreed to follow-up with our clinic in 3 weeks (fasting) with myself or Misty Benson, PAC. She was informed of the importance of frequent follow-up visits to maximize her success with intensive lifestyle modifications for her multiple health conditions.   Objective:   Blood pressure 118/73, pulse 68, temperature 97.8 F (36.6 C), height 5\' 8"  (1.727 m), weight 215 lb (97.5 kg), last menstrual period 05/30/2012, SpO2 98 %. Body mass index is 32.69 kg/m.  General: Cooperative, alert, well developed, in no acute distress. HEENT: Conjunctivae and lids unremarkable. Cardiovascular: Regular rhythm.  Lungs: Normal work of breathing. Neurologic: No focal deficits.   Lab Results  Component Value Date   CREATININE 0.66 04/05/2021   BUN 23 04/05/2021   NA 145 (H) 04/05/2021   K 4.8 04/05/2021   CL 105 04/05/2021   CO2 23 04/05/2021   Lab Results  Component Value Date   ALT 24 04/05/2021   AST 16 04/05/2021   ALKPHOS 83 04/05/2021   BILITOT 0.3 04/05/2021   Lab Results  Component Value Date   HGBA1C 5.8 (H) 04/05/2021   Lab Results  Component Value Date   INSULIN 9.5 04/05/2021   Lab Results  Component Value Date   TSH 2.220 04/05/2021   Lab Results  Component Value Date   CHOL 227 (H)  04/05/2021   HDL 73 04/05/2021   LDLCALC 132 (H) 04/05/2021   TRIG 125 04/05/2021   CHOLHDL 3.4 02/23/2020   Lab Results  Component Value Date   VD25OH 41.1 04/05/2021   VD25OH 41 01/21/2019   VD25OH 39 07/18/2017   Lab Results  Component Value Date   WBC 5.4 02/23/2020   HGB 13.3  02/23/2020   HCT 40.3 02/23/2020   MCV 93.1 02/23/2020   PLT 280 02/23/2020   No results found for: IRON, TIBC, FERRITIN  Attestation Statements:   Reviewed by clinician on day of visit: allergies, medications, problem list, medical history, surgical history, family history, social history, and previous encounter notes.   I, Misty Benson, RMA, am acting as Location manager for [employer], DO.   I have reviewed the above documentation for accuracy and completeness, and I agree with the above. Misty Lesch, DO

## 2021-08-22 DIAGNOSIS — M62830 Muscle spasm of back: Secondary | ICD-10-CM | POA: Diagnosis not present

## 2021-08-22 DIAGNOSIS — M9903 Segmental and somatic dysfunction of lumbar region: Secondary | ICD-10-CM | POA: Diagnosis not present

## 2021-08-22 DIAGNOSIS — M5440 Lumbago with sciatica, unspecified side: Secondary | ICD-10-CM | POA: Diagnosis not present

## 2021-08-22 DIAGNOSIS — M9902 Segmental and somatic dysfunction of thoracic region: Secondary | ICD-10-CM | POA: Diagnosis not present

## 2021-08-23 ENCOUNTER — Encounter (INDEPENDENT_AMBULATORY_CARE_PROVIDER_SITE_OTHER): Payer: Self-pay | Admitting: Bariatrics

## 2021-09-04 ENCOUNTER — Other Ambulatory Visit (INDEPENDENT_AMBULATORY_CARE_PROVIDER_SITE_OTHER): Payer: Self-pay | Admitting: Physician Assistant

## 2021-09-04 DIAGNOSIS — R7303 Prediabetes: Secondary | ICD-10-CM

## 2021-09-04 NOTE — Telephone Encounter (Signed)
Last seen by Dr Owens Shark on 08/21/21

## 2021-09-04 NOTE — Telephone Encounter (Signed)
LAST APPOINTMENT DATE: 08/21/21 NEXT APPOINTMENT DATE: 09/11/21   [id number] [place], [address] AT [address] [place] [address] Phone: [phone] Fax: [phone]  [place] [address] Phone: [phone] Fax: [phone]  [id number] [place], [address] AT [place] [address] [place] [address] Phone: [phone] Fax: [facility] # [address] [place] [address] Phone: [phone] Fax: [phone]  Patient is requesting a refill of the following medications: Pending Prescriptions:                       Disp   Refills   metFORMIN (GLUCOPHAGE) 500 MG tablet [Phar*30 tab*0       Sig: TAKE 1 TABLET(500 MG) BY MOUTH DAILY WITH LUNCH   Date last filled: 08/07/21 Previously prescribed by Olivia Mackie A  Lab Results      Component                Value               Date                      HGBA1C                   5.8 (H)             04/05/2021           Lab Results      Component                Value               Date                      LDLCALC                  132 (H)             04/05/2021                CREATININE               0.66                04/05/2021           Lab Results      Component                Value               Date                      VD25OH                   41.1                04/05/2021                VD25OH                   41                  01/21/2019  VD25OH                   39                  07/18/2017            BP Readings from Last 3 Encounters: 08/21/21 : 118/73 08/07/21 : 110/61 07/23/21 : 119/86

## 2021-09-11 ENCOUNTER — Other Ambulatory Visit: Payer: Self-pay

## 2021-09-11 ENCOUNTER — Ambulatory Visit (INDEPENDENT_AMBULATORY_CARE_PROVIDER_SITE_OTHER): Payer: BC Managed Care – PPO | Admitting: Bariatrics

## 2021-09-11 ENCOUNTER — Encounter (INDEPENDENT_AMBULATORY_CARE_PROVIDER_SITE_OTHER): Payer: Self-pay | Admitting: Bariatrics

## 2021-09-11 VITALS — BP 128/77 | HR 70 | Temp 98.8°F | Ht 68.0 in | Wt 211.0 lb

## 2021-09-11 DIAGNOSIS — Z6837 Body mass index (BMI) 37.0-37.9, adult: Secondary | ICD-10-CM

## 2021-09-11 DIAGNOSIS — R7303 Prediabetes: Secondary | ICD-10-CM

## 2021-09-11 DIAGNOSIS — E7849 Other hyperlipidemia: Secondary | ICD-10-CM | POA: Diagnosis not present

## 2021-09-11 NOTE — Progress Notes (Signed)
Chief Complaint:   OBESITY Misty Benson is here to discuss her progress with her obesity treatment plan along with follow-up of her obesity related diagnoses. Misty Benson is on the Category 2 Plan and states she is following her eating plan approximately 35% of the time. Misty Benson states she is walking for 30 minutes 2 times per week.  Today's visit was #: 9 Starting weight: 249 lbs Starting date: 04/05/2021 Today's weight: 211 lbs Today's date: 09/11/2021 Total lbs lost to date: 38 lbs Total lbs lost since last in-office visit: 4 lbs  Interim History: Misty Benson is down another 4 lbs from the last visit. She has been out of town which has made focusing on the plan, but she states that she has been paying attention to calories.   Subjective:   1. Other hyperlipidemia Misty Benson is currently taking Omega 3.   2. Prediabetes Misty Benson is taking currently Metformin.   Assessment/Plan:   1. Other hyperlipidemia Cardiovascular risk and specific lipid/LDL goals reviewed.  We discussed several lifestyle modifications today and Misty Benson will continue to work on diet, exercise and weight loss efforts. Misty Benson will continue taking Omega 3. She will have zero trans fats and limited saturated fats. Orders and follow up as documented in patient record.   Counseling Intensive lifestyle modifications are the first line treatment for this issue. Dietary changes: Increase soluble fiber. Decrease simple carbohydrates. Exercise changes: Moderate to vigorous-intensity aerobic activity 150 minutes per week if tolerated. Lipid-lowering medications: see documented in medical record.  2. Prediabetes Misty Benson will continue taking Metformin. She will continue to work on weight loss, exercise, and decreasing simple carbohydrates to help decrease the risk of diabetes.   3. Obesity BMI today 54 Misty Benson is currently in the action stage of change. As such, her goal is to continue with weight loss efforts. She has agreed to the Category 2 Plan.    Misty Benson will continue meal planning and intentional eating. She will increase her water intake. She will keep her protein high.   Exercise goals:  As is.  Behavioral modification strategies: increasing lean protein intake, decreasing simple carbohydrates, increasing vegetables, increasing water intake, decreasing eating out, no skipping meals, meal planning and cooking strategies, keeping healthy foods in the home, and planning for success.  Misty Benson has agreed to follow-up with our clinic in 3 weeks (fasting). She was informed of the importance of frequent follow-up visits to maximize her success with intensive lifestyle modifications for her multiple health conditions.   Objective:   Blood pressure 128/77, pulse 70, temperature 98.8 F (37.1 C), height 5\' 8"  (1.727 m), weight 211 lb (95.7 kg), last menstrual period 05/30/2012, SpO2 97 %. Body mass index is 32.08 kg/m.  General: Cooperative, alert, well developed, in no acute distress. HEENT: Conjunctivae and lids unremarkable. Cardiovascular: Regular rhythm.  Lungs: Normal work of breathing. Neurologic: No focal deficits.   Lab Results  Component Value Date   CREATININE 0.66 04/05/2021   BUN 23 04/05/2021   NA 145 (H) 04/05/2021   K 4.8 04/05/2021   CL 105 04/05/2021   CO2 23 04/05/2021   Lab Results  Component Value Date   ALT 24 04/05/2021   AST 16 04/05/2021   ALKPHOS 83 04/05/2021   BILITOT 0.3 04/05/2021   Lab Results  Component Value Date   HGBA1C 5.8 (H) 04/05/2021   Lab Results  Component Value Date   INSULIN 9.5 04/05/2021   Lab Results  Component Value Date   TSH 2.220 04/05/2021   Lab  Results  Component Value Date   CHOL 227 (H) 04/05/2021   HDL 73 04/05/2021   LDLCALC 132 (H) 04/05/2021   TRIG 125 04/05/2021   CHOLHDL 3.4 02/23/2020   Lab Results  Component Value Date   VD25OH 41.1 04/05/2021   VD25OH 41 01/21/2019   VD25OH 39 07/18/2017   Lab Results  Component Value Date   WBC 5.4  02/23/2020   HGB 13.3 02/23/2020   HCT 40.3 02/23/2020   MCV 93.1 02/23/2020   PLT 280 02/23/2020   No results found for: IRON, TIBC, FERRITIN  Attestation Statements:   Reviewed by clinician on day of visit: allergies, medications, problem list, medical history, surgical history, family history, social history, and previous encounter notes.  I, Lizbeth Bark, RMA, am acting as Location manager for [employer], DO.   I have reviewed the above documentation for accuracy and completeness, and I agree with the above. Jearld Lesch, DO

## 2021-09-12 ENCOUNTER — Encounter (INDEPENDENT_AMBULATORY_CARE_PROVIDER_SITE_OTHER): Payer: Self-pay | Admitting: Bariatrics

## 2021-09-26 DIAGNOSIS — M9902 Segmental and somatic dysfunction of thoracic region: Secondary | ICD-10-CM | POA: Diagnosis not present

## 2021-09-26 DIAGNOSIS — M62830 Muscle spasm of back: Secondary | ICD-10-CM | POA: Diagnosis not present

## 2021-09-26 DIAGNOSIS — M9903 Segmental and somatic dysfunction of lumbar region: Secondary | ICD-10-CM | POA: Diagnosis not present

## 2021-09-26 DIAGNOSIS — M5440 Lumbago with sciatica, unspecified side: Secondary | ICD-10-CM | POA: Diagnosis not present

## 2021-10-01 ENCOUNTER — Encounter (INDEPENDENT_AMBULATORY_CARE_PROVIDER_SITE_OTHER): Payer: Self-pay | Admitting: Bariatrics

## 2021-10-01 DIAGNOSIS — M62830 Muscle spasm of back: Secondary | ICD-10-CM | POA: Diagnosis not present

## 2021-10-01 DIAGNOSIS — M5440 Lumbago with sciatica, unspecified side: Secondary | ICD-10-CM | POA: Diagnosis not present

## 2021-10-01 DIAGNOSIS — M9902 Segmental and somatic dysfunction of thoracic region: Secondary | ICD-10-CM | POA: Diagnosis not present

## 2021-10-01 DIAGNOSIS — M9903 Segmental and somatic dysfunction of lumbar region: Secondary | ICD-10-CM | POA: Diagnosis not present

## 2021-10-01 NOTE — Telephone Encounter (Signed)
Please review

## 2021-10-02 ENCOUNTER — Ambulatory Visit (INDEPENDENT_AMBULATORY_CARE_PROVIDER_SITE_OTHER): Payer: BC Managed Care – PPO | Admitting: Bariatrics

## 2021-10-02 ENCOUNTER — Other Ambulatory Visit: Payer: Self-pay

## 2021-10-02 ENCOUNTER — Encounter (INDEPENDENT_AMBULATORY_CARE_PROVIDER_SITE_OTHER): Payer: Self-pay | Admitting: Bariatrics

## 2021-10-02 VITALS — BP 115/60 | HR 60 | Temp 97.9°F | Ht 68.0 in | Wt 208.0 lb

## 2021-10-02 DIAGNOSIS — Z6837 Body mass index (BMI) 37.0-37.9, adult: Secondary | ICD-10-CM

## 2021-10-02 DIAGNOSIS — E7849 Other hyperlipidemia: Secondary | ICD-10-CM | POA: Diagnosis not present

## 2021-10-02 DIAGNOSIS — R7303 Prediabetes: Secondary | ICD-10-CM | POA: Diagnosis not present

## 2021-10-02 DIAGNOSIS — I10 Essential (primary) hypertension: Secondary | ICD-10-CM | POA: Diagnosis not present

## 2021-10-02 NOTE — Progress Notes (Signed)
Chief Complaint:   OBESITY Misty Benson is here to discuss her progress with her obesity treatment plan along with follow-up of her obesity related diagnoses. Misty Benson is on the Category 2 Plan and states she is following her eating plan approximately 80-85% of the time. Misty Benson states she is walking for 60 minutes 1-2 times per week.  Today's visit was #: 10 Starting weight: 249 lbs Starting date: 04/05/2021 Today's weight: 208 lbs Today's date: 10/02/2021 Total lbs lost to date: 41 lbs Total lbs lost since last in-office visit: 3 lbs  Interim History: Misty Benson is down another 3 lbs and doing very well overall. She felt like she has hit a plateau.   Subjective:   1. Prediabetes Misty Benson is currently taking Metformin.  2. Essential hypertension Misty Benson's blood pressure is controlled. Her last blood pressure was 115/60.  3. Other hyperlipidemia Misty Benson is taking Omega 3 currently.  Assessment/Plan:   1. Prediabetes Misty Benson will continue Metformin. We will check CMP and A1C today. She will continue to work on weight loss, exercise, and decreasing simple carbohydrates to help decrease the risk of diabetes.   - Hemoglobin A1c - Comprehensive metabolic panel  2. Essential hypertension Misty Benson will continue her medications. She is working on healthy weight loss and exercise to improve blood pressure control. We will watch for signs of hypotension as she continues her lifestyle modifications.  3. Other hyperlipidemia Cardiovascular risk and specific lipid/LDL goals reviewed.  We discussed several lifestyle modifications today and Misty Benson will continue to work on diet, exercise and weight loss efforts. We will check Lipids today. Misty Benson will continue taking Omega 3. Orders and follow up as documented in patient record.   Counseling Intensive lifestyle modifications are the first line treatment for this issue. Dietary changes: Increase soluble fiber. Decrease simple carbohydrates. Exercise changes:  Moderate to vigorous-intensity aerobic activity 150 minutes per week if tolerated. Lipid-lowering medications: see documented in medical record.  - Lipid Panel With LDL/HDL Ratio - Comprehensive metabolic panel  4. Obesity BMI today 31.7 Misty Benson is currently in the action stage of change. As such, her goal is to continue with weight loss efforts. She has agreed to the Category 2 Plan.   Misty Benson will continue meal planning and she will continue intentional eating. Strategies for the holidays were provided.  Exercise goals:  As is.  Behavioral modification strategies: increasing lean protein intake, decreasing simple carbohydrates, increasing vegetables, increasing water intake, decreasing eating out, no skipping meals, meal planning and cooking strategies, keeping healthy foods in the home, and planning for success.  Misty Benson has agreed to follow-up with our clinic in 4 weeks. She was informed of the importance of frequent follow-up visits to maximize her success with intensive lifestyle modifications for her multiple health conditions.   Misty Benson was informed we would discuss her lab results at her next visit unless there is a critical issue that needs to be addressed sooner. Misty Benson agreed to keep her next visit at the agreed upon time to discuss these results.  Objective:   Blood pressure 115/60, pulse 60, temperature 97.9 F (36.6 C), height 5\' 8"  (1.727 m), weight 208 lb (94.3 kg), last menstrual period 05/30/2012, SpO2 98 %. Body mass index is 31.63 kg/m.  General: Cooperative, alert, well developed, in no acute distress. HEENT: Conjunctivae and lids unremarkable. Cardiovascular: Regular rhythm.  Lungs: Normal work of breathing. Neurologic: No focal deficits.   Lab Results  Component Value Date   CREATININE 0.66 04/05/2021   BUN 23 04/05/2021  NA 145 (H) 04/05/2021   K 4.8 04/05/2021   CL 105 04/05/2021   CO2 23 04/05/2021   Lab Results  Component Value Date   ALT 24 04/05/2021    AST 16 04/05/2021   ALKPHOS 83 04/05/2021   BILITOT 0.3 04/05/2021   Lab Results  Component Value Date   HGBA1C 5.8 (H) 04/05/2021   Lab Results  Component Value Date   INSULIN 9.5 04/05/2021   Lab Results  Component Value Date   TSH 2.220 04/05/2021   Lab Results  Component Value Date   CHOL 227 (H) 04/05/2021   HDL 73 04/05/2021   LDLCALC 132 (H) 04/05/2021   TRIG 125 04/05/2021   CHOLHDL 3.4 02/23/2020   Lab Results  Component Value Date   VD25OH 41.1 04/05/2021   VD25OH 41 01/21/2019   VD25OH 39 07/18/2017   Lab Results  Component Value Date   WBC 5.4 02/23/2020   HGB 13.3 02/23/2020   HCT 40.3 02/23/2020   MCV 93.1 02/23/2020   PLT 280 02/23/2020   No results found for: IRON, TIBC, FERRITIN  Attestation Statements:   Reviewed by clinician on day of visit: allergies, medications, problem list, medical history, surgical history, family history, social history, and previous encounter notes.  I, Lizbeth Bark, RMA, am acting as Location manager for [employer], DO.   I have reviewed the above documentation for accuracy and completeness, and I agree with the above. Jearld Lesch, DO

## 2021-10-03 LAB — COMPREHENSIVE METABOLIC PANEL
ALT: 27 IU/L (ref 0–32)
AST: 17 IU/L (ref 0–40)
Albumin/Globulin Ratio: 2 (ref 1.2–2.2)
Albumin: 4.5 g/dL (ref 3.8–4.8)
Alkaline Phosphatase: 60 IU/L (ref 44–121)
BUN/Creatinine Ratio: 30 — ABNORMAL HIGH (ref 12–28)
BUN: 22 mg/dL (ref 8–27)
Bilirubin Total: 0.4 mg/dL (ref 0.0–1.2)
CO2: 27 mmol/L (ref 20–29)
Calcium: 9.7 mg/dL (ref 8.7–10.3)
Chloride: 101 mmol/L (ref 96–106)
Creatinine, Ser: 0.73 mg/dL (ref 0.57–1.00)
Globulin, Total: 2.2 g/dL (ref 1.5–4.5)
Glucose: 104 mg/dL — ABNORMAL HIGH (ref 70–99)
Potassium: 4.5 mmol/L (ref 3.5–5.2)
Sodium: 138 mmol/L (ref 134–144)
Total Protein: 6.7 g/dL (ref 6.0–8.5)
eGFR: 94 mL/min/{1.73_m2} (ref >59)

## 2021-10-03 LAB — HEMOGLOBIN A1C
Est. average glucose Bld gHb Est-mCnc: 108 mg/dL
Hgb A1c MFr Bld: 5.4 % (ref 4.8–5.6)

## 2021-10-03 LAB — LIPID PANEL WITH LDL/HDL RATIO
Cholesterol, Total: 231 mg/dL — ABNORMAL HIGH (ref 100–199)
HDL: 82 mg/dL (ref >39)
LDL Chol Calc (NIH): 134 mg/dL — ABNORMAL HIGH (ref 0–99)
LDL/HDL Ratio: 1.6 ratio (ref 0.0–3.2)
Triglycerides: 90 mg/dL (ref 0–149)
VLDL Cholesterol Cal: 15 mg/dL (ref 5–40)

## 2021-10-09 ENCOUNTER — Other Ambulatory Visit: Payer: Self-pay | Admitting: Internal Medicine

## 2021-10-09 ENCOUNTER — Other Ambulatory Visit (INDEPENDENT_AMBULATORY_CARE_PROVIDER_SITE_OTHER): Payer: Self-pay | Admitting: Bariatrics

## 2021-10-09 DIAGNOSIS — R7303 Prediabetes: Secondary | ICD-10-CM

## 2021-10-09 DIAGNOSIS — F900 Attention-deficit hyperactivity disorder, predominantly inattentive type: Secondary | ICD-10-CM

## 2021-10-09 NOTE — Telephone Encounter (Signed)
Pt last seen by Dr. Brown.  

## 2021-10-09 NOTE — Telephone Encounter (Signed)
Patient needs a refill on Adderall.  She is requesting this prescription to be called in the [facility] on [address].  They have it in stock.

## 2021-10-09 NOTE — Telephone Encounter (Signed)
Patient had an office visit 10/02/21.  I spoke with the pharmacist and there are no available refills for the patient.  Please fill 3 prescriptions

## 2021-10-10 ENCOUNTER — Telehealth: Payer: Self-pay | Admitting: Internal Medicine

## 2021-10-10 MED ORDER — AMPHETAMINE-DEXTROAMPHETAMINE 20 MG PO TABS: 20.0000 mg | ORAL_TABLET | Freq: Two times a day (BID) | ORAL | 0 refills | Status: DC

## 2021-10-10 MED ORDER — AMPHETAMINE-DEXTROAMPHETAMINE 20 MG PO TABS: 20.0000 mg | ORAL_TABLET | Freq: Every day | ORAL | 0 refills | Status: DC

## 2021-10-10 NOTE — Addendum Note (Signed)
Addended by: Westley Hummer B on: 10/10/2021 11:23 AM   Modules accepted: Orders

## 2021-10-10 NOTE — Telephone Encounter (Signed)
Please resend.  thanks

## 2021-10-10 NOTE — Telephone Encounter (Signed)
Pt is calling and [facility] has been unable to get adderall in stock and rx amphetamine-dextroamphetamine (ADDERALL) 20 MG tablet. Pt take medication twice a day  needs to go to  [facility] # [place], [place] Phone:  [phone]  Fax:  [phone]

## 2021-10-10 NOTE — Telephone Encounter (Signed)
Stephanie from [facility] called because they got three prescriptions in for amphetamine-dextroamphetamine (ADDERALL) 20 MG tablet and while two of them are the same, one has different instructions for patient to take one tablet only once a day. [facility] is asking for correction on the prescription that is different.    Good callback number is [phone]    Please advise

## 2021-10-11 ENCOUNTER — Telehealth: Payer: Self-pay

## 2021-10-11 NOTE — Telephone Encounter (Signed)
---  Caller states in the past hour or so she has been feeling very nauseous and dizzy, started out of nowhere. States she has been doing computer and paperwork all day but this is worse than she has felt before. Latest blood pressure reading 149/79 with a pulse of 61.  10/10/2021 4:41:46 PM Go to ED Now Yes Linward Headland, RN, [place] - ED  10/11/21 1133: Pt states she did not go to the ED. States she tracked her BP yesteday afternoon. Pt states she was experiencing nausea at that time & took Zofran that she had leftover "from something" & stated that she started to feel better afterwards. Pt states she feels "foggy" today with no dizziness or nausea. Pt advised that if symptoms return she will need to make an appt with PCP. Pt verb understanding.  Of note, pt has not had a CPE. Pt notified of this & appt scheduled.

## 2021-10-16 DIAGNOSIS — M1612 Unilateral primary osteoarthritis, left hip: Secondary | ICD-10-CM | POA: Diagnosis not present

## 2021-10-16 DIAGNOSIS — M7661 Achilles tendinitis, right leg: Secondary | ICD-10-CM | POA: Diagnosis not present

## 2021-10-22 DIAGNOSIS — M5440 Lumbago with sciatica, unspecified side: Secondary | ICD-10-CM | POA: Diagnosis not present

## 2021-10-22 DIAGNOSIS — M62830 Muscle spasm of back: Secondary | ICD-10-CM | POA: Diagnosis not present

## 2021-10-22 DIAGNOSIS — M9903 Segmental and somatic dysfunction of lumbar region: Secondary | ICD-10-CM | POA: Diagnosis not present

## 2021-10-22 DIAGNOSIS — M9902 Segmental and somatic dysfunction of thoracic region: Secondary | ICD-10-CM | POA: Diagnosis not present

## 2021-10-23 DIAGNOSIS — M1612 Unilateral primary osteoarthritis, left hip: Secondary | ICD-10-CM | POA: Diagnosis not present

## 2021-10-24 ENCOUNTER — Ambulatory Visit (INDEPENDENT_AMBULATORY_CARE_PROVIDER_SITE_OTHER): Payer: BC Managed Care – PPO | Admitting: Internal Medicine

## 2021-10-24 ENCOUNTER — Encounter: Payer: Self-pay | Admitting: Internal Medicine

## 2021-10-24 VITALS — BP 138/78 | HR 70 | Temp 98.5°F | Ht 69.0 in | Wt 211.0 lb

## 2021-10-24 DIAGNOSIS — Z0000 Encounter for general adult medical examination without abnormal findings: Secondary | ICD-10-CM | POA: Diagnosis not present

## 2021-10-24 DIAGNOSIS — F900 Attention-deficit hyperactivity disorder, predominantly inattentive type: Secondary | ICD-10-CM | POA: Diagnosis not present

## 2021-10-24 DIAGNOSIS — M9902 Segmental and somatic dysfunction of thoracic region: Secondary | ICD-10-CM | POA: Diagnosis not present

## 2021-10-24 DIAGNOSIS — E7849 Other hyperlipidemia: Secondary | ICD-10-CM | POA: Diagnosis not present

## 2021-10-24 DIAGNOSIS — M5440 Lumbago with sciatica, unspecified side: Secondary | ICD-10-CM | POA: Diagnosis not present

## 2021-10-24 DIAGNOSIS — F339 Major depressive disorder, recurrent, unspecified: Secondary | ICD-10-CM

## 2021-10-24 DIAGNOSIS — I10 Essential (primary) hypertension: Secondary | ICD-10-CM | POA: Diagnosis not present

## 2021-10-24 DIAGNOSIS — M9903 Segmental and somatic dysfunction of lumbar region: Secondary | ICD-10-CM | POA: Diagnosis not present

## 2021-10-24 DIAGNOSIS — M62830 Muscle spasm of back: Secondary | ICD-10-CM | POA: Diagnosis not present

## 2021-10-24 DIAGNOSIS — R7303 Prediabetes: Secondary | ICD-10-CM

## 2021-10-24 MED ORDER — AMPHETAMINE-DEXTROAMPHETAMINE 20 MG PO TABS
20.0000 mg | ORAL_TABLET | Freq: Two times a day (BID) | ORAL | 0 refills | Status: DC
Start: 2021-10-24 — End: 2021-10-30

## 2021-10-24 MED ORDER — AMPHETAMINE-DEXTROAMPHETAMINE 20 MG PO TABS
20.0000 mg | ORAL_TABLET | Freq: Two times a day (BID) | ORAL | 0 refills | Status: DC
Start: 2021-10-24 — End: 2022-03-11

## 2021-10-24 MED ORDER — AMPHETAMINE-DEXTROAMPHETAMINE 20 MG PO TABS: 20.0000 mg | ORAL_TABLET | Freq: Two times a day (BID) | ORAL | 0 refills | Status: DC

## 2021-10-24 NOTE — Progress Notes (Signed)
Established Patient Office Visit     This visit occurred during the SARS-CoV-2 public health emergency.  Safety protocols were in place, including screening questions prior to the visit, additional usage of staff PPE, and extensive cleaning of exam room while observing appropriate contact time as indicated for disinfecting solutions.    CC/Reason for Visit: Annual preventive exam  HPI: Misty Benson is a 60 y.o. female who is coming in today for the above mentioned reasons. Past Medical History is significant for: ADHD, obesity, hypertension, depression, hyperlipidemia.  She has lost around 40 pounds through weight and wellness.  All immunizations are up-to-date.  She had a colonoscopy in 2021 at [facility].  She recently had an injection of her left hip for osteoarthritis.  She has routine eye and dental care.   Past Medical/Surgical History: Past Medical History:  Diagnosis Date   ADHD (attention deficit hyperactivity disorder)    Anxiety    Back pain    Cancer ([place])    BASAL CELL FACE AND SHOULDERS , SQUAMOS CELL ON HAND AND MELANOMA LEFT ARM...   Cervix cancer ([place]) 1987   microinvasive   Constipation    Edema of both lower extremities    Hypertension    Joint pain    Leiomyoma 2013   multiple small   Melanoma ([place])    Obesity    Prediabetes    Shoulder pain    Snoring     Past Surgical History:  Procedure Laterality Date   arm surgery     to remove melanoma on left upper outter arm   BUNIONECTOMY     LEFT FOOT WITH PINS   CERVICAL CONE BIOPSY  1987   CESAREAN SECTION     DILATION AND CURETTAGE OF UTERUS     x2   FOOT SURGERY     MELANOMA EXCISION     TONSILLECTOMY     as a child    Social History:  reports that she has never smoked. She has never used smokeless tobacco. She reports current alcohol use of about 4.0 standard drinks per week. She reports that she does not use drugs.  Allergies: No Known Allergies  Family History:  Family History   Problem Relation Age of Onset   Stroke Mother    Osteoporosis Mother    Hypertension Mother    Hyperlipidemia Father    Atrial fibrillation Father    Cancer Father        Prostate   Heart defect Father    Breast cancer Maternal Grandmother 91     Current Outpatient Medications:    amphetamine-dextroamphetamine (ADDERALL) 20 MG tablet, Take 1 tablet (20 mg total) by mouth 2 (two) times daily., Disp: 60 tablet, Rfl: 0   amphetamine-dextroamphetamine (ADDERALL) 20 MG tablet, Take 1 tablet (20 mg total) by mouth 2 (two) times daily., Disp: 60 tablet, Rfl: 0   amphetamine-dextroamphetamine (ADDERALL) 20 MG tablet, Take 1 tablet (20 mg total) by mouth 2 (two) times daily., Disp: 60 tablet, Rfl: 0   calcium carbonate (OS-CAL) 600 MG TABS tablet, Take 600 mg by mouth 2 (two) times daily with a meal., Disp: , Rfl:    Cholecalciferol (VITAMIN D3) 125 MCG (5000 UT) TABS, Take by mouth., Disp: , Rfl:    citalopram (CELEXA) 20 MG tablet, TAKE 1 TABLET(20 MG) BY MOUTH DAILY, Disp: 90 tablet, Rfl: 1   diclofenac (VOLTAREN) 50 MG EC tablet, Take 50 mg by mouth 2 (two) times daily as needed., Disp: ,  Rfl:    hydrochlorothiazide (MICROZIDE) 12.5 MG capsule, TAKE 1 CAPSULE(12.5 MG) BY MOUTH DAILY, Disp: 90 capsule, Rfl: 1   ibuprofen (ADVIL,MOTRIN) 200 MG tablet, Take 3-4 tablets (600-800 mg total) by mouth every 6 (six) hours as needed for fever, headache, mild pain, moderate pain or cramping., Disp: 30 tablet, Rfl: 6   metFORMIN (GLUCOPHAGE) 500 MG tablet, TAKE 1 TABLET(500 MG) BY MOUTH DAILY WITH LUNCH, Disp: 30 tablet, Rfl: 0   Omega-3 1000 MG CAPS, Take by mouth., Disp: , Rfl:    verapamil (CALAN-SR) 240 MG CR tablet, TAKE 1 TABLET(240 MG) BY MOUTH DAILY, Disp: 90 tablet, Rfl: 1   vitamin B-12 (CYANOCOBALAMIN) 500 MCG tablet, Take 600 mcg by mouth. + C, Disp: , Rfl:   Current Facility-Administered Medications:    0.9 %  sodium chloride infusion, , Intravenous, PRN, Jacquelin Hawking, NP  Review of  Systems:  Constitutional: Denies fever, chills, diaphoresis, appetite change and fatigue.  HEENT: Denies photophobia, eye pain, redness, hearing loss, ear pain, congestion, sore throat, rhinorrhea, sneezing, mouth sores, trouble swallowing, neck pain, neck stiffness and tinnitus.   Respiratory: Denies SOB, DOE, cough, chest tightness,  and wheezing.   Cardiovascular: Denies chest pain, palpitations and leg swelling.  Gastrointestinal: Denies nausea, vomiting, abdominal pain, diarrhea, constipation, blood in stool and abdominal distention.  Genitourinary: Denies dysuria, urgency, frequency, hematuria, flank pain and difficulty urinating.  Endocrine: Denies: hot or cold intolerance, sweats, changes in hair or nails, polyuria, polydipsia. Musculoskeletal: Denies myalgias, back pain, joint swelling, arthralgias and gait problem.  Skin: Denies pallor, rash and wound.  Neurological: Denies dizziness, seizures, syncope, weakness, light-headedness, numbness and headaches.  Hematological: Denies adenopathy. Easy bruising, personal or family bleeding history  Psychiatric/Behavioral: Denies suicidal ideation, mood changes, confusion, nervousness, sleep disturbance and agitation    Physical Exam: Vitals:   10/24/21 1020 10/24/21 1024  BP: 140/88 138/78  Pulse: 70   Temp: 98.5 F (36.9 C)   TempSrc: Oral   SpO2: 95%   Weight: 211 lb (95.7 kg)   Height: 5\' 9"  (1.753 m)     Body mass index is 31.16 kg/m.   Constitutional: NAD, calm, comfortable Eyes: PERRL, lids and conjunctivae normal ENMT: Mucous membranes are moist.  Respiratory: clear to auscultation bilaterally, no wheezing, no crackles. Normal respiratory effort. No accessory muscle use.  Cardiovascular: Regular rate and rhythm, no murmurs / rubs / gallops. No extremity edema.  Neurologic: Grossly intact and nonfocal Psychiatric: Normal judgment and insight. Alert and oriented x 3. Normal mood.    Impression and Plan:  Encounter  for preventive health examination -Recommend routine eye and dental care. -Immunizations: All immunizations are up-to-date -Healthy lifestyle discussed in detail. -Labs to be updated today. -Colon cancer screening: Summer 2021 -Breast cancer screening: 03/2021 -Cervical cancer screening: 01/2019 -Lung cancer screening: Not applicable -Prostate cancer screening: Not applicable -DEXA: Not applicable  Attention deficit hyperactivity disorder (ADHD), predominantly inattentive type -PDMP reviewed, no red flags, overdose risk score is 110. -Refill Adderall 20 mg to take 1 tablet twice daily for total of 60 tablets a month x3 months.  Primary hypertension -Blood pressure is noted to be elevated in office today. -She will do ambulatory blood pressure monitoring and contact me in 3 to 4 weeks if remains elevated.  Other hyperlipidemia -Recent lipid panel with a total cholesterol of 231, triglycerides 90 and LDL 134, she is not on medication.  Prediabetes -Recent A1c was 5.4, improved with weight loss  Depression, recurrent ([place]) [place]  Visit from 10/24/2021 in [place] at [facility]  PHQ-9 Total Score 2     -Mood is stable       Dinnis Rog Isaac Bliss, MD [facility]

## 2021-10-30 ENCOUNTER — Encounter (INDEPENDENT_AMBULATORY_CARE_PROVIDER_SITE_OTHER): Payer: Self-pay | Admitting: Family Medicine

## 2021-10-30 ENCOUNTER — Ambulatory Visit (INDEPENDENT_AMBULATORY_CARE_PROVIDER_SITE_OTHER): Payer: BC Managed Care – PPO | Admitting: Family Medicine

## 2021-10-30 ENCOUNTER — Other Ambulatory Visit: Payer: Self-pay

## 2021-10-30 VITALS — BP 164/75 | HR 74 | Temp 97.9°F | Ht 69.0 in | Wt 203.0 lb

## 2021-10-30 DIAGNOSIS — R7303 Prediabetes: Secondary | ICD-10-CM

## 2021-10-30 DIAGNOSIS — E7849 Other hyperlipidemia: Secondary | ICD-10-CM | POA: Diagnosis not present

## 2021-10-30 DIAGNOSIS — E559 Vitamin D deficiency, unspecified: Secondary | ICD-10-CM | POA: Diagnosis not present

## 2021-10-30 DIAGNOSIS — E66812 Obesity, class 2: Secondary | ICD-10-CM

## 2021-10-30 DIAGNOSIS — Z6837 Body mass index (BMI) 37.0-37.9, adult: Secondary | ICD-10-CM

## 2021-10-30 MED ORDER — VITAMIN D3 125 MCG (5000 UT) PO CAPS: ORAL_CAPSULE | ORAL | 0 refills | Status: DC

## 2021-10-30 NOTE — Progress Notes (Signed)
The 10-year ASCVD risk score (Arnett DK, et al., 2019) is: 6.9%   Values used to calculate the score:     Age: 60 years     Sex: Female     Is Non-Hispanic African American: No     Diabetic: No     Tobacco smoker: No     Systolic Blood Pressure: 588 mmHg     Is BP treated: Yes     HDL Cholesterol: 82 mg/dL     Total Cholesterol: 231 mg/dL

## 2021-10-30 NOTE — Progress Notes (Signed)
Chief Complaint:   OBESITY Dail is here to discuss her progress with her obesity treatment plan along with follow-up of her obesity related diagnoses. Kolby is on the Category 2 Plan and states she is following her eating plan approximately 85% of the time. Argie states she is walking for 4 hours 1 times per week.  Today's visit was #: 11 Starting weight: 249 lbs Starting date: 04/05/2021 Today's weight: 203 lbs Today's date: 10/30/2021 Total lbs lost to date: 46 lbs Total lbs lost since last in-office visit: 5 lbs  Interim History: Laresha is doing exceedingly well with weight, losing 46 lbs over the last 6 months. She is not much of a breakfast person. However she feels she gets in the missed protein later in the day. Her muscle mass has decreased about 10 lbs since starting our program.   Subjective:   1. Pre-diabetes Nikelle's A1C is down to 5.4 from 5.8 in May. She is on Metformin 500 daily. She takes Metformin sporadically.   Lab Results  Component Value Date   HGBA1C 5.4 10/02/2021   Lab Results  Component Value Date   INSULIN 9.5 04/05/2021    2. Other hyperlipidemia Donnetta's LDL continues to be elevated at 134. Her HDL and Triglycerides are within normal limits. 10 yr ASCVD risk score is 6.9%. She is taking Omega 3. She is not on a statin. She is unsure if she has a family history of CAD.   Lab Results  Component Value Date   CHOL 231 (H) 10/02/2021   HDL 82 10/02/2021   LDLCALC 134 (H) 10/02/2021   TRIG 90 10/02/2021   CHOLHDL 3.4 02/23/2020   Lab Results  Component Value Date   ALT 27 10/02/2021   AST 17 10/02/2021   ALKPHOS 60 10/02/2021   BILITOT 0.4 10/02/2021   The 10-year ASCVD risk score (Arnett DK, et al., 2019) is: 6.9%   Values used to calculate the score:     Age: 61 years     Sex: Female     Is Non-Hispanic African American: No     Diabetic: No     Tobacco smoker: No     Systolic Blood Pressure: 008 mmHg     Is BP treated: Yes     HDL  Cholesterol: 82 mg/dL     Total Cholesterol: 231 mg/dL  3. Vitamin D deficiency Ronella's Vitamin D is slightly low at 41. She on OTC Vitamin D 5000 daily.   Lab Results  Component Value Date   VD25OH 41.1 04/05/2021   VD25OH 41 01/21/2019   VD25OH 39 07/18/2017    Assessment/Plan:   1. Pre-diabetes Acquanetta will continue her medications. She will continue to work on weight loss, exercise, and decreasing simple carbohydrates to help decrease the risk of diabetes. We discussed labs today.  2. Other hyperlipidemia Cardiovascular risk and specific lipid/LDL goals reviewed.  We discussed several lifestyle modifications today and Azalya will continue to work on diet, exercise and weight loss efforts. Amiaya will continue Omega 3. She was referred to her primary care provider for discussion about statin. We discussed labs today.   3. Vitamin D deficiency Hiroko agrees to continue to take OTC Vitamin D 5,000 IU 1 capsule Monday thru Friday plus 10,000 IU and she will take 2 capsule on Saturday and Sunday and she will follow-up for routine testing of Vitamin D, at least 2-3 times per year to avoid over-replacement. We discussed labs today.  - Cholecalciferol (VITAMIN D3)  [place] (5000 UT) CAPS; Take 1 cap daily Monday through Friday and 2 caps daily Saturday and Sunday.  Dispense: 30 capsule; Refill: 0  4. Obesity: Current BMI 29.96 Lynora is currently in the action stage of change. As such, her goal is to continue with weight loss efforts. She has agreed to the Category 2 Plan.   We discussed breakfast options. We discussed the importance of resistance training.  Exercise goals:  Zilla will add resistance training 2-3 days per week.  Behavioral modification strategies: meal planning and cooking strategies.  Yomara has agreed to follow-up with our clinic in 3 weeks with Dr. Owens Shark.   Objective:   Blood pressure (!) 164/75, pulse 74, temperature 97.9 F (36.6 C), height 5\' 9"  (1.753 m), weight  203 lb (92.1 kg), last menstrual period 05/30/2012, SpO2 98 %. Body mass index is 29.98 kg/m.  General: Cooperative, alert, well developed, in no acute distress. HEENT: Conjunctivae and lids unremarkable. Cardiovascular: Regular rhythm.  Lungs: Normal work of breathing. Neurologic: No focal deficits.   Lab Results  Component Value Date   CREATININE 0.73 10/02/2021   BUN 22 10/02/2021   NA 138 10/02/2021   K 4.5 10/02/2021   CL 101 10/02/2021   CO2 27 10/02/2021   Lab Results  Component Value Date   ALT 27 10/02/2021   AST 17 10/02/2021   ALKPHOS 60 10/02/2021   BILITOT 0.4 10/02/2021   Lab Results  Component Value Date   HGBA1C 5.4 10/02/2021   HGBA1C 5.8 (H) 04/05/2021   Lab Results  Component Value Date   INSULIN 9.5 04/05/2021   Lab Results  Component Value Date   TSH 2.220 04/05/2021   Lab Results  Component Value Date   CHOL 231 (H) 10/02/2021   HDL 82 10/02/2021   LDLCALC 134 (H) 10/02/2021   TRIG 90 10/02/2021   CHOLHDL 3.4 02/23/2020   Lab Results  Component Value Date   VD25OH 41.1 04/05/2021   VD25OH 41 01/21/2019   VD25OH 39 07/18/2017   Lab Results  Component Value Date   WBC 5.4 02/23/2020   HGB 13.3 02/23/2020   HCT 40.3 02/23/2020   MCV 93.1 02/23/2020   PLT 280 02/23/2020   No results found for: IRON, TIBC, FERRITIN  Attestation Statements:   Reviewed by clinician on day of visit: allergies, medications, problem list, medical history, surgical history, family history, social history, and previous encounter notes.  I, Lizbeth Bark, RMA, am acting as Location manager for [employer], [place].  I have reviewed the above documentation for accuracy and completeness, and I agree with the above. -  Georgianne Fick, FNP

## 2021-11-02 DIAGNOSIS — E559 Vitamin D deficiency, unspecified: Secondary | ICD-10-CM | POA: Insufficient documentation

## 2021-11-09 ENCOUNTER — Other Ambulatory Visit: Payer: Self-pay | Admitting: Internal Medicine

## 2021-11-09 DIAGNOSIS — I10 Essential (primary) hypertension: Secondary | ICD-10-CM

## 2021-11-14 ENCOUNTER — Other Ambulatory Visit: Payer: Self-pay

## 2021-11-14 DIAGNOSIS — I10 Essential (primary) hypertension: Secondary | ICD-10-CM

## 2021-11-14 MED ORDER — VERAPAMIL HCL ER 240 MG PO TBCR: EXTENDED_RELEASE_TABLET | ORAL | 0 refills | Status: DC

## 2021-11-19 DIAGNOSIS — M25552 Pain in left hip: Secondary | ICD-10-CM | POA: Diagnosis not present

## 2021-11-20 DIAGNOSIS — M25552 Pain in left hip: Secondary | ICD-10-CM | POA: Diagnosis not present

## 2021-11-21 ENCOUNTER — Ambulatory Visit (INDEPENDENT_AMBULATORY_CARE_PROVIDER_SITE_OTHER): Payer: BC Managed Care – PPO | Admitting: Bariatrics

## 2021-11-28 ENCOUNTER — Encounter (INDEPENDENT_AMBULATORY_CARE_PROVIDER_SITE_OTHER): Payer: Self-pay | Admitting: Bariatrics

## 2021-11-28 ENCOUNTER — Ambulatory Visit (INDEPENDENT_AMBULATORY_CARE_PROVIDER_SITE_OTHER): Payer: BC Managed Care – PPO | Admitting: Bariatrics

## 2021-11-28 ENCOUNTER — Other Ambulatory Visit: Payer: Self-pay

## 2021-11-28 VITALS — BP 144/86 | HR 68 | Temp 98.4°F | Ht 69.0 in | Wt 206.0 lb

## 2021-11-28 DIAGNOSIS — Z6830 Body mass index (BMI) 30.0-30.9, adult: Secondary | ICD-10-CM

## 2021-11-28 DIAGNOSIS — R7303 Prediabetes: Secondary | ICD-10-CM

## 2021-11-28 DIAGNOSIS — E669 Obesity, unspecified: Secondary | ICD-10-CM

## 2021-11-28 DIAGNOSIS — E66812 Obesity, class 2: Secondary | ICD-10-CM

## 2021-11-28 DIAGNOSIS — I10 Essential (primary) hypertension: Secondary | ICD-10-CM | POA: Diagnosis not present

## 2021-11-28 NOTE — Progress Notes (Signed)
Chief Complaint:   OBESITY Misty Benson is here to discuss her progress with her obesity treatment plan along with follow-up of her obesity related diagnoses. Misty Benson is on the Category 2 Plan and states she is following her eating plan approximately 65-70% of the time. Misty Benson states she is walking for 20-30 minutes 2 times per week.  Today's visit was #: 12 Starting weight: 249 lbs Starting date: 04/05/2021 Today's weight: 206 lbs Today's date: 11/28/2021 Total lbs lost to date: 43 lbs Total lbs lost since last in-office visit: 0  Interim History: Misty Benson is up 3 lbs since her last visit (over the holidays). She is up 3 lbs of water per the bioimpedance scale.   Subjective:   1. Essential hypertension Misty Benson is taking HCTZ currently. Her blood pressure is controlled. Her blood pressure was 144/86 today.  2. Pre-diabetes Misty Benson is currently taking Glucoophage.  Assessment/Plan:   1. Essential hypertension Misty Benson will continue taking her medications. She will have no added salt. She is working on healthy weight loss and exercise to improve blood pressure control. We will watch for signs of hypotension as she continues her lifestyle modifications.  2. Pre-diabetes Misty Benson will continue Metformin and will begin taking it 2 times daily. She will decrease starches/sweets to 2 times per week. She will increase water with lemon and lime. She will drink cranberry juice. She will continue to work on weight loss, exercise, and decreasing simple carbohydrates to help decrease the risk of diabetes.   3. Obesity: Current BMI 30.5 Misty Benson is currently in the action stage of change. As such, her goal is to continue with weight loss efforts. She has agreed to the Category 2 Plan.   Misty Benson will adhere more closely to the plan 80-90%. She will continue meal planning and she will increase her water intake.  Exercise goals:  Misty Benson will decrease activity due to hip pain.  Behavioral modification strategies:  increasing lean protein intake, decreasing simple carbohydrates, increasing vegetables, increasing water intake, decreasing eating out, no skipping meals, meal planning and cooking strategies, keeping healthy foods in the home, and planning for success.  Misty Benson has agreed to follow-up with our clinic in 3 weeks. She was informed of the importance of frequent follow-up visits to maximize her success with intensive lifestyle modifications for her multiple health conditions.   Objective:   Blood pressure (!) 144/86, pulse 68, temperature 98.4 F (36.9 C), height 5\' 9"  (1.753 m), weight 206 lb (93.4 kg), last menstrual period 05/30/2012, SpO2 99 %. Body mass index is 30.42 kg/m.  General: Cooperative, alert, well developed, in no acute distress. HEENT: Conjunctivae and lids unremarkable. Cardiovascular: Regular rhythm.  Lungs: Normal work of breathing. Neurologic: No focal deficits.   Lab Results  Component Value Date   CREATININE 0.73 10/02/2021   BUN 22 10/02/2021   NA 138 10/02/2021   K 4.5 10/02/2021   CL 101 10/02/2021   CO2 27 10/02/2021   Lab Results  Component Value Date   ALT 27 10/02/2021   AST 17 10/02/2021   ALKPHOS 60 10/02/2021   BILITOT 0.4 10/02/2021   Lab Results  Component Value Date   HGBA1C 5.4 10/02/2021   HGBA1C 5.8 (H) 04/05/2021   Lab Results  Component Value Date   INSULIN 9.5 04/05/2021   Lab Results  Component Value Date   TSH 2.220 04/05/2021   Lab Results  Component Value Date   CHOL 231 (H) 10/02/2021   HDL 82 10/02/2021   LDLCALC 134 (  H) 10/02/2021   TRIG 90 10/02/2021   CHOLHDL 3.4 02/23/2020   Lab Results  Component Value Date   VD25OH 41.1 04/05/2021   VD25OH 41 01/21/2019   VD25OH 39 07/18/2017   Lab Results  Component Value Date   WBC 5.4 02/23/2020   HGB 13.3 02/23/2020   HCT 40.3 02/23/2020   MCV 93.1 02/23/2020   PLT 280 02/23/2020   No results found for: IRON, TIBC, FERRITIN  Attestation Statements:   Reviewed  by clinician on day of visit: allergies, medications, problem list, medical history, surgical history, family history, social history, and previous encounter notes.  I, Lizbeth Bark, RMA, am acting as Location manager for [employer], [place].  I have reviewed the above documentation for accuracy and completeness, and I agree with the above. Jearld Lesch, DO

## 2021-12-03 ENCOUNTER — Encounter (INDEPENDENT_AMBULATORY_CARE_PROVIDER_SITE_OTHER): Payer: Self-pay | Admitting: Bariatrics

## 2021-12-04 DIAGNOSIS — M5440 Lumbago with sciatica, unspecified side: Secondary | ICD-10-CM | POA: Diagnosis not present

## 2021-12-04 DIAGNOSIS — M9902 Segmental and somatic dysfunction of thoracic region: Secondary | ICD-10-CM | POA: Diagnosis not present

## 2021-12-04 DIAGNOSIS — M62830 Muscle spasm of back: Secondary | ICD-10-CM | POA: Diagnosis not present

## 2021-12-04 DIAGNOSIS — M9903 Segmental and somatic dysfunction of lumbar region: Secondary | ICD-10-CM | POA: Diagnosis not present

## 2021-12-05 DIAGNOSIS — M9903 Segmental and somatic dysfunction of lumbar region: Secondary | ICD-10-CM | POA: Diagnosis not present

## 2021-12-05 DIAGNOSIS — M25552 Pain in left hip: Secondary | ICD-10-CM | POA: Diagnosis not present

## 2021-12-05 DIAGNOSIS — M5440 Lumbago with sciatica, unspecified side: Secondary | ICD-10-CM | POA: Diagnosis not present

## 2021-12-05 DIAGNOSIS — M62830 Muscle spasm of back: Secondary | ICD-10-CM | POA: Diagnosis not present

## 2021-12-05 DIAGNOSIS — M9902 Segmental and somatic dysfunction of thoracic region: Secondary | ICD-10-CM | POA: Diagnosis not present

## 2021-12-06 ENCOUNTER — Telehealth: Payer: Self-pay | Admitting: Internal Medicine

## 2021-12-06 DIAGNOSIS — I788 Other diseases of capillaries: Secondary | ICD-10-CM | POA: Diagnosis not present

## 2021-12-06 DIAGNOSIS — Z85820 Personal history of malignant melanoma of skin: Secondary | ICD-10-CM | POA: Diagnosis not present

## 2021-12-06 DIAGNOSIS — L578 Other skin changes due to chronic exposure to nonionizing radiation: Secondary | ICD-10-CM | POA: Diagnosis not present

## 2021-12-06 DIAGNOSIS — Z85828 Personal history of other malignant neoplasm of skin: Secondary | ICD-10-CM | POA: Diagnosis not present

## 2021-12-06 NOTE — Telephone Encounter (Signed)
Pt is calling and need a refill on amphetamine-dextroamphetamine (ADDERALL) 20 MG tablet sent to  [facility], [address] AT [address] Phone:  [phone]  Fax:  [phone]

## 2021-12-07 NOTE — Telephone Encounter (Signed)
Last refill per controlled substance database: 10/10/21 Last OV: 11/28/21 Next OV: none scheduled

## 2021-12-10 NOTE — Telephone Encounter (Signed)
Left a message for the pt to return my call.  

## 2021-12-10 NOTE — Telephone Encounter (Signed)
Pt informed of message above.

## 2021-12-13 ENCOUNTER — Other Ambulatory Visit (HOSPITAL_COMMUNITY): Payer: Self-pay

## 2021-12-13 ENCOUNTER — Other Ambulatory Visit: Payer: Self-pay

## 2021-12-13 DIAGNOSIS — F900 Attention-deficit hyperactivity disorder, predominantly inattentive type: Secondary | ICD-10-CM

## 2021-12-13 MED ORDER — AMPHETAMINE-DEXTROAMPHETAMINE 20 MG PO TABS
20.0000 mg | ORAL_TABLET | Freq: Two times a day (BID) | ORAL | 0 refills | Status: DC
  Filled 2021-12-13: qty 60, 30d supply, fill #0

## 2021-12-13 NOTE — Telephone Encounter (Signed)
Pt called into the office and stated that her pharmacy is currently out of stock of Adderall 20 20 mg. Pt requested refill be sent to [facility] outpatient as they currently have stock.

## 2021-12-18 ENCOUNTER — Other Ambulatory Visit: Payer: Self-pay

## 2021-12-18 ENCOUNTER — Ambulatory Visit (INDEPENDENT_AMBULATORY_CARE_PROVIDER_SITE_OTHER): Payer: BC Managed Care – PPO | Admitting: Bariatrics

## 2021-12-18 ENCOUNTER — Encounter (INDEPENDENT_AMBULATORY_CARE_PROVIDER_SITE_OTHER): Payer: Self-pay | Admitting: Bariatrics

## 2021-12-18 VITALS — BP 121/74 | HR 79 | Temp 98.1°F | Ht 69.0 in | Wt 205.0 lb

## 2021-12-18 DIAGNOSIS — Z6837 Body mass index (BMI) 37.0-37.9, adult: Secondary | ICD-10-CM

## 2021-12-18 DIAGNOSIS — E669 Obesity, unspecified: Secondary | ICD-10-CM | POA: Diagnosis not present

## 2021-12-18 DIAGNOSIS — Z6830 Body mass index (BMI) 30.0-30.9, adult: Secondary | ICD-10-CM | POA: Diagnosis not present

## 2021-12-18 DIAGNOSIS — E7849 Other hyperlipidemia: Secondary | ICD-10-CM | POA: Diagnosis not present

## 2021-12-18 DIAGNOSIS — I10 Essential (primary) hypertension: Secondary | ICD-10-CM | POA: Diagnosis not present

## 2021-12-18 NOTE — Progress Notes (Signed)
Chief Complaint:   OBESITY Misty Benson is here to discuss her progress with her obesity treatment plan along with follow-up of her obesity related diagnoses. Misty Benson is on the Category 2 Plan and states she is following her eating plan approximately 70% of the time. Misty Benson states she is doing 0 minutes 0 times per week.  Today's visit was #: 2 Starting weight: 249 lbs Starting date: 04/05/2021 Today's weight: 205 lbs Today's date: 12/18/2021 Total lbs lost to date: 44 lbs Total lbs lost since last in-office visit: 1 lb  Interim History: Misty Benson is down 1 lb since her last visit. She took Prednisone and muscle relaxer for her hip pain. She ate more carbohydrates.   Subjective:   1. Primary hypertension Misty Benson is taking Calan SR and HCTZ currently.  2. Other hyperlipidemia Misty Benson is currently taking Omega 3.   Assessment/Plan:   1. Primary hypertension Misty Benson will continue taking he medications. She is working on healthy weight loss and exercise to improve blood pressure control. We will watch for signs of hypotension as she continues her lifestyle modifications.  2. Other hyperlipidemia Cardiovascular risk and specific lipid/LDL goals reviewed.  Misty Benson will continue taking Omega 3. She will continue plan and she will increase activities. We discussed several lifestyle modifications today. Orders and follow up as documented in patient record.   Counseling Intensive lifestyle modifications are the first line treatment for this issue. Dietary changes: Increase soluble fiber. Decrease simple carbohydrates. Exercise changes: Moderate to vigorous-intensity aerobic activity 150 minutes per week if tolerated. Lipid-lowering medications: see documented in medical record.  3. Obesity: Current BMI 30.4 Misty Benson is currently in the action stage of change. As such, her goal is to continue with weight loss efforts. She has agreed to the Category 2 Plan.   Misty Benson will adhere closely to the plan 80-90%.  She will be mindful eating. She will follow up orthopedist soon. She will weight her meats and she will increase protein.  Exercise goals:  Misty Benson has left hip pain after the fall.  Behavioral modification strategies: increasing lean protein intake, decreasing simple carbohydrates, increasing vegetables, increasing water intake, decreasing eating out, no skipping meals, meal planning and cooking strategies, keeping healthy foods in the home, and planning for success.  Misty Benson has agreed to follow-up with our clinic in 3-4 weeks. She was informed of the importance of frequent follow-up visits to maximize her success with intensive lifestyle modifications for her multiple health conditions.   Objective:   Blood pressure 121/74, pulse 79, temperature 98.1 F (36.7 C), height 5\' 9"  (1.753 m), weight 205 lb (93 kg), last menstrual period 05/30/2012, SpO2 98 %. Body mass index is 30.27 kg/m.  General: Cooperative, alert, well developed, in no acute distress. HEENT: Conjunctivae and lids unremarkable. Cardiovascular: Regular rhythm.  Lungs: Normal work of breathing. Neurologic: No focal deficits.   Lab Results  Component Value Date   CREATININE 0.73 10/02/2021   BUN 22 10/02/2021   NA 138 10/02/2021   K 4.5 10/02/2021   CL 101 10/02/2021   CO2 27 10/02/2021   Lab Results  Component Value Date   ALT 27 10/02/2021   AST 17 10/02/2021   ALKPHOS 60 10/02/2021   BILITOT 0.4 10/02/2021   Lab Results  Component Value Date   HGBA1C 5.4 10/02/2021   HGBA1C 5.8 (H) 04/05/2021   Lab Results  Component Value Date   INSULIN 9.5 04/05/2021   Lab Results  Component Value Date   TSH 2.220 04/05/2021  Lab Results  Component Value Date   CHOL 231 (H) 10/02/2021   HDL 82 10/02/2021   LDLCALC 134 (H) 10/02/2021   TRIG 90 10/02/2021   CHOLHDL 3.4 02/23/2020   Lab Results  Component Value Date   VD25OH 41.1 04/05/2021   VD25OH 41 01/21/2019   VD25OH 39 07/18/2017   Lab Results   Component Value Date   WBC 5.4 02/23/2020   HGB 13.3 02/23/2020   HCT 40.3 02/23/2020   MCV 93.1 02/23/2020   PLT 280 02/23/2020   No results found for: IRON, TIBC, FERRITIN  Attestation Statements:   Reviewed by clinician on day of visit: allergies, medications, problem list, medical history, surgical history, family history, social history, and previous encounter notes.  I, Lizbeth Bark, RMA, am acting as Location manager for [employer], [place].  I have reviewed the above documentation for accuracy and completeness, and I agree with the above. Jearld Lesch, DO

## 2021-12-19 ENCOUNTER — Encounter (INDEPENDENT_AMBULATORY_CARE_PROVIDER_SITE_OTHER): Payer: Self-pay | Admitting: Bariatrics

## 2021-12-20 DIAGNOSIS — M1612 Unilateral primary osteoarthritis, left hip: Secondary | ICD-10-CM | POA: Diagnosis not present

## 2022-01-08 ENCOUNTER — Ambulatory Visit (INDEPENDENT_AMBULATORY_CARE_PROVIDER_SITE_OTHER): Payer: BC Managed Care – PPO | Admitting: Bariatrics

## 2022-01-16 ENCOUNTER — Ambulatory Visit (INDEPENDENT_AMBULATORY_CARE_PROVIDER_SITE_OTHER): Payer: BC Managed Care – PPO | Admitting: Family Medicine

## 2022-01-18 ENCOUNTER — Other Ambulatory Visit: Payer: Self-pay | Admitting: Internal Medicine

## 2022-01-18 DIAGNOSIS — I10 Essential (primary) hypertension: Secondary | ICD-10-CM

## 2022-01-18 DIAGNOSIS — F900 Attention-deficit hyperactivity disorder, predominantly inattentive type: Secondary | ICD-10-CM

## 2022-01-28 ENCOUNTER — Encounter (INDEPENDENT_AMBULATORY_CARE_PROVIDER_SITE_OTHER): Payer: Self-pay | Admitting: Family Medicine

## 2022-01-28 ENCOUNTER — Other Ambulatory Visit: Payer: Self-pay

## 2022-01-28 ENCOUNTER — Ambulatory Visit (INDEPENDENT_AMBULATORY_CARE_PROVIDER_SITE_OTHER): Payer: BC Managed Care – PPO | Admitting: Family Medicine

## 2022-01-28 VITALS — BP 139/60 | HR 83 | Temp 98.6°F | Ht 69.0 in | Wt 203.0 lb

## 2022-01-28 DIAGNOSIS — E669 Obesity, unspecified: Secondary | ICD-10-CM

## 2022-01-28 DIAGNOSIS — Z6830 Body mass index (BMI) 30.0-30.9, adult: Secondary | ICD-10-CM | POA: Diagnosis not present

## 2022-01-28 DIAGNOSIS — R7303 Prediabetes: Secondary | ICD-10-CM | POA: Diagnosis not present

## 2022-01-31 NOTE — Progress Notes (Signed)
? ? ? ?Chief Complaint:  ? ?OBESITY ?Misty Benson is here to discuss her progress with her obesity treatment plan along with follow-up of her obesity related diagnoses. Misty Benson is on the Category 2 Plan and states she is following her eating plan approximately 70% of the time. Misty Benson states she is doing 0 minutes 0 times per week. ? ?Today's visit was #: 14 ?Starting weight: 249 lbs ?Starting date: 5/26/022 ?Today's weight: 203 lbs ?Today's date: 01/28/2022 ?Total lbs lost to date: 51 ?Total lbs lost since last in-office visit: 2 ? ?Interim History: Misty Benson continues to lose weight on her plan. She is doing well with meeting her protein goals. She has done well on her plan overall, but she is limited with exercise due to hip pain. ? ?Subjective:  ? ?1. Pre-diabetes ?Misty Benson is forgetting to take her metformin regularly. Her A1c has improved with her diet and weight loss, but she could still use the benefit for polyphagia. ? ?Assessment/Plan:  ? ?1. Pre-diabetes ?Misty Benson will continue metformin. We discussed ways to help her remember to take it regularly.  ? ?2. Obesity: Current BMI 30.0 ?Misty Benson is currently in the action stage of change. As such, her goal is to continue with weight loss efforts. She has agreed to the Category 2 Plan.  ? ?Behavioral modification strategies: increasing lean protein intake, no skipping meals, and meal planning and cooking strategies. ? ?Misty Benson has agreed to follow-up with our clinic in 3 weeks with Abby Potash, PA-C. She was informed of the importance of frequent follow-up visits to maximize her success with intensive lifestyle modifications for her multiple health conditions.  ? ?Objective:  ? ?Blood pressure 139/60, pulse 83, temperature 98.6 ?F (37 ?C), height '5\' 9"'$  (1.753 m), weight 203 lb (92.1 kg), last menstrual period 05/30/2012, SpO2 96 %. ?Body mass index is 29.98 kg/m?. ? ?General: Cooperative, alert, well developed, in no acute distress. ?HEENT: Conjunctivae and lids  unremarkable. ?Cardiovascular: Regular rhythm.  ?Lungs: Normal work of breathing. ?Neurologic: No focal deficits.  ? ?Lab Results  ?Component Value Date  ? CREATININE 0.73 10/02/2021  ? BUN 22 10/02/2021  ? NA 138 10/02/2021  ? K 4.5 10/02/2021  ? CL 101 10/02/2021  ? CO2 27 10/02/2021  ? ?Lab Results  ?Component Value Date  ? ALT 27 10/02/2021  ? AST 17 10/02/2021  ? ALKPHOS 60 10/02/2021  ? BILITOT 0.4 10/02/2021  ? ?Lab Results  ?Component Value Date  ? HGBA1C 5.4 10/02/2021  ? HGBA1C 5.8 (H) 04/05/2021  ? ?Lab Results  ?Component Value Date  ? INSULIN 9.5 04/05/2021  ? ?Lab Results  ?Component Value Date  ? TSH 2.220 04/05/2021  ? ?Lab Results  ?Component Value Date  ? CHOL 231 (H) 10/02/2021  ? HDL 82 10/02/2021  ? LDLCALC 134 (H) 10/02/2021  ? TRIG 90 10/02/2021  ? CHOLHDL 3.4 02/23/2020  ? ?Lab Results  ?Component Value Date  ? VD25OH 41.1 04/05/2021  ? VD25OH 41 01/21/2019  ? VD25OH 39 07/18/2017  ? ?Lab Results  ?Component Value Date  ? WBC 5.4 02/23/2020  ? HGB 13.3 02/23/2020  ? HCT 40.3 02/23/2020  ? MCV 93.1 02/23/2020  ? PLT 280 02/23/2020  ? ?No results found for: IRON, TIBC, FERRITIN ? ?Attestation Statements:  ? ?Reviewed by clinician on day of visit: allergies, medications, problem list, medical history, surgical history, family history, social history, and previous encounter notes. ? ?Time spent on visit including pre-visit chart review and post-visit care and charting was 30 minutes.  ? ? ?  Wilhemena Durie, am acting as transcriptionist for Dennard Nip, MD. ? ?I have reviewed the above documentation for accuracy and completeness, and I agree with the above. -  Dennard Nip, MD ? ? ?

## 2022-02-19 ENCOUNTER — Other Ambulatory Visit (INDEPENDENT_AMBULATORY_CARE_PROVIDER_SITE_OTHER): Payer: Self-pay | Admitting: Physician Assistant

## 2022-02-19 ENCOUNTER — Encounter (INDEPENDENT_AMBULATORY_CARE_PROVIDER_SITE_OTHER): Payer: Self-pay | Admitting: Physician Assistant

## 2022-02-19 ENCOUNTER — Ambulatory Visit (INDEPENDENT_AMBULATORY_CARE_PROVIDER_SITE_OTHER): Payer: BC Managed Care – PPO | Admitting: Physician Assistant

## 2022-02-19 VITALS — BP 127/74 | HR 64 | Temp 98.3°F | Ht 66.0 in | Wt 201.0 lb

## 2022-02-19 DIAGNOSIS — R7303 Prediabetes: Secondary | ICD-10-CM

## 2022-02-19 DIAGNOSIS — Z6830 Body mass index (BMI) 30.0-30.9, adult: Secondary | ICD-10-CM | POA: Diagnosis not present

## 2022-02-19 DIAGNOSIS — E669 Obesity, unspecified: Secondary | ICD-10-CM

## 2022-02-19 DIAGNOSIS — E66812 Obesity, class 2: Secondary | ICD-10-CM

## 2022-02-19 MED ORDER — METFORMIN HCL 500 MG PO TABS: ORAL_TABLET | ORAL | 0 refills | Status: DC

## 2022-02-25 NOTE — Progress Notes (Signed)
? ? ? ?Chief Complaint:  ? ?OBESITY ?Misty Benson is here to discuss her progress with her obesity treatment plan along with follow-up of her obesity related diagnoses. Misty Benson is on the Category 2 Plan and states she is following her eating plan approximately 80% of the time. Misty Benson states she is walking (hip issues) for 15 minutes 2 times per week. ? ?Today's visit was #: 15 ?Starting weight: 249 lbs ?Starting date: 04/05/2021 ?Today's weight: 201 lbs ?Today's date: 02/19/2022 ?Total lbs lost to date: 48 lbs ?Total lbs lost since last in-office visit: 2 lbs ? ?Interim History: Misty Benson continues to do well with weight loss. Sometimes she gets hungry at night after dinner. She is not weighing her protein at night. She is going to have a total hip replacement in the next few months.  ? ?Subjective:  ? ?1. Prediabetes ?Misty Benson is taking Metformin inconsistently. She reports hunger, especially after dinner. Her last A1C was 5.4. ? ?Assessment/Plan:  ? ?1. Prediabetes ?We will refill Metformin 500 mg for 1 month with no refills. Misty Benson will continue to work on weight loss, exercise, and decreasing simple carbohydrates to help decrease the risk of diabetes.  ? ?- metFORMIN (GLUCOPHAGE) 500 MG tablet; TAKE 1 TABLET(500 MG) BY MOUTH DAILY WITH LUNCH  Dispense: 30 tablet; Refill: 0 ? ?2. Obesity: Current BMI 30.0 ?Misty Benson is currently in the action stage of change. As such, her goal is to continue with weight loss efforts. She has agreed to the Category 2 Plan.  ? ?Exercise goals:  As is.  ? ?Behavioral modification strategies: meal planning and cooking strategies and keeping healthy foods in the home. ? ?Misty Benson has agreed to follow-up with our clinic in 2 weeks. She was informed of the importance of frequent follow-up visits to maximize her success with intensive lifestyle modifications for her multiple health conditions.  ? ?Objective:  ? ?Blood pressure 127/74, pulse 64, temperature 98.3 ?F (36.8 ?C), height '5\' 6"'$  (1.676 m), weight 201 lb  (91.2 kg), last menstrual period 05/30/2012, SpO2 99 %. ?Body mass index is 32.44 kg/m?. ? ?General: Cooperative, alert, well developed, in no acute distress. ?HEENT: Conjunctivae and lids unremarkable. ?Cardiovascular: Regular rhythm.  ?Lungs: Normal work of breathing. ?Neurologic: No focal deficits.  ? ?Lab Results  ?Component Value Date  ? CREATININE 0.73 10/02/2021  ? BUN 22 10/02/2021  ? NA 138 10/02/2021  ? K 4.5 10/02/2021  ? CL 101 10/02/2021  ? CO2 27 10/02/2021  ? ?Lab Results  ?Component Value Date  ? ALT 27 10/02/2021  ? AST 17 10/02/2021  ? ALKPHOS 60 10/02/2021  ? BILITOT 0.4 10/02/2021  ? ?Lab Results  ?Component Value Date  ? HGBA1C 5.4 10/02/2021  ? HGBA1C 5.8 (H) 04/05/2021  ? ?Lab Results  ?Component Value Date  ? INSULIN 9.5 04/05/2021  ? ?Lab Results  ?Component Value Date  ? TSH 2.220 04/05/2021  ? ?Lab Results  ?Component Value Date  ? CHOL 231 (H) 10/02/2021  ? HDL 82 10/02/2021  ? LDLCALC 134 (H) 10/02/2021  ? TRIG 90 10/02/2021  ? CHOLHDL 3.4 02/23/2020  ? ?Lab Results  ?Component Value Date  ? VD25OH 41.1 04/05/2021  ? VD25OH 41 01/21/2019  ? VD25OH 39 07/18/2017  ? ?Lab Results  ?Component Value Date  ? WBC 5.4 02/23/2020  ? HGB 13.3 02/23/2020  ? HCT 40.3 02/23/2020  ? MCV 93.1 02/23/2020  ? PLT 280 02/23/2020  ? ?No results found for: IRON, TIBC, FERRITIN ? ?Attestation Statements:  ? ?Reviewed  by clinician on day of visit: allergies, medications, problem list, medical history, surgical history, family history, social history, and previous encounter notes. ? ?Time spent on visit including pre-visit chart review and post-visit care and charting was 32 minutes.  ? ?I, Tonye Pearson, am acting as Location manager for [employer], PA-C. ? ?I have reviewed the above documentation for accuracy and completeness, and I agree with the above. Abby Potash, PA-C ? ?

## 2022-02-26 DIAGNOSIS — M25552 Pain in left hip: Secondary | ICD-10-CM | POA: Diagnosis not present

## 2022-02-26 DIAGNOSIS — M1612 Unilateral primary osteoarthritis, left hip: Secondary | ICD-10-CM | POA: Diagnosis not present

## 2022-02-28 DIAGNOSIS — Z85820 Personal history of malignant melanoma of skin: Secondary | ICD-10-CM | POA: Diagnosis not present

## 2022-02-28 DIAGNOSIS — L738 Other specified follicular disorders: Secondary | ICD-10-CM | POA: Diagnosis not present

## 2022-02-28 DIAGNOSIS — D225 Melanocytic nevi of trunk: Secondary | ICD-10-CM | POA: Diagnosis not present

## 2022-02-28 DIAGNOSIS — D485 Neoplasm of uncertain behavior of skin: Secondary | ICD-10-CM | POA: Diagnosis not present

## 2022-02-28 DIAGNOSIS — D2262 Melanocytic nevi of left upper limb, including shoulder: Secondary | ICD-10-CM | POA: Diagnosis not present

## 2022-02-28 DIAGNOSIS — L570 Actinic keratosis: Secondary | ICD-10-CM | POA: Diagnosis not present

## 2022-02-28 DIAGNOSIS — Z85828 Personal history of other malignant neoplasm of skin: Secondary | ICD-10-CM | POA: Diagnosis not present

## 2022-03-06 ENCOUNTER — Ambulatory Visit (INDEPENDENT_AMBULATORY_CARE_PROVIDER_SITE_OTHER): Payer: BC Managed Care – PPO | Admitting: Family Medicine

## 2022-03-06 ENCOUNTER — Encounter (INDEPENDENT_AMBULATORY_CARE_PROVIDER_SITE_OTHER): Payer: Self-pay | Admitting: Family Medicine

## 2022-03-06 VITALS — BP 130/58 | HR 68 | Temp 98.0°F | Ht 68.0 in | Wt 196.0 lb

## 2022-03-06 DIAGNOSIS — R7303 Prediabetes: Secondary | ICD-10-CM | POA: Diagnosis not present

## 2022-03-06 DIAGNOSIS — Z6829 Body mass index (BMI) 29.0-29.9, adult: Secondary | ICD-10-CM | POA: Diagnosis not present

## 2022-03-06 DIAGNOSIS — E669 Obesity, unspecified: Secondary | ICD-10-CM | POA: Diagnosis not present

## 2022-03-07 ENCOUNTER — Other Ambulatory Visit: Payer: Self-pay | Admitting: Obstetrics & Gynecology

## 2022-03-07 DIAGNOSIS — Z1231 Encounter for screening mammogram for malignant neoplasm of breast: Secondary | ICD-10-CM

## 2022-03-11 ENCOUNTER — Ambulatory Visit (INDEPENDENT_AMBULATORY_CARE_PROVIDER_SITE_OTHER): Payer: BC Managed Care – PPO | Admitting: Internal Medicine

## 2022-03-11 DIAGNOSIS — I10 Essential (primary) hypertension: Secondary | ICD-10-CM

## 2022-03-11 DIAGNOSIS — F900 Attention-deficit hyperactivity disorder, predominantly inattentive type: Secondary | ICD-10-CM

## 2022-03-11 MED ORDER — METFORMIN HCL 500 MG PO TABS: ORAL_TABLET | ORAL | 0 refills | Status: DC

## 2022-03-11 MED ORDER — CITALOPRAM HYDROBROMIDE 20 MG PO TABS: ORAL_TABLET | ORAL | 0 refills | Status: DC

## 2022-03-11 MED ORDER — HYDROCHLOROTHIAZIDE 12.5 MG PO CAPS: ORAL_CAPSULE | ORAL | 0 refills | Status: DC

## 2022-03-11 MED ORDER — AMPHETAMINE-DEXTROAMPHETAMINE 20 MG PO TABS: 20.0000 mg | ORAL_TABLET | Freq: Every day | ORAL | 0 refills | Status: DC

## 2022-03-11 MED ORDER — VERAPAMIL HCL ER 240 MG PO TBCR: EXTENDED_RELEASE_TABLET | ORAL | 1 refills | Status: DC

## 2022-03-11 MED ORDER — AMPHETAMINE-DEXTROAMPHETAMINE 20 MG PO TABS: 20.0000 mg | ORAL_TABLET | Freq: Two times a day (BID) | ORAL | 0 refills | Status: DC

## 2022-03-11 NOTE — Progress Notes (Signed)
? ? ? ?Established Patient Office Visit ? ? ? ? ?CC/Reason for Visit: Medication refills ? ?HPI: Misty Benson is a 61 y.o. female who is coming in today for the above mentioned reasons. Past Medical History is significant for: ADHD, obesity, hypertension, depression, hyperlipidemia.  She has lost around 60 pounds.  She is due for Adderall refills per protocol.  She is having her left hip replaced in July. ? ? ?Past Medical/Surgical History: ?Past Medical History:  ?Diagnosis Date  ? ADHD (attention deficit hyperactivity disorder)   ? Anxiety   ? Back pain   ? Cancer [facility])   ? BASAL CELL FACE AND SHOULDERS , SQUAMOS CELL ON HAND AND MELANOMA LEFT ARM.[facility].  ? Cervix cancer ([place]) 1987  ? microinvasive  ? Constipation   ? Edema of both lower extremities   ? Hypertension   ? Joint pain   ? Leiomyoma 2013  ? multiple small  ? Melanoma ([place])   ? Obesity   ? Prediabetes   ? Shoulder pain   ? Snoring   ? ? ?Past Surgical History:  ?Procedure Laterality Date  ? arm surgery    ? to remove melanoma on left upper outter arm  ? BUNIONECTOMY    ? LEFT FOOT WITH PINS  ? CERVICAL CONE BIOPSY  1987  ? CESAREAN SECTION    ? DILATION AND CURETTAGE OF UTERUS    ? x2  ? FOOT SURGERY    ? MELANOMA EXCISION    ? TONSILLECTOMY    ? as a child  ? ? ?Social History: ? reports that she has never smoked. She has never used smokeless tobacco. She reports current alcohol use of about 4.0 standard drinks per week. She reports that she does not use drugs. ? ?Allergies: ?No Known Allergies ? ?Family History:  ?Family History  ?Problem Relation Age of Onset  ? Stroke Mother   ? Osteoporosis Mother   ? Hypertension Mother   ? Hyperlipidemia Father   ? Atrial fibrillation Father   ? Cancer Father   ?     Prostate  ? Heart defect Father   ? Breast cancer Maternal Grandmother 75  ? ? ? ?Current Outpatient Medications:  ?  amphetamine-dextroamphetamine (ADDERALL) 20 MG tablet, Take 1 tablet (20 mg total) by mouth daily., Disp: 60 tablet, Rfl: 0 ?   amphetamine-dextroamphetamine (ADDERALL) 20 MG tablet, Take 1 tablet (20 mg total) by mouth 2 (two) times daily., Disp: 60 tablet, Rfl: 0 ?  amphetamine-dextroamphetamine (ADDERALL) 20 MG tablet, Take 1 tablet (20 mg total) by mouth daily., Disp: 60 tablet, Rfl: 0 ?  calcium carbonate (OS-CAL) 600 MG TABS tablet, Take 600 mg by mouth 2 (two) times daily with a meal., Disp: , Rfl:  ?  Cholecalciferol (VITAMIN D3) 125 MCG (5000 UT) CAPS, Take 1 cap daily Monday through Friday and 2 caps daily Saturday and Sunday., Disp: 30 capsule, Rfl: 0 ?  diclofenac (VOLTAREN) 50 MG EC tablet, Take 50 mg by mouth 2 (two) times daily as needed., Disp: , Rfl:  ?  ibuprofen (ADVIL,MOTRIN) 200 MG tablet, Take 3-4 tablets (600-800 mg total) by mouth every 6 (six) hours as needed for fever, headache, mild pain, moderate pain or cramping., Disp: 30 tablet, Rfl: 6 ?  metFORMIN (GLUCOPHAGE) 500 MG tablet, TAKE 1 TABLET(500 MG) BY MOUTH DAILY WITH LUNCH, Disp: 30 tablet, Rfl: 0 ?  Omega-3 1000 MG CAPS, Take by mouth., Disp: , Rfl:  ?  citalopram (CELEXA) 20 MG tablet,  TAKE 1 TABLET(20 MG) BY MOUTH DAILY, Disp: 90 tablet, Rfl: 0 ?  hydrochlorothiazide (MICROZIDE) 12.5 MG capsule, TAKE 1 CAPSULE(12.5 MG) BY MOUTH DAILY, Disp: 90 capsule, Rfl: 0 ?  verapamil (CALAN-SR) 240 MG CR tablet, TAKE 1 TABLET(240 MG) BY MOUTH DAILY, Disp: 90 tablet, Rfl: 1 ? ?Review of Systems:  ?Constitutional: Denies fever, chills, diaphoresis, appetite change and fatigue.  ?HEENT: Denies photophobia, eye pain, redness, hearing loss, ear pain, congestion, sore throat, rhinorrhea, sneezing, mouth sores, trouble swallowing, neck pain, neck stiffness and tinnitus.   ?Respiratory: Denies SOB, DOE, cough, chest tightness,  and wheezing.   ?Cardiovascular: Denies chest pain, palpitations and leg swelling.  ?Gastrointestinal: Denies nausea, vomiting, abdominal pain, diarrhea, constipation, blood in stool and abdominal distention.  ?Genitourinary: Denies dysuria, urgency,  frequency, hematuria, flank pain and difficulty urinating.  ?Endocrine: Denies: hot or cold intolerance, sweats, changes in hair or nails, polyuria, polydipsia. ?Musculoskeletal: Denies myalgias, back pain, joint swelling, arthralgias and gait problem.  ?Skin: Denies pallor, rash and wound.  ?Neurological: Denies dizziness, seizures, syncope, weakness, light-headedness, numbness and headaches.  ?Hematological: Denies adenopathy. Easy bruising, personal or family bleeding history  ?Psychiatric/Behavioral: Denies suicidal ideation, mood changes, confusion, nervousness, sleep disturbance and agitation ? ? ? ?Physical Exam: ?Vitals:  ? 03/11/22 1404  ?BP: 120/70  ?Pulse: 64  ?Temp: 97.6 ?F (36.4 ?C)  ?TempSrc: Oral  ?SpO2: 98%  ?Weight: 201 lb 9.6 oz (91.4 kg)  ? ? ?Body mass index is 30.65 kg/m?. ? ? ?Constitutional: NAD, calm, comfortable ?Eyes: PERRL, lids and conjunctivae normal ?ENMT: Mucous membranes are moist.  ?Respiratory: clear to auscultation bilaterally, no wheezing, no crackles. Normal respiratory effort. No accessory muscle use.  ?Cardiovascular: Regular rate and rhythm, no murmurs / rubs / gallops. No extremity edema.  ?Psychiatric: Normal judgment and insight. Alert and oriented x 3. Normal mood.  ? ? ?Impression and Plan: ? ?Attention deficit hyperactivity disorder (ADHD), predominantly inattentive type  ?-PDMP reviewed, overdose risk score is 190, no red flags. ?-Refill Adderall 20 mg to take 1 tablet twice daily for total of 60 tablets a month x3 months. ? ?Essential hypertension ? - Plan: verapamil (CALAN-SR) 240 MG CR tablet, hydrochlorothiazide (MICROZIDE) 12.5 MG capsule ?-Well-controlled. ? ? ? ?Time spent:24 minutes reviewing chart, interviewing and examining patient and formulating plan of care. ? ? ? ? ?Lelon Frohlich, MD ?[facility] ? ? ?

## 2022-03-13 ENCOUNTER — Telehealth: Payer: Self-pay | Admitting: *Deleted

## 2022-03-13 DIAGNOSIS — F900 Attention-deficit hyperactivity disorder, predominantly inattentive type: Secondary | ICD-10-CM

## 2022-03-13 NOTE — Telephone Encounter (Signed)
Rx needs new directions. ?Directions read take 1 tablet daily #60. ?Should be take 1 tab twice daily # 60 ? ?

## 2022-03-14 ENCOUNTER — Other Ambulatory Visit (INDEPENDENT_AMBULATORY_CARE_PROVIDER_SITE_OTHER): Payer: Self-pay | Admitting: Family Medicine

## 2022-03-14 DIAGNOSIS — R7303 Prediabetes: Secondary | ICD-10-CM

## 2022-03-14 MED ORDER — AMPHETAMINE-DEXTROAMPHETAMINE 20 MG PO TABS: 20.0000 mg | ORAL_TABLET | Freq: Two times a day (BID) | ORAL | 0 refills | Status: DC

## 2022-03-20 NOTE — Progress Notes (Signed)
Chief Complaint:   OBESITY Misty Benson is here to discuss her progress with her obesity treatment plan along with follow-up of her obesity related diagnoses. Misty Benson is on the Category 2 Plan and states she is following her eating plan approximately 85-90% of the time. Misty Benson states she is doing 0 minutes 0 times per week.  Today's visit was #: 16 Starting weight: 249 lbs Starting date: 04/05/2021 Today's weight: 196 lbs Today's date: 03/06/2022 Total lbs lost to date: 53 Total lbs lost since last in-office visit: 5  Interim History: Misty Benson continues to do well with weight loss on her Category 2 plan. She is working on maintaining her nutrition to help with healing after her upcoming hip replacement.   Subjective:   1. Pre-diabetes Misty Benson is doing better remembering to take her metformin.   Assessment/Plan:   1. Pre-diabetes Misty Benson will continue metformin, and we will refill for 1 month. We will recheck labs at her next visit. She will continue to work on weight loss, exercise, and decreasing simple carbohydrates to help decrease the risk of diabetes.   - metFORMIN (GLUCOPHAGE) 500 MG tablet; TAKE 1 TABLET(500 MG) BY MOUTH DAILY WITH LUNCH  Dispense: 30 tablet; Refill: 0  2. Obesity, Current BMI 29.8 Misty Benson is currently in the action stage of change. As such, her goal is to continue with weight loss efforts. She has agreed to the Category 2 Plan.   Behavioral modification strategies: increasing lean protein intake.  Misty Benson has agreed to follow-up with our clinic in 3 weeks. She was informed of the importance of frequent follow-up visits to maximize her success with intensive lifestyle modifications for her multiple health conditions.   Objective:   Blood pressure (!) 130/58, pulse 68, temperature 98 F (36.7 C), height '5\' 8"'$  (1.727 m), weight 196 lb (88.9 kg), last menstrual period 05/30/2012, SpO2 98 %. Body mass index is 29.8 kg/m.  General: Cooperative, alert, well developed, in  no acute distress. HEENT: Conjunctivae and lids unremarkable. Cardiovascular: Regular rhythm.  Lungs: Normal work of breathing. Neurologic: No focal deficits.   Lab Results  Component Value Date   CREATININE 0.73 10/02/2021   BUN 22 10/02/2021   NA 138 10/02/2021   K 4.5 10/02/2021   CL 101 10/02/2021   CO2 27 10/02/2021   Lab Results  Component Value Date   ALT 27 10/02/2021   AST 17 10/02/2021   ALKPHOS 60 10/02/2021   BILITOT 0.4 10/02/2021   Lab Results  Component Value Date   HGBA1C 5.4 10/02/2021   HGBA1C 5.8 (H) 04/05/2021   Lab Results  Component Value Date   INSULIN 9.5 04/05/2021   Lab Results  Component Value Date   TSH 2.220 04/05/2021   Lab Results  Component Value Date   CHOL 231 (H) 10/02/2021   HDL 82 10/02/2021   LDLCALC 134 (H) 10/02/2021   TRIG 90 10/02/2021   CHOLHDL 3.4 02/23/2020   Lab Results  Component Value Date   VD25OH 41.1 04/05/2021   VD25OH 41 01/21/2019   VD25OH 39 07/18/2017   Lab Results  Component Value Date   WBC 5.4 02/23/2020   HGB 13.3 02/23/2020   HCT 40.3 02/23/2020   MCV 93.1 02/23/2020   PLT 280 02/23/2020   No results found for: IRON, TIBC, FERRITIN  Attestation Statements:   Reviewed by clinician on day of visit: allergies, medications, problem list, medical history, surgical history, family history, social history, and previous encounter notes.  Time spent on visit  including pre-visit chart review and post-visit care and charting was 30 minutes.    I, Trixie Dredge, am acting as transcriptionist for Dennard Nip, MD.  I have reviewed the above documentation for accuracy and completeness, and I agree with the above. -  Dennard Nip, MD

## 2022-04-01 ENCOUNTER — Encounter (INDEPENDENT_AMBULATORY_CARE_PROVIDER_SITE_OTHER): Payer: Self-pay | Admitting: Family Medicine

## 2022-04-01 ENCOUNTER — Ambulatory Visit (INDEPENDENT_AMBULATORY_CARE_PROVIDER_SITE_OTHER): Payer: BC Managed Care – PPO | Admitting: Family Medicine

## 2022-04-01 VITALS — BP 118/67 | HR 61 | Temp 98.1°F | Ht 68.0 in | Wt 193.0 lb

## 2022-04-01 DIAGNOSIS — R7303 Prediabetes: Secondary | ICD-10-CM | POA: Diagnosis not present

## 2022-04-01 DIAGNOSIS — E669 Obesity, unspecified: Secondary | ICD-10-CM | POA: Diagnosis not present

## 2022-04-01 DIAGNOSIS — Z7984 Long term (current) use of oral hypoglycemic drugs: Secondary | ICD-10-CM

## 2022-04-01 DIAGNOSIS — Z6829 Body mass index (BMI) 29.0-29.9, adult: Secondary | ICD-10-CM | POA: Diagnosis not present

## 2022-04-09 ENCOUNTER — Ambulatory Visit: Payer: BC Managed Care – PPO

## 2022-04-09 DIAGNOSIS — L988 Other specified disorders of the skin and subcutaneous tissue: Secondary | ICD-10-CM | POA: Diagnosis not present

## 2022-04-09 DIAGNOSIS — D485 Neoplasm of uncertain behavior of skin: Secondary | ICD-10-CM | POA: Diagnosis not present

## 2022-04-10 ENCOUNTER — Ambulatory Visit: Payer: BC Managed Care – PPO

## 2022-04-10 ENCOUNTER — Ambulatory Visit
Admission: RE | Admit: 2022-04-10 | Discharge: 2022-04-10 | Disposition: A | Discharge status: Home / Self Care | Payer: BC Managed Care – PPO | Source: Ambulatory Visit | Attending: Obstetrics & Gynecology | Admitting: Obstetrics & Gynecology

## 2022-04-10 DIAGNOSIS — Z1231 Encounter for screening mammogram for malignant neoplasm of breast: Secondary | ICD-10-CM | POA: Diagnosis not present

## 2022-04-15 DIAGNOSIS — M9903 Segmental and somatic dysfunction of lumbar region: Secondary | ICD-10-CM | POA: Diagnosis not present

## 2022-04-15 DIAGNOSIS — M5440 Lumbago with sciatica, unspecified side: Secondary | ICD-10-CM | POA: Diagnosis not present

## 2022-04-15 DIAGNOSIS — M62830 Muscle spasm of back: Secondary | ICD-10-CM | POA: Diagnosis not present

## 2022-04-15 DIAGNOSIS — M9902 Segmental and somatic dysfunction of thoracic region: Secondary | ICD-10-CM | POA: Diagnosis not present

## 2022-04-15 NOTE — Progress Notes (Signed)
Chief Complaint:   OBESITY Misty Benson is here to discuss her progress with her obesity treatment plan along with follow-up of her obesity related diagnoses. Misty Benson is on the Category 2 Plan and states she is following her eating plan approximately 80-85% of the time. Misty Benson states she is doing 0 minutes 0 times per week.  Today's visit was #: 55 Starting weight: 249 lbs Starting date: 04/05/2021 Today's weight: 193 lbs Today's date: 04/01/2022 Total lbs lost to date: 56 Total lbs lost since last in-office visit: 3  Interim History: Misty Benson continues to do well with weight loss. She has been working on meeting her protein goals. Her sleep is good overall, but her exercise is limited due to knee pain.   Subjective:   1. Pre-diabetes Misty Benson continues to do well with metformin. She is losing weight in a healthy way and polyphagia is decreased.   Assessment/Plan:   1. Pre-diabetes Misty Benson will continue metformin as is, and will continue to work on her diet and exercise.   2. Obesity, Current BMI 29.4 Misty Benson is currently in the action stage of change. As such, her goal is to continue with weight loss efforts. She has agreed to the Category 2 Plan.   Behavioral modification strategies: increasing lean protein intake.  Misty Benson has agreed to follow-up with our clinic in 3 weeks. She was informed of the importance of frequent follow-up visits to maximize her success with intensive lifestyle modifications for her multiple health conditions.   Objective:   Blood pressure 118/67, pulse 61, temperature 98.1 F (36.7 C), height '5\' 8"'$  (1.727 m), weight 193 lb (87.5 kg), last menstrual period 05/30/2012, SpO2 95 %. Body mass index is 29.35 kg/m.  General: Cooperative, alert, well developed, in no acute distress. HEENT: Conjunctivae and lids unremarkable. Cardiovascular: Regular rhythm.  Lungs: Normal work of breathing. Neurologic: No focal deficits.   Lab Results  Component Value Date    CREATININE 0.73 10/02/2021   BUN 22 10/02/2021   NA 138 10/02/2021   K 4.5 10/02/2021   CL 101 10/02/2021   CO2 27 10/02/2021   Lab Results  Component Value Date   ALT 27 10/02/2021   AST 17 10/02/2021   ALKPHOS 60 10/02/2021   BILITOT 0.4 10/02/2021   Lab Results  Component Value Date   HGBA1C 5.4 10/02/2021   HGBA1C 5.8 (H) 04/05/2021   Lab Results  Component Value Date   INSULIN 9.5 04/05/2021   Lab Results  Component Value Date   TSH 2.220 04/05/2021   Lab Results  Component Value Date   CHOL 231 (H) 10/02/2021   HDL 82 10/02/2021   LDLCALC 134 (H) 10/02/2021   TRIG 90 10/02/2021   CHOLHDL 3.4 02/23/2020   Lab Results  Component Value Date   VD25OH 41.1 04/05/2021   VD25OH 41 01/21/2019   VD25OH 39 07/18/2017   Lab Results  Component Value Date   WBC 5.4 02/23/2020   HGB 13.3 02/23/2020   HCT 40.3 02/23/2020   MCV 93.1 02/23/2020   PLT 280 02/23/2020   No results found for: IRON, TIBC, FERRITIN  Attestation Statements:   Reviewed by clinician on day of visit: allergies, medications, problem list, medical history, surgical history, family history, social history, and previous encounter notes.  Time spent on visit including pre-visit chart review and post-visit care and charting was 22 minutes.    I, Trixie Dredge, am acting as transcriptionist for Dennard Nip, MD.  I have reviewed the above documentation for  accuracy and completeness, and I agree with the above. -  Dennard Nip, MD

## 2022-04-16 DIAGNOSIS — M62830 Muscle spasm of back: Secondary | ICD-10-CM | POA: Diagnosis not present

## 2022-04-16 DIAGNOSIS — M9903 Segmental and somatic dysfunction of lumbar region: Secondary | ICD-10-CM | POA: Diagnosis not present

## 2022-04-16 DIAGNOSIS — M9902 Segmental and somatic dysfunction of thoracic region: Secondary | ICD-10-CM | POA: Diagnosis not present

## 2022-04-16 DIAGNOSIS — M5440 Lumbago with sciatica, unspecified side: Secondary | ICD-10-CM | POA: Diagnosis not present

## 2022-04-17 ENCOUNTER — Ambulatory Visit: Payer: BC Managed Care – PPO

## 2022-04-17 DIAGNOSIS — M9902 Segmental and somatic dysfunction of thoracic region: Secondary | ICD-10-CM | POA: Diagnosis not present

## 2022-04-17 DIAGNOSIS — M62830 Muscle spasm of back: Secondary | ICD-10-CM | POA: Diagnosis not present

## 2022-04-17 DIAGNOSIS — M5440 Lumbago with sciatica, unspecified side: Secondary | ICD-10-CM | POA: Diagnosis not present

## 2022-04-17 DIAGNOSIS — M9903 Segmental and somatic dysfunction of lumbar region: Secondary | ICD-10-CM | POA: Diagnosis not present

## 2022-04-20 ENCOUNTER — Other Ambulatory Visit (INDEPENDENT_AMBULATORY_CARE_PROVIDER_SITE_OTHER): Payer: Self-pay | Admitting: Family Medicine

## 2022-04-20 DIAGNOSIS — R7303 Prediabetes: Secondary | ICD-10-CM

## 2022-04-22 DIAGNOSIS — M5440 Lumbago with sciatica, unspecified side: Secondary | ICD-10-CM | POA: Diagnosis not present

## 2022-04-22 DIAGNOSIS — M9903 Segmental and somatic dysfunction of lumbar region: Secondary | ICD-10-CM | POA: Diagnosis not present

## 2022-04-22 DIAGNOSIS — M9902 Segmental and somatic dysfunction of thoracic region: Secondary | ICD-10-CM | POA: Diagnosis not present

## 2022-04-22 DIAGNOSIS — M62830 Muscle spasm of back: Secondary | ICD-10-CM | POA: Diagnosis not present

## 2022-04-25 ENCOUNTER — Ambulatory Visit (INDEPENDENT_AMBULATORY_CARE_PROVIDER_SITE_OTHER): Payer: BC Managed Care – PPO | Admitting: Family Medicine

## 2022-04-30 DIAGNOSIS — G8929 Other chronic pain: Secondary | ICD-10-CM | POA: Diagnosis not present

## 2022-04-30 DIAGNOSIS — M7042 Prepatellar bursitis, left knee: Secondary | ICD-10-CM | POA: Diagnosis not present

## 2022-04-30 DIAGNOSIS — M25562 Pain in left knee: Secondary | ICD-10-CM | POA: Diagnosis not present

## 2022-05-01 ENCOUNTER — Ambulatory Visit (INDEPENDENT_AMBULATORY_CARE_PROVIDER_SITE_OTHER): Payer: BC Managed Care – PPO | Admitting: Nurse Practitioner

## 2022-05-01 ENCOUNTER — Encounter (INDEPENDENT_AMBULATORY_CARE_PROVIDER_SITE_OTHER): Payer: Self-pay | Admitting: Nurse Practitioner

## 2022-05-01 VITALS — BP 109/65 | HR 82 | Temp 98.6°F | Ht 68.0 in | Wt 189.0 lb

## 2022-05-01 DIAGNOSIS — E669 Obesity, unspecified: Secondary | ICD-10-CM | POA: Diagnosis not present

## 2022-05-01 DIAGNOSIS — M25552 Pain in left hip: Secondary | ICD-10-CM

## 2022-05-01 DIAGNOSIS — Z6828 Body mass index (BMI) 28.0-28.9, adult: Secondary | ICD-10-CM

## 2022-05-01 DIAGNOSIS — R7303 Prediabetes: Secondary | ICD-10-CM | POA: Diagnosis not present

## 2022-05-01 DIAGNOSIS — Z6837 Body mass index (BMI) 37.0-37.9, adult: Secondary | ICD-10-CM

## 2022-05-01 DIAGNOSIS — Z7984 Long term (current) use of oral hypoglycemic drugs: Secondary | ICD-10-CM

## 2022-05-01 MED ORDER — METFORMIN HCL 500 MG PO TABS
ORAL_TABLET | ORAL | 0 refills | Status: DC
Start: 2022-05-01 — End: 2022-07-03

## 2022-05-02 NOTE — Progress Notes (Signed)
Chief Complaint:   OBESITY Misty Benson is here to discuss her progress with her obesity treatment plan along with follow-up of her obesity related diagnoses. Misty Benson is on the Category 2 Plan and states she is following her eating plan approximately 75-80% of the time. Misty Benson states she is walking as able.   Today's visit was #: 18 Starting weight: 249 lbs Starting date: 04/05/2021 Today's weight: 189 lbs Today's date: 05/01/2022 Total lbs lost to date: 60 lbs Total lbs lost since last in-office visit: 4  Interim History: Misty Benson has done well with weight loss. She was surprised she lost weight. She's scheduled for hip surgery July 27th. She is aiming for 90 grams of protein and 1400 calories per day.  Subjective:   1. Pre-diabetes Misty Benson is currently taking Metformin 500 mg daily. Denies any side effects or polyphagia.  2. Pain of left hip Misty Benson is scheduled for surgery July 27th.  Assessment/Plan:   1. Pre-diabetes Will obtain labs at next visit. We will refill Metformin 500 mg by mouth daily for 1 month with 0 refills.  Misty Benson will continue to work on weight loss, exercise, and decreasing simple carbohydrates to help decrease the risk of diabetes.    -Refill metFORMIN (GLUCOPHAGE) 500 MG tablet; TAKE 1 TABLET(500 MG) BY MOUTH DAILY WITH LUNCH  Dispense: 30 tablet; Refill: 0  2. Pain of left hip Misty Benson will continue to follow up with Ortho and surgeon. She will start physical therapy 2 weeks after surgery. Discussed the importance of eating enough protein daily and the importance of protein and healing after surgery.    3. Obesity, Current BMI 28.9 Misty Benson is currently in the action stage of change. As such, her goal is to continue with weight loss efforts. She has agreed to the Category 2 Plan.   Exercise goals: As is.  Behavioral modification strategies: no skipping meals, meal planning and cooking strategies, and planning for success.  Misty Benson has agreed to follow-up with  our clinic in 4 weeks. She was informed of the importance of frequent follow-up visits to maximize her success with intensive lifestyle modifications for her multiple health conditions.   Objective:   Blood pressure 109/65, pulse 82, temperature 98.6 F (37 C), height '5\' 8"'$  (1.727 m), weight 189 lb (85.7 kg), last menstrual period 05/30/2012, SpO2 96 %. Body mass index is 28.74 kg/m.  General: Cooperative, alert, well developed, in no acute distress. HEENT: Conjunctivae and lids unremarkable. Cardiovascular: Regular rhythm.  Lungs: Normal work of breathing. Neurologic: No focal deficits.   Lab Results  Component Value Date   CREATININE 0.73 10/02/2021   BUN 22 10/02/2021   NA 138 10/02/2021   K 4.5 10/02/2021   CL 101 10/02/2021   CO2 27 10/02/2021   Lab Results  Component Value Date   ALT 27 10/02/2021   AST 17 10/02/2021   ALKPHOS 60 10/02/2021   BILITOT 0.4 10/02/2021   Lab Results  Component Value Date   HGBA1C 5.4 10/02/2021   HGBA1C 5.8 (H) 04/05/2021   Lab Results  Component Value Date   INSULIN 9.5 04/05/2021   Lab Results  Component Value Date   TSH 2.220 04/05/2021   Lab Results  Component Value Date   CHOL 231 (H) 10/02/2021   HDL 82 10/02/2021   LDLCALC 134 (H) 10/02/2021   TRIG 90 10/02/2021   CHOLHDL 3.4 02/23/2020   Lab Results  Component Value Date   VD25OH 41.1 04/05/2021   VD25OH 41 01/21/2019  VD25OH 39 07/18/2017   Lab Results  Component Value Date   WBC 5.4 02/23/2020   HGB 13.3 02/23/2020   HCT 40.3 02/23/2020   MCV 93.1 02/23/2020   PLT 280 02/23/2020   No results found for: "IRON", "TIBC", "FERRITIN"  Attestation Statements:   Reviewed by clinician on day of visit: allergies, medications, problem list, medical history, surgical history, family history, social history, and previous encounter notes.  I, Misty Benson, RMA, am acting as transcriptionist for Everardo Pacific, FNP..  I have reviewed the above  documentation for accuracy and completeness, and I agree with the above. Everardo Pacific, FNP

## 2022-05-23 ENCOUNTER — Ambulatory Visit (INDEPENDENT_AMBULATORY_CARE_PROVIDER_SITE_OTHER): Payer: BC Managed Care – PPO | Admitting: Family Medicine

## 2022-05-30 ENCOUNTER — Ambulatory Visit (INDEPENDENT_AMBULATORY_CARE_PROVIDER_SITE_OTHER): Payer: BC Managed Care – PPO | Admitting: Family Medicine

## 2022-05-30 ENCOUNTER — Encounter (INDEPENDENT_AMBULATORY_CARE_PROVIDER_SITE_OTHER): Payer: Self-pay | Admitting: Family Medicine

## 2022-05-30 VITALS — BP 149/73 | HR 75 | Temp 98.3°F | Ht 68.0 in | Wt 189.0 lb

## 2022-05-30 DIAGNOSIS — E559 Vitamin D deficiency, unspecified: Secondary | ICD-10-CM

## 2022-05-30 DIAGNOSIS — Z6828 Body mass index (BMI) 28.0-28.9, adult: Secondary | ICD-10-CM

## 2022-05-30 DIAGNOSIS — Z6837 Body mass index (BMI) 37.0-37.9, adult: Secondary | ICD-10-CM

## 2022-05-30 DIAGNOSIS — R7303 Prediabetes: Secondary | ICD-10-CM | POA: Diagnosis not present

## 2022-05-30 DIAGNOSIS — E7800 Pure hypercholesterolemia, unspecified: Secondary | ICD-10-CM

## 2022-05-30 DIAGNOSIS — E669 Obesity, unspecified: Secondary | ICD-10-CM

## 2022-05-30 DIAGNOSIS — I10 Essential (primary) hypertension: Secondary | ICD-10-CM

## 2022-05-31 LAB — CBC WITH DIFFERENTIAL/PLATELET
Basophils Absolute: 0 10*3/uL (ref 0.0–0.2)
Basos: 1 %
EOS (ABSOLUTE): 0.1 10*3/uL (ref 0.0–0.4)
Eos: 2 %
Hematocrit: 41.5 % (ref 34.0–46.6)
Hemoglobin: 13.8 g/dL (ref 11.1–15.9)
Immature Grans (Abs): 0 10*3/uL (ref 0.0–0.1)
Immature Granulocytes: 0 %
Lymphocytes Absolute: 1.4 10*3/uL (ref 0.7–3.1)
Lymphs: 27 %
MCH: 30.7 pg (ref 26.6–33.0)
MCHC: 33.3 g/dL (ref 31.5–35.7)
MCV: 92 fL (ref 79–97)
Monocytes Absolute: 0.4 10*3/uL (ref 0.1–0.9)
Monocytes: 8 %
Neutrophils Absolute: 3.1 10*3/uL (ref 1.4–7.0)
Neutrophils: 62 %
Platelets: 262 10*3/uL (ref 150–450)
RBC: 4.49 x10E6/uL (ref 3.77–5.28)
RDW: 11.9 % (ref 11.7–15.4)
WBC: 5 10*3/uL (ref 3.4–10.8)

## 2022-05-31 LAB — CMP14+EGFR
ALT: 25 IU/L (ref 0–32)
AST: 17 IU/L (ref 0–40)
Albumin/Globulin Ratio: 2.4 — ABNORMAL HIGH (ref 1.2–2.2)
Albumin: 4.4 g/dL (ref 3.9–4.9)
Alkaline Phosphatase: 59 IU/L (ref 44–121)
BUN/Creatinine Ratio: 43 — ABNORMAL HIGH (ref 12–28)
BUN: 30 mg/dL — ABNORMAL HIGH (ref 8–27)
Bilirubin Total: 0.3 mg/dL (ref 0.0–1.2)
CO2: 24 mmol/L (ref 20–29)
Calcium: 9.7 mg/dL (ref 8.7–10.3)
Chloride: 101 mmol/L (ref 96–106)
Creatinine, Ser: 0.7 mg/dL (ref 0.57–1.00)
Globulin, Total: 1.8 g/dL (ref 1.5–4.5)
Glucose: 80 mg/dL (ref 70–99)
Potassium: 4.4 mmol/L (ref 3.5–5.2)
Sodium: 144 mmol/L (ref 134–144)
Total Protein: 6.2 g/dL (ref 6.0–8.5)
eGFR: 98 mL/min/{1.73_m2} (ref >59)

## 2022-05-31 LAB — VITAMIN D 25 HYDROXY (VIT D DEFICIENCY, FRACTURES): Vit D, 25-Hydroxy: 70.7 ng/mL (ref 30.0–100.0)

## 2022-05-31 LAB — LIPID PANEL WITH LDL/HDL RATIO
Cholesterol, Total: 194 mg/dL (ref 100–199)
HDL: 85 mg/dL (ref >39)
LDL Chol Calc (NIH): 96 mg/dL (ref 0–99)
LDL/HDL Ratio: 1.1 ratio (ref 0.0–3.2)
Triglycerides: 70 mg/dL (ref 0–149)
VLDL Cholesterol Cal: 13 mg/dL (ref 5–40)

## 2022-05-31 LAB — INSULIN, RANDOM: INSULIN: 5.1 u[IU]/mL (ref 2.6–24.9)

## 2022-05-31 LAB — HEMOGLOBIN A1C
Est. average glucose Bld gHb Est-mCnc: 111 mg/dL
Hgb A1c MFr Bld: 5.5 % (ref 4.8–5.6)

## 2022-06-05 ENCOUNTER — Telehealth: Payer: Self-pay | Admitting: Internal Medicine

## 2022-06-05 DIAGNOSIS — M1612 Unilateral primary osteoarthritis, left hip: Secondary | ICD-10-CM | POA: Diagnosis not present

## 2022-06-05 NOTE — Progress Notes (Signed)
   Chief Complaint:   OBESITY Misty Benson is here to discuss her progress with her obesity treatment plan along with follow-up of her obesity related diagnoses. Tayleigh is on the Category 2 Plan and states she is following her eating plan approximately 80-90% of the time. Bular states she is doing 0 minutes 0 times per week.  Today's visit was #: 19 Starting weight: 249 lbs Starting date: 04/05/2021 Today's weight: 189 lbs Today's date: 05/30/2022 Total lbs lost to date: 60 Total lbs lost since last in-office visit: 0  Interim History: Misty Benson has done well with maintaining her weight.  Her muscle mass has been decreasing which is likely decreasing her RMR. She will be having hip replacement surgery and will be able to increase strengthening exercise.  Subjective:   1. Primary hypertension Kili's blood pressure is elevated.  She is fasting and she did not take her blood pressure medications yet this morning.  2. Prediabetes Misty Benson is working on her diet and weight loss, and decreasing simple carbohydrates in her diet.  3. Pure hypercholesterolemia Misty Benson is working on decreasing cholesterol in her diet.  She is due to have labs.  4. Vitamin D deficiency Misty Benson is on vitamin D, and she is due to have labs checked.  Assessment/Plan:   1. Primary hypertension We will check labs today, and Kally is to take her blood pressure medications when she gets home.   - CBC with Differential/Platelet  2. Prediabetes We will check labs today, and Sheera will continue with her diet and weight loss.  - CMP14+EGFR - Insulin, random - Hemoglobin A1c  3. Pure hypercholesterolemia We will check labs today. Katalea will continue to work on diet, exercise and weight loss efforts. Orders and follow up as documented in patient record.   - Lipid Panel With LDL/HDL Ratio  4. Vitamin D deficiency We will check labs today. Syrita will follow-up for routine testing of Vitamin D, at least 2-3 times per year to  avoid over-replacement.  - VITAMIN D 25 Hydroxy (Vit-D Deficiency, Fractures)  5. Obesity, Current BMI 28.8 Misty Benson is currently in the action stage of change. As such, her goal is to continue with weight loss efforts. She has agreed to the Category 2 Plan.   Behavioral modification strategies: increasing lean protein intake.  Misty Benson has agreed to follow-up with our clinic in 4 weeks. She was informed of the importance of frequent follow-up visits to maximize her success with intensive lifestyle modifications for her multiple health conditions.   Misty Benson was informed we would discuss her lab results at her next visit unless there is a critical issue that needs to be addressed sooner. Misty Benson agreed to keep her next visit at the agreed upon time to discuss these results.  Objective:   Blood pressure (!) 149/73, pulse 75, temperature 98.3 F (36.8 C), height 5' 8" (1.727 m), weight 189 lb (85.7 kg), last menstrual period 05/30/2012, SpO2 94 %. Body mass index is 28.74 kg/m.  General: Cooperative, alert, well developed, in no acute distress. HEENT: Conjunctivae and lids unremarkable. Cardiovascular: Regular rhythm.  Lungs: Normal work of breathing. Neurologic: No focal deficits.   Lab Results  Component Value Date   CREATININE 0.70 05/30/2022   BUN 30 (H) 05/30/2022   NA 144 05/30/2022   K 4.4 05/30/2022   CL 101 05/30/2022   CO2 24 05/30/2022   Lab Results  Component Value Date   ALT 25 05/30/2022   AST 17 05/30/2022   ALKPHOS 59 05/30/2022     BILITOT 0.3 05/30/2022   Lab Results  Component Value Date   HGBA1C 5.5 05/30/2022   HGBA1C 5.4 10/02/2021   HGBA1C 5.8 (H) 04/05/2021   Lab Results  Component Value Date   INSULIN 5.1 05/30/2022   INSULIN 9.5 04/05/2021   Lab Results  Component Value Date   TSH 2.220 04/05/2021   Lab Results  Component Value Date   CHOL 194 05/30/2022   HDL 85 05/30/2022   LDLCALC 96 05/30/2022   TRIG 70 05/30/2022   CHOLHDL 3.4 02/23/2020    Lab Results  Component Value Date   VD25OH 70.7 05/30/2022   VD25OH 41.1 04/05/2021   VD25OH 41 01/21/2019   Lab Results  Component Value Date   WBC 5.0 05/30/2022   HGB 13.8 05/30/2022   HCT 41.5 05/30/2022   MCV 92 05/30/2022   PLT 262 05/30/2022   No results found for: "IRON", "TIBC", "FERRITIN"  Attestation Statements:   Reviewed by clinician on day of visit: allergies, medications, problem list, medical history, surgical history, family history, social history, and previous encounter notes.   I, Trixie Dredge, am acting as transcriptionist for Dennard Nip, MD.  I have reviewed the above documentation for accuracy and completeness, and I agree with the above. -  Dennard Nip, MD

## 2022-06-05 NOTE — Telephone Encounter (Signed)
Marsha @ [facility] calling to request Lab results for Patient, ASAP.   Pt is scheduled for surgery tomorrow morning.   Fax:  [phone] Attn: Rosann Auerbach

## 2022-06-05 NOTE — Telephone Encounter (Signed)
Left detailed message on machine there are no current labs to fax.

## 2022-06-06 DIAGNOSIS — M1612 Unilateral primary osteoarthritis, left hip: Secondary | ICD-10-CM | POA: Diagnosis not present

## 2022-06-06 HISTORY — PX: TOTAL HIP ARTHROPLASTY: SHX124

## 2022-06-07 ENCOUNTER — Ambulatory Visit: Payer: BC Managed Care – PPO | Admitting: Obstetrics & Gynecology

## 2022-06-07 DIAGNOSIS — Z96642 Presence of left artificial hip joint: Secondary | ICD-10-CM | POA: Diagnosis not present

## 2022-06-07 DIAGNOSIS — M1612 Unilateral primary osteoarthritis, left hip: Secondary | ICD-10-CM | POA: Diagnosis not present

## 2022-06-08 ENCOUNTER — Other Ambulatory Visit (INDEPENDENT_AMBULATORY_CARE_PROVIDER_SITE_OTHER): Payer: Self-pay | Admitting: Nurse Practitioner

## 2022-06-08 DIAGNOSIS — R7303 Prediabetes: Secondary | ICD-10-CM

## 2022-06-11 DIAGNOSIS — M25552 Pain in left hip: Secondary | ICD-10-CM | POA: Diagnosis not present

## 2022-06-13 DIAGNOSIS — M25552 Pain in left hip: Secondary | ICD-10-CM | POA: Diagnosis not present

## 2022-06-18 DIAGNOSIS — M25552 Pain in left hip: Secondary | ICD-10-CM | POA: Diagnosis not present

## 2022-06-19 ENCOUNTER — Encounter (INDEPENDENT_AMBULATORY_CARE_PROVIDER_SITE_OTHER): Payer: Self-pay

## 2022-06-20 DIAGNOSIS — M25552 Pain in left hip: Secondary | ICD-10-CM | POA: Diagnosis not present

## 2022-06-24 DIAGNOSIS — M1612 Unilateral primary osteoarthritis, left hip: Secondary | ICD-10-CM | POA: Diagnosis not present

## 2022-06-24 DIAGNOSIS — Z96642 Presence of left artificial hip joint: Secondary | ICD-10-CM | POA: Diagnosis not present

## 2022-06-25 DIAGNOSIS — M25552 Pain in left hip: Secondary | ICD-10-CM | POA: Diagnosis not present

## 2022-07-02 DIAGNOSIS — M25552 Pain in left hip: Secondary | ICD-10-CM | POA: Diagnosis not present

## 2022-07-03 ENCOUNTER — Encounter (INDEPENDENT_AMBULATORY_CARE_PROVIDER_SITE_OTHER): Payer: Self-pay | Admitting: Family Medicine

## 2022-07-03 ENCOUNTER — Ambulatory Visit (INDEPENDENT_AMBULATORY_CARE_PROVIDER_SITE_OTHER): Payer: BC Managed Care – PPO | Admitting: Family Medicine

## 2022-07-03 VITALS — BP 130/72 | HR 67 | Temp 98.2°F | Ht 68.0 in | Wt 193.0 lb

## 2022-07-03 DIAGNOSIS — Z6829 Body mass index (BMI) 29.0-29.9, adult: Secondary | ICD-10-CM

## 2022-07-03 DIAGNOSIS — R030 Elevated blood-pressure reading, without diagnosis of hypertension: Secondary | ICD-10-CM

## 2022-07-03 DIAGNOSIS — E669 Obesity, unspecified: Secondary | ICD-10-CM

## 2022-07-03 DIAGNOSIS — R7303 Prediabetes: Secondary | ICD-10-CM

## 2022-07-03 MED ORDER — METFORMIN HCL 500 MG PO TABS: ORAL_TABLET | ORAL | 0 refills | Status: DC

## 2022-07-04 ENCOUNTER — Ambulatory Visit (INDEPENDENT_AMBULATORY_CARE_PROVIDER_SITE_OTHER): Payer: BC Managed Care – PPO | Admitting: Family Medicine

## 2022-07-05 DIAGNOSIS — M25552 Pain in left hip: Secondary | ICD-10-CM | POA: Diagnosis not present

## 2022-07-09 DIAGNOSIS — M25552 Pain in left hip: Secondary | ICD-10-CM | POA: Diagnosis not present

## 2022-07-11 NOTE — Progress Notes (Signed)
Chief Complaint:   OBESITY Misty Benson is here to discuss her progress with her obesity treatment plan along with follow-up of her obesity related diagnoses. Misty Benson is on the Category 2 Plan and states she is following her eating plan approximately 50% of the time. Misty Benson states she is doing 0 minutes 0 times per week.  Today's visit was #: 20 Starting weight: 249 lbs Starting date: 04/05/2021 Today's weight: 193 lbs Today's date: 07/03/2022 Total lbs lost to date: 56 Total lbs lost since last in-office visit: 0  Interim History: Misty Benson is up 4 pounds but the scale suggests this is mostly due to fluid retention.  She had hip replacement surgery and she is recovering well, but she has struggled to follow her plan as closely.  Subjective:   1. Elevated blood-pressure reading without diagnosis of hypertension Misty Benson's blood pressure was elevated at her last visit with no history of hypertension or antihypertensive therapy.  Her blood pressure is back to normal today.  2. Pre-diabetes Misty Benson has been off metformin while recovering from surgery, but she is ready to restart her medications and continue to work on her weight loss.  Assessment/Plan:   1. Elevated blood-pressure reading without diagnosis of hypertension Misty Benson will continue with her diet and exercise, and we will continue to follow.  2. Pre-diabetes Misty Benson will continue metformin 500 mg every morning, and we will refill for 90 days.  - metFORMIN (GLUCOPHAGE) 500 MG tablet; TAKE 1 TABLET(500 MG) BY MOUTH DAILY WITH LUNCH  Dispense: 90 tablet; Refill: 0  3. Obesity, Current BMI 29.4 Misty Benson is currently in the action stage of change. As such, her goal is to continue with weight loss efforts. She has agreed to the Category 2 Plan.   Behavioral modification strategies: no skipping meals.  Misty Benson has agreed to follow-up with our clinic in 2 weeks. She was informed of the importance of frequent follow-up visits to maximize her success  with intensive lifestyle modifications for her multiple health conditions.   Objective:   Blood pressure 130/72, pulse 67, temperature 98.2 F (36.8 C), height '5\' 8"'$  (1.727 m), weight 193 lb (87.5 kg), last menstrual period 05/30/2012, SpO2 97 %. Body mass index is 29.35 kg/m.  General: Cooperative, alert, well developed, in no acute distress. HEENT: Conjunctivae and lids unremarkable. Cardiovascular: Regular rhythm.  Lungs: Normal work of breathing. Neurologic: No focal deficits.   Lab Results  Component Value Date   CREATININE 0.70 05/30/2022   BUN 30 (H) 05/30/2022   NA 144 05/30/2022   K 4.4 05/30/2022   CL 101 05/30/2022   CO2 24 05/30/2022   Lab Results  Component Value Date   ALT 25 05/30/2022   AST 17 05/30/2022   ALKPHOS 59 05/30/2022   BILITOT 0.3 05/30/2022   Lab Results  Component Value Date   HGBA1C 5.5 05/30/2022   HGBA1C 5.4 10/02/2021   HGBA1C 5.8 (H) 04/05/2021   Lab Results  Component Value Date   INSULIN 5.1 05/30/2022   INSULIN 9.5 04/05/2021   Lab Results  Component Value Date   TSH 2.220 04/05/2021   Lab Results  Component Value Date   CHOL 194 05/30/2022   HDL 85 05/30/2022   LDLCALC 96 05/30/2022   TRIG 70 05/30/2022   CHOLHDL 3.4 02/23/2020   Lab Results  Component Value Date   VD25OH 70.7 05/30/2022   VD25OH 41.1 04/05/2021   VD25OH 41 01/21/2019   Lab Results  Component Value Date   WBC 5.0 05/30/2022  HGB 13.8 05/30/2022   HCT 41.5 05/30/2022   MCV 92 05/30/2022   PLT 262 05/30/2022   No results found for: "IRON", "TIBC", "FERRITIN"  Attestation Statements:   Reviewed by clinician on day of visit: allergies, medications, problem list, medical history, surgical history, family history, social history, and previous encounter notes.  Time spent on visit including pre-visit chart review and post-visit care and charting was 46 minutes.   I, Trixie Dredge, am acting as transcriptionist for Dennard Nip, MD.  I have  reviewed the above documentation for accuracy and completeness, and I agree with the above. -  Dennard Nip, MD

## 2022-07-12 ENCOUNTER — Encounter (INDEPENDENT_AMBULATORY_CARE_PROVIDER_SITE_OTHER): Payer: Self-pay | Admitting: Family Medicine

## 2022-07-17 ENCOUNTER — Ambulatory Visit (INDEPENDENT_AMBULATORY_CARE_PROVIDER_SITE_OTHER): Payer: BC Managed Care – PPO | Admitting: Family Medicine

## 2022-07-17 DIAGNOSIS — M25552 Pain in left hip: Secondary | ICD-10-CM | POA: Diagnosis not present

## 2022-07-23 ENCOUNTER — Ambulatory Visit (INDEPENDENT_AMBULATORY_CARE_PROVIDER_SITE_OTHER): Payer: BC Managed Care – PPO | Admitting: Family Medicine

## 2022-07-23 ENCOUNTER — Encounter (INDEPENDENT_AMBULATORY_CARE_PROVIDER_SITE_OTHER): Payer: Self-pay | Admitting: Family Medicine

## 2022-07-23 VITALS — BP 111/68 | HR 70 | Temp 98.5°F | Ht 68.0 in | Wt 192.0 lb

## 2022-07-23 DIAGNOSIS — E669 Obesity, unspecified: Secondary | ICD-10-CM

## 2022-07-23 DIAGNOSIS — Z6829 Body mass index (BMI) 29.0-29.9, adult: Secondary | ICD-10-CM | POA: Diagnosis not present

## 2022-07-23 DIAGNOSIS — R7303 Prediabetes: Secondary | ICD-10-CM | POA: Diagnosis not present

## 2022-07-23 DIAGNOSIS — E559 Vitamin D deficiency, unspecified: Secondary | ICD-10-CM | POA: Diagnosis not present

## 2022-07-29 ENCOUNTER — Telehealth: Payer: Self-pay | Admitting: Internal Medicine

## 2022-07-29 NOTE — Telephone Encounter (Signed)
Pt states she had covid exposure on 07/26/22. C/o congestion & drainage with cough; symptoms started 9/17. Denies fever. Has not tested yet; states she will text tomorrow morning. She will call in AM after testing. Advised she can schedule VV with Dr Maudie Mercury if needed. Pt verb understanding.

## 2022-07-29 NOTE — Progress Notes (Unsigned)
Chief Complaint:   OBESITY Misty Benson is here to discuss her progress with her obesity treatment plan along with follow-up of her obesity related diagnoses. Misty Benson is on the Category 2 Plan and states she is following her eating plan approximately 60% of the time. Misty Benson states she is walking and doing yard work for 60 minutes 3 times per week.  Today's visit was #: 21 Starting weight: 249 lbs Starting date: 04/05/2021 Today's weight: 192 lbs Today's date: 07/23/2022 Total lbs lost to date: 57 Total lbs lost since last in-office visit: 1  Interim History: Misty Benson has done well with weight loss.  She notes more cravings and she sometimes skips meals.  She does breakfast and lunch, but struggles more with dinner.  She has not been meal planning as much.  Subjective:   1. Pre-diabetes Misty Benson is on metformin, but she notes missing some doses.  2. Vitamin D deficiency Misty Benson is on OTC vitamin D, but her level was not yet at goal.  Assessment/Plan:   1. Pre-diabetes Misty Benson agreed to increase metformin to twice daily (no refill needed). She will continue to work on weight loss, exercise, and decreasing simple carbohydrates to help decrease the risk of diabetes.   2. Vitamin D deficiency Misty Benson will continue OTC vitamin D, and we will plan to recheck labs in 1 to 2 months.  3. Obesity, Current BMI 29.3 Misty Benson is currently in the action stage of change. As such, her goal is to continue with weight loss efforts. She has agreed to the Category 2 Plan and keeping a food journal and adhering to recommended goals of 400-550 calories and 40+ grams of protein at supper daily.   Exercise goals: As is.   Behavioral modification strategies: increasing lean protein intake and no skipping meals.  Misty Benson has agreed to follow-up with our clinic in 3 weeks. She was informed of the importance of frequent follow-up visits to maximize her success with intensive lifestyle modifications for her multiple health  conditions.   Objective:   Blood pressure 111/68, pulse 70, temperature 98.5 F (36.9 C), height '5\' 8"'$  (1.727 m), weight 192 lb (87.1 kg), last menstrual period 05/30/2012, SpO2 99 %. Body mass index is 29.19 kg/m.  General: Cooperative, alert, well developed, in no acute distress. HEENT: Conjunctivae and lids unremarkable. Cardiovascular: Regular rhythm.  Lungs: Normal work of breathing. Neurologic: No focal deficits.   Lab Results  Component Value Date   CREATININE 0.70 05/30/2022   BUN 30 (H) 05/30/2022   NA 144 05/30/2022   K 4.4 05/30/2022   CL 101 05/30/2022   CO2 24 05/30/2022   Lab Results  Component Value Date   ALT 25 05/30/2022   AST 17 05/30/2022   ALKPHOS 59 05/30/2022   BILITOT 0.3 05/30/2022   Lab Results  Component Value Date   HGBA1C 5.5 05/30/2022   HGBA1C 5.4 10/02/2021   HGBA1C 5.8 (H) 04/05/2021   Lab Results  Component Value Date   INSULIN 5.1 05/30/2022   INSULIN 9.5 04/05/2021   Lab Results  Component Value Date   TSH 2.220 04/05/2021   Lab Results  Component Value Date   CHOL 194 05/30/2022   HDL 85 05/30/2022   LDLCALC 96 05/30/2022   TRIG 70 05/30/2022   CHOLHDL 3.4 02/23/2020   Lab Results  Component Value Date   VD25OH 70.7 05/30/2022   VD25OH 41.1 04/05/2021   VD25OH 41 01/21/2019   Lab Results  Component Value Date   WBC 5.0  05/30/2022   HGB 13.8 05/30/2022   HCT 41.5 05/30/2022   MCV 92 05/30/2022   PLT 262 05/30/2022   No results found for: "IRON", "TIBC", "FERRITIN"  Attestation Statements:   Reviewed by clinician on day of visit: allergies, medications, problem list, medical history, surgical history, family history, social history, and previous encounter notes.   I, Trixie Dredge, am acting as transcriptionist for Dennard Nip, MD.  I have reviewed the above documentation for accuracy and completeness, and I agree with the above. -  Dennard Nip, MD

## 2022-07-29 NOTE — Telephone Encounter (Signed)
Pt called to say she and her husband were exposed to Covid at a nursing home this weekend. Pt states she has some mild symptoms and her husband has none yet.   They have not been tested yet, but want to know if MD could prescribe Paxlovid.  Pt stated they will test today or tomorrow at home and call back with the results.  Please advise.

## 2022-07-30 ENCOUNTER — Telehealth (INDEPENDENT_AMBULATORY_CARE_PROVIDER_SITE_OTHER): Payer: BC Managed Care – PPO | Admitting: Family Medicine

## 2022-07-30 ENCOUNTER — Other Ambulatory Visit: Payer: Self-pay

## 2022-07-30 ENCOUNTER — Encounter: Payer: Self-pay | Admitting: Family Medicine

## 2022-07-30 VITALS — Temp 99.4°F

## 2022-07-30 DIAGNOSIS — U071 COVID-19: Secondary | ICD-10-CM

## 2022-07-30 MED ORDER — MOLNUPIRAVIR EUA 200MG CAPSULE: 4.0000 | ORAL_CAPSULE | Freq: Two times a day (BID) | ORAL | 0 refills | Status: AC

## 2022-07-30 MED ORDER — BENZONATATE 100 MG PO CAPS: ORAL_CAPSULE | ORAL | 0 refills | Status: DC

## 2022-07-30 NOTE — Progress Notes (Signed)
Virtual Visit via Video Note  I connected with Misty Benson  on 07/30/22 at 12:20 PM EDT by a video enabled telemedicine application and verified that I am speaking with the correct person using two identifiers.  Location patient: [place] Location provider:work or home office Persons participating in the virtual visit: patient, provider  I discussed the limitations and requested verbal permission for telemedicine visit. The patient expressed understanding and agreed to proceed.   HPI:  Acute telemedicine visit for Covid19: -Onset: exposed 4 days ago, symptoms started 2 days ago, tested positive today -Symptoms include:nasal congestion, scratchy throat, cough, low grade fever  -Denies:CP, SOB, NVD -drinking fluids -Has tried: musinex, sudafed,  tylenol -Pertinent past medical history: see below, has had covid once in the past about 1.5 years ago, eGFR 98 in July -Pertinent medication allergies:No Known Allergies -COVID-19 vaccine status: 2 doses and 4 boosters - had most recent booster last fall Immunization History  Administered Date(s) Administered   Hepatitis A 04/19/2019   Hepatitis A, Adult 10/05/2018, 04/19/2019   IPV 06/30/2021   Influenza Split 07/22/2012   Influenza Whole 10/12/2009, 08/28/2010, 08/13/2011   Influenza, Quadrivalent, Recombinant, Inj, Pf 08/25/2018   Influenza, Seasonal, Injecte, Preservative Fre 08/05/2015   Influenza,inj,Quad PF,6+ Mos 08/04/2013, 08/10/2014, 08/06/2016, 08/12/2017, 07/13/2020   Influenza-Unspecified 07/13/2020, 07/12/2021   MMR 12/31/2013, 05/29/2018   PFIZER Comirnaty(Gray Top)Covid-19 Tri-Sucrose Vaccine 01/26/2020, 02/16/2020   PFIZER(Purple Top)SARS-COV-2 Vaccination 01/26/2020, 02/16/2020, 08/28/2020   Td 09/13/2006   Tdap 11/17/2013   Zoster Recombinat (Shingrix) 07/13/2020, 12/31/2020     ROS: See pertinent positives and negatives per HPI.  Past Medical History:  Diagnosis Date   ADHD (attention deficit hyperactivity disorder)     Anxiety    Back pain    Cancer ([place])    BASAL CELL FACE AND SHOULDERS , SQUAMOS CELL ON HAND AND MELANOMA LEFT ARM...   Cervix cancer ([place]) 1987   microinvasive   Constipation    Edema of both lower extremities    Hypertension    Joint pain    Leiomyoma 2013   multiple small   Melanoma ([place])    Obesity    Prediabetes    Shoulder pain    Snoring     Past Surgical History:  Procedure Laterality Date   arm surgery     to remove melanoma on left upper outter arm   BUNIONECTOMY     LEFT FOOT WITH PINS   CERVICAL CONE BIOPSY  1987   CESAREAN SECTION     DILATION AND CURETTAGE OF UTERUS     x2   FOOT SURGERY     MELANOMA EXCISION     TONSILLECTOMY     as a child     Current Outpatient Medications:    benzonatate (TESSALON PERLES) 100 MG capsule, 1-2 capsules up to twice daily as needed for cough, Disp: 30 capsule, Rfl: 0   molnupiravir EUA (LAGEVRIO) 200 mg CAPS capsule, Take 4 capsules (800 mg total) by mouth 2 (two) times daily for 5 days., Disp: 40 capsule, Rfl: 0   amphetamine-dextroamphetamine (ADDERALL) 20 MG tablet, Take 1 tablet (20 mg total) by mouth 2 (two) times daily., Disp: 60 tablet, Rfl: 0   amphetamine-dextroamphetamine (ADDERALL) 20 MG tablet, Take 1 tablet (20 mg total) by mouth 2 (two) times daily., Disp: 60 tablet, Rfl: 0   amphetamine-dextroamphetamine (ADDERALL) 20 MG tablet, Take 1 tablet (20 mg total) by mouth 2 (two) times daily., Disp: 60 tablet, Rfl: 0   calcium carbonate (OS-CAL) 600 MG  TABS tablet, Take 600 mg by mouth 2 (two) times daily with a meal., Disp: , Rfl:    Cholecalciferol (VITAMIN D3) 125 MCG (5000 UT) CAPS, Take 1 cap daily Monday through Friday and 2 caps daily Saturday and Sunday., Disp: 30 capsule, Rfl: 0   citalopram (CELEXA) 20 MG tablet, TAKE 1 TABLET(20 MG) BY MOUTH DAILY, Disp: 90 tablet, Rfl: 0   cyclobenzaprine (FLEXERIL) 10 MG tablet, Take 1 tablet by mouth 3 (three) times daily as needed., Disp: , Rfl:    diclofenac  (VOLTAREN) 50 MG EC tablet, Take 50 mg by mouth 2 (two) times daily as needed., Disp: , Rfl:    gabapentin (NEURONTIN) 300 MG capsule, Take 300 mg by mouth at bedtime as needed., Disp: , Rfl:    hydrochlorothiazide (MICROZIDE) 12.5 MG capsule, TAKE 1 CAPSULE(12.5 MG) BY MOUTH DAILY, Disp: 90 capsule, Rfl: 0   ibuprofen (ADVIL,MOTRIN) 200 MG tablet, Take 3-4 tablets (600-800 mg total) by mouth every 6 (six) hours as needed for fever, headache, mild pain, moderate pain or cramping., Disp: 30 tablet, Rfl: 6   metFORMIN (GLUCOPHAGE) 500 MG tablet, Take 500 mg by mouth 2 (two) times daily with a meal., Disp: , Rfl:    Omega-3 1000 MG CAPS, Take by mouth., Disp: , Rfl:    traMADol (ULTRAM) 50 MG tablet, Take 1-2 tablets by mouth 2 (two) times daily as needed., Disp: , Rfl:    verapamil (CALAN-SR) 240 MG CR tablet, TAKE 1 TABLET(240 MG) BY MOUTH DAILY, Disp: 90 tablet, Rfl: 1  EXAM:  VITALS per patient if applicable:  GENERAL: alert, oriented, appears well and in no acute distress  HEENT: atraumatic, conjunttiva clear, no obvious abnormalities on inspection of external nose and ears  NECK: normal movements of the head and neck  LUNGS: on inspection no signs of respiratory distress, breathing rate appears normal, no obvious gross SOB, gasping or wheezing  CV: no obvious cyanosis  MS: moves all visible extremities without noticeable abnormality  PSYCH/NEURO: pleasant and cooperative, no obvious depression or anxiety, speech and thought processing grossly intact  ASSESSMENT AND PLAN:  Discussed the following assessment and plan:  COVID-19   Discussed treatment options, side effect and risk of drug interactions, ideal treatment window, potential complications, isolation and precautions for COVID-19.  Discussed possibility of rebound with or without antivirals. Checked for/reviewed last GFR - listed in HPI if available. After lengthy discussion, the patient opted for treatment with [facility]rio due  to being higher risk for complications of covid or severe disease and other factors. She preferred this after discussion options, potential drug interactions with Paxlovid, etc - she had taken this medication before.Discussed preliminary limited knowledge of risks/interactions/side effects per EUA document vs possible benefits and precautions.  The patient did want a prescription for cough, Tessalon Rx sent.  Other symptomatic care measures summarized in patient instructions. Work/School slipped offered:  declined Advised to seek prompt virtual visit or in person care if worsening, new symptoms arise, or if is not improving with treatment as expected per our conversation of expected course. Discussed options for follow up care. Did let this patient know that I do telemedicine on Tuesdays and Thursdays for [facility] and those are the days I am logged into the system. Advised to schedule follow up visit with PCP, [facility] virtual visits or [facility] if any further questions or concerns to avoid delays in care.   I discussed the assessment and treatment plan with the patient. The patient was provided an opportunity  to ask questions and all were answered. The patient agreed with the plan and demonstrated an understanding of the instructions.     Lucretia Kern, DO

## 2022-07-30 NOTE — Patient Instructions (Addendum)
HOME CARE TIPS:  -COVID19 testing information: [email or web address]  Most pharmacies also offer testing and home test kits. If the Covid19 test is positive and you desire antiviral treatment, please contact a [facility] [place] or schedule a follow up virtual visit through your primary care office or through the [facility].  Other test to treat options: [email or web address]  -I sent the medication(s) we discussed to your pharmacy: Meds ordered this encounter  Medications   molnupiravir EUA (LAGEVRIO) 200 mg CAPS capsule    Sig: Take 4 capsules (800 mg total) by mouth 2 (two) times daily for 5 days.    Dispense:  40 capsule    Refill:  0   benzonatate (TESSALON PERLES) 100 MG capsule    Sig: 1-2 capsules up to twice daily as needed for cough    Dispense:  30 capsule    Refill:  0     -I sent in the [place] treatment or referral you requested per our discussion. Please see the information provided below and discuss further with the pharmacist/treatment team.   -there is a chance of rebound illness with covid after improving. This can happen whether or not you take an antiviral treatment. If you become sick again with covid after getting better, please schedule a follow up virtual visit and isolate again.  -can use tylenol if needed for fevers, aches and pains per instructions  -nasal saline sinus rinses twice daily  -stay hydrated, drink plenty of fluids and eat small healthy meals - avoid dairy  -can take 1000 IU (75mg) Vit D3 and 100-500 mg of Vit C daily per instructions  -follow up with your doctor in 2-3 days unless improving and feeling better  -stay home while sick, except to seek medical care. If you have COVID19, you will likely be contagious for 7-10 days. Flu or Influenza is likely contagious for about 7 days. Other respiratory viral infections remain contagious for 5-10+ days depending on the virus and  many other factors. Wear a good mask that fits snugly (such as N95 or KN95) if around others to reduce the risk of transmission.  It was nice to meet you today, and I really hope you are feeling better soon. I help [facility] out with telemedicine visits on Tuesdays and Thursdays and am happy to help if you need a follow up virtual visit on those days. Otherwise, if you have any concerns or questions following this visit please schedule a follow up visit with your Primary Care doctor or seek care at a local urgent care clinic to avoid delays in care.    Seek in person care or schedule a follow up video visit promptly if your symptoms worsen, new concerns arise or you are not improving with treatment. Call 911 and/or seek emergency care if your symptoms are severe or life threatening.  PLEASE SEE THE FOLLOWING LINK FOR THE MOST UPDATED INFORMATION ABOUT LAGEVRIO:  [email or web address]      Fact Sheet for Patients And Caregivers Emergency Use Authorization (EUA) Of LAGEVRIOT (molnupiravir) capsules For Coronavirus Disease 2019 (COVID-19)  What is the most important information I should know about LAGEVRIO? LAGEVRIO may cause serious side effects, including: ? LAGEVRIO may cause harm to your unborn baby. It is not known if LAGEVRIO will harm your baby if you take LAGEVRIO during pregnancy. o LAGEVRIO is not recommended for use in pregnancy. o LAGEVRIO has not been studied in pregnancy. LAGEVRIO was studied in pregnant animals only. When LAGEVRIO was given to pregnant animals, LAGEVRIO caused  harm to their unborn babies. o You and your healthcare provider may decide that you should take LAGEVRIO during pregnancy if there are no other COVID-19 treatment options approved or authorized by the FDA that are accessible or clinically appropriate for you. o If you and your healthcare provider decide that you should take LAGEVRIO during pregnancy, you and your healthcare provider should  discuss the known and potential benefits and the potential risks of taking LAGEVRIO during pregnancy. For individuals who are able to become pregnant: ? You should use a reliable method of birth control (contraception) consistently and correctly during treatment with LAGEVRIO and for 4 days after the last dose of LAGEVRIO. Talk to your healthcare provider about reliable birth control methods. ? Before starting treatment with [facility] your healthcare provider may do a pregnancy test to see if you are pregnant before starting treatment with LAGEVRIO. ? Tell your healthcare provider right away if you become pregnant or think you may be pregnant during treatment with LAGEVRIO. Pregnancy Surveillance Program: ? There is a pregnancy surveillance program for individuals who take LAGEVRIO during pregnancy. The purpose of this program is to collect information about the health of you and your baby. Talk to your healthcare provider about how to take part in this program. ? If you take LAGEVRIO during pregnancy and you agree to participate in the pregnancy surveillance program and allow your healthcare provider to share your information with [place], then your healthcare provider will report your use of [place] during pregnancy to [place]. by calling [phone] or [email or web address]. For individuals who are sexually active with partners who are able to become pregnant: ? It is not known if LAGEVRIO can affect sperm. While the risk is regarded as low, animal studies to fully assess the potential for LAGEVRIO to affect the babies of males treated with LAGEVRIO have not been completed. A reliable method of birth control (contraception) should be used consistently and correctly during treatment with LAGEVRIO and for at least 3 months after the last dose. The risk to sperm beyond 3 months is not known. Studies to understand the risk to sperm beyond 3 months are  ongoing. Talk to your healthcare provider about reliable birth control methods. Talk to your healthcare provider if you have questions or concerns about how LAGEVRIO may affect sperm. You are being given this fact sheet because your healthcare provider believes it is necessary to provide you with LAGEVRIO for the treatment of adults with mild-to-moderate coronavirus disease 2019 (COVID-19) with positive results of direct SARS-CoV-2 viral testing, and who are at high risk for progression to severe COVID-19 including hospitalization or death, and for whom other COVID-19 treatment options approved or authorized by the FDA are not accessible or clinically appropriate. The U.S. Food and Drug Administration (FDA) has issued an Emergency Use Authorization (EUA) to make LAGEVRIO available during the COVID-19 pandemic (for more details about an EUA please see "What is an Emergency Use Authorization?" at the end of this document). LAGEVRIO is not an FDA-approved medicine in the [place]. Read this Fact Sheet for information about LAGEVRIO. Talk to your healthcare provider about your options if you have any questions. It is your choice to take LAGEVRIO.  What is COVID-19? COVID-19 is caused by a virus called a coronavirus. You can get COVID-19 through close contact with another person who has the virus. COVID-19 illnesses have ranged from very mild-to-severe, including illness resulting in death. While information so far suggests that  most COVID-19 illness is mild, serious illness can happen and may cause some of your other medical conditions to become worse. Older people and people of all ages with severe, long lasting (chronic) medical conditions like heart disease, lung disease and diabetes, for example seem to be at higher risk of being hospitalized for COVID-19.  What is LAGEVRIO? LAGEVRIO is an investigational medicine used to treat mild-to-moderate COVID-19 in adults: ? with positive  results of direct SARS-CoV-2 viral testing, and ? who are at high risk for progression to severe COVID-19 including hospitalization or death, and for whom other COVID-19 treatment options approved or authorized by the FDA are not accessible or clinically appropriate. The FDA has authorized the emergency use of LAGEVRIO for the treatment of mild-tomoderate COVID-19 in adults under an EUA. For more information on EUA, see the "What is an Emergency Use Authorization (EUA)?" section at the end of this Fact Sheet. LAGEVRIO is not authorized: ? for use in people less than 31 years of age. ? for prevention of COVID-19. ? for people needing hospitalization for COVID-19. ? for use for longer than 5 consecutive days.  What should I tell my healthcare provider before I take LAGEVRIO? Tell your healthcare provider if you: ? Have any allergies ? Are breastfeeding or plan to breastfeed ? Have any serious illnesses ? Are taking any medicines (prescription, over-the-counter, vitamins, or herbal products).  How do I take LAGEVRIO? ? Take LAGEVRIO exactly as your healthcare provider tells you to take it. ? Take 4 capsules of LAGEVRIO every 12 hours (for example, at 8 am and at 8 pm) ? Take LAGEVRIO for 5 days. It is important that you complete the full 5 days of treatment with LAGEVRIO. Do not stop taking LAGEVRIO before you complete the full 5 days of treatment, even if you feel better. ? Take LAGEVRIO with or without food. ? You should stay in isolation for as long as your healthcare provider tells you to. Talk to your healthcare provider if you are not sure about how to properly isolate while you have COVID-19. ? Swallow LAGEVRIO capsules whole. Do not open, break, or crush the capsules. If you cannot swallow capsules whole, tell your healthcare provider. ? What to do if you miss a dose: o If it has been less than 10 hours since the missed dose, take it as soon as you remember o If it has been  more than 10 hours since the missed dose, skip the missed dose and take your dose at the next scheduled time. ? Do not double the dose of LAGEVRIO to make up for a missed dose.  What are the important possible side effects of LAGEVRIO? ? See, "What is the most important information I should know about LAGEVRIO?" ? Allergic Reactions. Allergic reactions can happen in people taking LAGEVRIO, even after only 1 dose. Stop taking LAGEVRIO and call your healthcare provider right away if you get any of the following symptoms of an allergic reaction: o hives o rapid heartbeat o trouble swallowing or breathing o swelling of the mouth, lips, or face o throat tightness o hoarseness o skin rash The most common side effects of LAGEVRIO are: ? diarrhea ? nausea ? dizziness These are not all the possible side effects of LAGEVRIO. Not many people have taken LAGEVRIO. Serious and unexpected side effects may happen. This medicine is still being studied, so it is possible that all of the risks are not known at this time.  What other treatment  choices are there?  Veklury (remdesivir) is FDA-approved as an intravenous (IV) infusion for the treatment of mildto-moderate RDEYC-14 in certain adults and children. Talk with your doctor to see if Marijean Heath is appropriate for you. Like LAGEVRIO, FDA may also allow for the emergency use of other medicines to treat people with COVID-19. Go to LacrosseProperties.si for more information. It is your choice to be treated or not to be treated with LAGEVRIO. Should you decide not to take it, it will not change your standard medical care.  What if I am breastfeeding? Breastfeeding is not recommended during treatment with LAGEVRIO and for 4 days after the last dose of LAGEVRIO. If you are breastfeeding or plan to breastfeed, talk to your healthcare provider about your  options and specific situation before taking LAGEVRIO.  How do I report side effects with LAGEVRIO? Contact your healthcare provider if you have any side effects that bother you or do not go away. Report side effects to FDA MedWatch at SmoothHits.hu or call 1-800-FDA-1088 (1- 669-801-8532).  How should I store [place]? ? Store LAGEVRIO capsules at room temperature between 49F to 28F (20C to 25C). ? Keep LAGEVRIO and all medicines out of the reach of children and pets. How can I learn more about COVID-19? ? Ask your healthcare provider. ? Visit SeekRooms.co.uk ? Contact your local or state public health department. ? Call [place] at [phone] (toll free in the [place] ? Visit www.molnupiravir.com  What Is an Emergency Use Authorization (EUA)? The [place] FDA has made [place] available under an emergency access mechanism called an Emergency Use Authorization (EUA) The EUA is supported by a Present[place], broadcasting Health and Human Service (HHS) declaration that circumstances exist to justify emergency use of drugs and biological products during the COVID-19 pandemic. LAGEVRIO for the treatment of mild-to-moderate COVID-19 in adults with positive results of direct SARS-CoV-2 viral testing, who are at high risk for progression to severe COVID-19, including hospitalization or death, and for whom alternative COVID-19 treatment options approved or authorized by FDA are not accessible or clinically appropriate, has not undergone the same type of review as an FDA-approved product. In issuing an EUA under the WYOVZ-85 public health emergency, the FDA has determined, among other things, that based on the total amount of scientific evidence available including data from adequate and well-controlled clinical trials, if available, it is reasonable to believe that the product may be effective for diagnosing, treating, or preventing COVID-19, or a serious or life-threatening  disease or condition caused by COVID-19; that the known and potential benefits of the product, when used to diagnose, treat, or prevent such disease or condition, outweigh the known and potential risks of such product; and that there are no adequate, approved, and available alternatives.  All of these criteria must be met to allow for the product to be used in the treatment of patients during the COVID-19 pandemic. The EUA for LAGEVRIO is in effect for the duration of the COVID-19 declaration justifying emergency use of LAGEVRIO, unless terminated or revoked (after which LAGEVRIO may no longer be used under the EUA). For patent information: http://rogers.info/ Copyright  2021-2022 [place] [place], [place] and its affiliates. All rights reserved. [id number] Revised: March 2022

## 2022-08-06 ENCOUNTER — Ambulatory Visit: Payer: BC Managed Care – PPO | Admitting: Obstetrics & Gynecology

## 2022-08-07 ENCOUNTER — Encounter: Payer: Self-pay | Admitting: Podiatry

## 2022-08-07 ENCOUNTER — Other Ambulatory Visit: Payer: Self-pay | Admitting: Internal Medicine

## 2022-08-07 ENCOUNTER — Ambulatory Visit (INDEPENDENT_AMBULATORY_CARE_PROVIDER_SITE_OTHER): Payer: BC Managed Care – PPO | Admitting: Podiatry

## 2022-08-07 DIAGNOSIS — L600 Ingrowing nail: Secondary | ICD-10-CM

## 2022-08-07 DIAGNOSIS — F900 Attention-deficit hyperactivity disorder, predominantly inattentive type: Secondary | ICD-10-CM

## 2022-08-07 NOTE — Patient Instructions (Signed)

## 2022-08-08 NOTE — Progress Notes (Signed)
Subjective:   Patient ID: Misty Benson, female   DOB: 61 y.o.   MRN: [id number]   HPI Patient presents with ingrown toenail right big toe and also damage to that nail that has created discoloration and thickness per 17 years duration with the probability of trauma.  Patient states the ingrown toenail has been sore and patient does not smoke and does not like to be active   Review of Systems  All other systems reviewed and are negative.       Objective:  Physical Exam Vitals and nursing note reviewed.  Constitutional:      Appearance: She is well-developed.  Pulmonary:     Effort: Pulmonary effort is normal.  Musculoskeletal:        General: Normal range of motion.  Skin:    General: Skin is warm.  Neurological:     Mental Status: She is alert.     Neurovascular status intact muscle strength found to be adequate range of motion within normal limits.  Patient is noted to have an incurvated medial border right hallux painful when pressed no redness no drainage and overall structural damage to the nailbed itself with thickness and pain.  Patient has good digital perfusion well oriented x3     Assessment:  Ingrown toenail deformity right hallux medial border with pain with structural damage to the nailbed which is contributory to the overall problem     Plan:  H&P reviewed and at this point we will get a try to just remove the corner permanently but ultimately it may require full nail removal.  I explained this to her and the fact there is trauma to this nailbed and that most likely this is the issue versus fungal and I explained to her the differences.  I allowed her to read consent form for correction of the border explaining risk and patient wants surgery and today I infiltrated the right hallux 60 mg like Marcaine mixture sterile prep done and using sterile instrumentation remove the medial border exposed matrix applied phenol 3 applications 30 seconds followed by alcohol lavage  sterile dressing gave instructions on soaks and to leave dressing on 24 hours but take it off earlier if throbbing were to occur answered all questions

## 2022-08-13 ENCOUNTER — Encounter: Payer: Self-pay | Admitting: Internal Medicine

## 2022-08-13 ENCOUNTER — Ambulatory Visit (INDEPENDENT_AMBULATORY_CARE_PROVIDER_SITE_OTHER): Payer: BC Managed Care – PPO | Admitting: Internal Medicine

## 2022-08-13 VITALS — BP 122/80 | HR 70 | Temp 98.4°F | Resp 18 | Ht 68.0 in | Wt 193.1 lb

## 2022-08-13 DIAGNOSIS — F900 Attention-deficit hyperactivity disorder, predominantly inattentive type: Secondary | ICD-10-CM

## 2022-08-13 DIAGNOSIS — Z23 Encounter for immunization: Secondary | ICD-10-CM

## 2022-08-13 MED ORDER — AMPHETAMINE-DEXTROAMPHETAMINE 20 MG PO TABS: 20.0000 mg | ORAL_TABLET | Freq: Two times a day (BID) | ORAL | 0 refills | Status: DC

## 2022-08-13 NOTE — Progress Notes (Signed)
Established Patient Office Visit     CC/Reason for Visit: Medication refills  HPI: Misty Benson is a 61 y.o. female who is coming in today for the above mentioned reasons. Past Medical History is significant for: ADHD who is due for Adderall refills today, also history of hypertension, hyperlipidemia and depression.  She is requesting flu vaccine today.  Over the summer she had a hip replacement and has recovered well.  She has lost a significant amount of weight as well.   Past Medical/Surgical History: Past Medical History:  Diagnosis Date   ADHD (attention deficit hyperactivity disorder)    Anxiety    Back pain    Cancer ([place])    BASAL CELL FACE AND SHOULDERS , SQUAMOS CELL ON HAND AND MELANOMA LEFT ARM...   Cervix cancer ([place]) 1987   microinvasive   Constipation    Edema of both lower extremities    Hypertension    Joint pain    Leiomyoma 2013   multiple small   Melanoma ([place])    Obesity    Prediabetes    Shoulder pain    Snoring     Past Surgical History:  Procedure Laterality Date   arm surgery     to remove melanoma on left upper outter arm   BUNIONECTOMY     LEFT FOOT WITH PINS   CERVICAL CONE BIOPSY  1987   CESAREAN SECTION     DILATION AND CURETTAGE OF UTERUS     x2   FOOT SURGERY     MELANOMA EXCISION     TONSILLECTOMY     as a child    Social History:  reports that she has never smoked. She has never used smokeless tobacco. She reports current alcohol use of about 4.0 standard drinks of alcohol per week. She reports that she does not use drugs.  Allergies: No Known Allergies  Family History:  Family History  Problem Relation Age of Onset   Stroke Mother    Osteoporosis Mother    Hypertension Mother    Hyperlipidemia Father    Atrial fibrillation Father    Cancer Father        Prostate   Heart defect Father    Breast cancer Maternal Grandmother 91     Current Outpatient Medications:    amphetamine-dextroamphetamine (ADDERALL)  20 MG tablet, Take 1 tablet (20 mg total) by mouth 2 (two) times daily., Disp: 60 tablet, Rfl: 0   amphetamine-dextroamphetamine (ADDERALL) 20 MG tablet, Take 1 tablet (20 mg total) by mouth 2 (two) times daily., Disp: 60 tablet, Rfl: 0   calcium carbonate (OS-CAL) 600 MG TABS tablet, Take 600 mg by mouth 2 (two) times daily with a meal., Disp: , Rfl:    Cholecalciferol (VITAMIN D3) 125 MCG (5000 UT) CAPS, Take 1 cap daily Monday through Friday and 2 caps daily Saturday and Sunday., Disp: 30 capsule, Rfl: 0   citalopram (CELEXA) 20 MG tablet, TAKE 1 TABLET(20 MG) BY MOUTH DAILY, Disp: 90 tablet, Rfl: 0   diclofenac (VOLTAREN) 50 MG EC tablet, Take 50 mg by mouth 2 (two) times daily as needed., Disp: , Rfl:    gabapentin (NEURONTIN) 300 MG capsule, Take 300 mg by mouth at bedtime as needed., Disp: , Rfl:    hydrochlorothiazide (MICROZIDE) 12.5 MG capsule, TAKE 1 CAPSULE(12.5 MG) BY MOUTH DAILY, Disp: 90 capsule, Rfl: 0   ibuprofen (ADVIL,MOTRIN) 200 MG tablet, Take 3-4 tablets (600-800 mg total) by mouth every 6 (six) hours  as needed for fever, headache, mild pain, moderate pain or cramping., Disp: 30 tablet, Rfl: 6   metFORMIN (GLUCOPHAGE) 500 MG tablet, Take 500 mg by mouth 2 (two) times daily with a meal., Disp: , Rfl:    Omega-3 1000 MG CAPS, Take by mouth., Disp: , Rfl:    verapamil (CALAN-SR) 240 MG CR tablet, TAKE 1 TABLET(240 MG) BY MOUTH DAILY, Disp: 90 tablet, Rfl: 1   amphetamine-dextroamphetamine (ADDERALL) 20 MG tablet, Take 1 tablet (20 mg total) by mouth 2 (two) times daily., Disp: 60 tablet, Rfl: 0  Review of Systems:  Constitutional: Denies fever, chills, diaphoresis, appetite change and fatigue.  HEENT: Denies photophobia, eye pain, redness, hearing loss, ear pain, congestion, sore throat, rhinorrhea, sneezing, mouth sores, trouble swallowing, neck pain, neck stiffness and tinnitus.   Respiratory: Denies SOB, DOE, cough, chest tightness,  and wheezing.   Cardiovascular: Denies  chest pain, palpitations and leg swelling.  Gastrointestinal: Denies nausea, vomiting, abdominal pain, diarrhea, constipation, blood in stool and abdominal distention.  Genitourinary: Denies dysuria, urgency, frequency, hematuria, flank pain and difficulty urinating.  Endocrine: Denies: hot or cold intolerance, sweats, changes in hair or nails, polyuria, polydipsia. Musculoskeletal: Denies myalgias, back pain, joint swelling, arthralgias and gait problem.  Skin: Denies pallor, rash and wound.  Neurological: Denies dizziness, seizures, syncope, weakness, light-headedness, numbness and headaches.  Hematological: Denies adenopathy. Easy bruising, personal or family bleeding history  Psychiatric/Behavioral: Denies suicidal ideation, mood changes, confusion, nervousness, sleep disturbance and agitation    Physical Exam: Vitals:   08/13/22 1103  BP: 122/80  Pulse: 70  Resp: 18  Temp: 98.4 F (36.9 C)  TempSrc: Oral  SpO2: 96%  Weight: 193 lb 2 oz (87.6 kg)  Height: '5\' 8"'$  (1.727 m)    Body mass index is 29.36 kg/m.   Constitutional: NAD, calm, comfortable Eyes: PERRL, lids and conjunctivae normal ENMT: Mucous membranes are moist.  Respiratory: clear to auscultation bilaterally, no wheezing, no crackles. Normal respiratory effort. No accessory muscle use.  Cardiovascular: Regular rate and rhythm, no murmurs / rubs / gallops. No extremity edema. Psychiatric: Normal judgment and insight. Alert and oriented x 3. Normal mood.    Impression and Plan:  Attention deficit hyperactivity disorder (ADHD), predominantly inattentive type - Plan: amphetamine-dextroamphetamine (ADDERALL) 20 MG tablet, amphetamine-dextroamphetamine (ADDERALL) 20 MG tablet, amphetamine-dextroamphetamine (ADDERALL) 20 MG tablet  Need for influenza vaccination  -PDMP reviewed, no red flags, overdose risk score is 300, increase in ORS is likely accounting for narcotics received around the time of surgery. -Refill  Adderall 20 mg to take 1 tablet twice daily for total of 60 tablets a month x3 months. -Flu vaccine administered today.  Time spent:23 minutes reviewing chart, interviewing and examining patient and formulating plan of care.     Lelon Frohlich, MD [facility]

## 2022-08-15 ENCOUNTER — Other Ambulatory Visit: Payer: Self-pay | Admitting: Internal Medicine

## 2022-08-15 ENCOUNTER — Encounter (INDEPENDENT_AMBULATORY_CARE_PROVIDER_SITE_OTHER): Payer: Self-pay | Admitting: Family Medicine

## 2022-08-15 ENCOUNTER — Ambulatory Visit (INDEPENDENT_AMBULATORY_CARE_PROVIDER_SITE_OTHER): Payer: BC Managed Care – PPO | Admitting: Family Medicine

## 2022-08-15 VITALS — BP 121/77 | HR 59 | Temp 98.3°F | Ht 68.0 in | Wt 189.0 lb

## 2022-08-15 DIAGNOSIS — Z6828 Body mass index (BMI) 28.0-28.9, adult: Secondary | ICD-10-CM

## 2022-08-15 DIAGNOSIS — E669 Obesity, unspecified: Secondary | ICD-10-CM

## 2022-08-15 DIAGNOSIS — R7303 Prediabetes: Secondary | ICD-10-CM | POA: Diagnosis not present

## 2022-08-15 DIAGNOSIS — I10 Essential (primary) hypertension: Secondary | ICD-10-CM | POA: Diagnosis not present

## 2022-08-15 DIAGNOSIS — E559 Vitamin D deficiency, unspecified: Secondary | ICD-10-CM

## 2022-08-19 ENCOUNTER — Other Ambulatory Visit: Payer: Self-pay | Admitting: Internal Medicine

## 2022-08-19 DIAGNOSIS — F900 Attention-deficit hyperactivity disorder, predominantly inattentive type: Secondary | ICD-10-CM

## 2022-08-19 DIAGNOSIS — M1612 Unilateral primary osteoarthritis, left hip: Secondary | ICD-10-CM | POA: Diagnosis not present

## 2022-08-19 NOTE — Telephone Encounter (Signed)
Other pharmacy does not have in stock please send Refill amphetamine-dextroamphetamine (ADDERALL) 20 MG tablet to  [facility] [address], [place] Phone:  [phone]  Fax:  [phone]

## 2022-08-20 MED ORDER — AMPHETAMINE-DEXTROAMPHETAMINE 20 MG PO TABS: 20.0000 mg | ORAL_TABLET | Freq: Two times a day (BID) | ORAL | 0 refills | Status: DC

## 2022-08-20 NOTE — Telephone Encounter (Signed)
Disregard the last sentence. I thought I had to route a different message to you when I open the refill medication tab. Thank you!

## 2022-08-20 NOTE — Addendum Note (Signed)
Addended by: Encarnacion Slates on: 08/20/2022 04:39 PM   Modules accepted: Orders

## 2022-09-06 ENCOUNTER — Encounter: Payer: Self-pay | Admitting: Obstetrics & Gynecology

## 2022-09-06 ENCOUNTER — Other Ambulatory Visit (HOSPITAL_COMMUNITY)
Admission: RE | Admit: 2022-09-06 | Discharge: 2022-09-06 | Disposition: A | Discharge status: Home / Self Care | Payer: BC Managed Care – PPO | Source: Ambulatory Visit | Attending: Obstetrics & Gynecology | Admitting: Obstetrics & Gynecology

## 2022-09-06 ENCOUNTER — Ambulatory Visit (INDEPENDENT_AMBULATORY_CARE_PROVIDER_SITE_OTHER): Payer: BC Managed Care – PPO | Admitting: Obstetrics & Gynecology

## 2022-09-06 VITALS — BP 118/80 | HR 82 | Ht 67.75 in | Wt 192.0 lb

## 2022-09-06 DIAGNOSIS — D061 Carcinoma in situ of exocervix: Secondary | ICD-10-CM | POA: Diagnosis not present

## 2022-09-06 DIAGNOSIS — Z01419 Encounter for gynecological examination (general) (routine) without abnormal findings: Secondary | ICD-10-CM | POA: Diagnosis not present

## 2022-09-06 DIAGNOSIS — Z780 Asymptomatic menopausal state: Secondary | ICD-10-CM

## 2022-09-06 DIAGNOSIS — Z13820 Encounter for screening for osteoporosis: Secondary | ICD-10-CM | POA: Diagnosis not present

## 2022-09-06 NOTE — Progress Notes (Signed)
Misty Benson [birth year] [id number]   History:    61 y.o.  O2H4T6L4 Married.  Husband is an Forensic psychologist.  Stay at home mom.  2 daughters: 89 yo in [place], [place] and 62 yo in [place].  Son is 50 yo at [employer].   RP:  Established patient presenting for annual gyn exam    HPI: Postmenopausal.  No menopausal symptoms.  No vaginal bleeding. Mild cystocele and rectocele which are asymptomatic.  Very mild SUI with coughing. Pap smear Neg 05/2021. Pap reflex today. 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  Pap smears have been normal since then. Breasts normal.  Mammo Neg 03/2022.  Colono benign Polyps 11/2020.  BMI decreased to 29.41.  Successful with [facility].  Health labs there and with Fam MD.  Will schedule a BD here now.   Past medical history,surgical history, family history and social history were all reviewed and documented in the EPIC chart.  Gynecologic History Patient's last menstrual period was 05/30/2012.  Obstetric History OB History  Gravida Para Term Preterm AB Living  '7 3     4 3  '$ SAB IAB Ectopic Multiple Live Births  4       3    # Outcome Date GA Lbr Len/2nd Weight Sex Delivery Anes PTL Lv  7 SAB           6 SAB           5 SAB           4 SAB           3 Para     M CS-Unspec   LIV  2 Para     F Vag-Spont   LIV  1 Para     F Vag-Spont   LIV     ROS: A ROS was performed and pertinent positives and negatives are included in the history. GENERAL: No fevers or chills. HEENT: No change in vision, no earache, sore throat or sinus congestion. NECK: No pain or stiffness. CARDIOVASCULAR: No chest pain or pressure. No palpitations. PULMONARY: No shortness of breath, cough or wheeze. GASTROINTESTINAL: No abdominal pain, nausea, vomiting or diarrhea, melena or bright red blood per rectum. GENITOURINARY: No urinary frequency, urgency, hesitancy or dysuria. MUSCULOSKELETAL: No joint or muscle pain, no back pain,  no recent trauma. DERMATOLOGIC: No rash, no itching, no lesions. ENDOCRINE: No polyuria, polydipsia, no heat or cold intolerance. No recent change in weight. HEMATOLOGICAL: No anemia or easy bruising or bleeding. NEUROLOGIC: No headache, seizures, numbness, tingling or weakness. PSYCHIATRIC: No depression, no loss of interest in normal activity or change in sleep pattern.     Exam:   Ht 5' 7.75" (1.721 m)   Wt 192 lb (87.1 kg)   LMP 05/30/2012 Comment: husband vasectomy  BMI 29.41 kg/m   Body mass index is 29.41 kg/m.  General appearance : Well developed well nourished female. No acute distress HEENT: Eyes: no retinal hemorrhage or exudates,  Neck supple, trachea midline, no carotid bruits, no thyroidmegaly Lungs: Clear to auscultation, no rhonchi or wheezes, or rib retractions  Heart: Regular rate and rhythm, no murmurs or gallops Breast:Examined in sitting and supine position were symmetrical in appearance, no palpable masses or tenderness,  no skin retraction, no nipple inversion, no nipple discharge, no skin discoloration, no axillary or supraclavicular lymphadenopathy Abdomen: no palpable masses or tenderness, no rebound or guarding Extremities: no edema or skin discoloration  or tenderness  Pelvic: Vulva: Normal             Vagina: No gross lesions or discharge  Cervix: No gross lesions or discharge.  Pap reflex done.  Uterus  AV, normal size, shape and consistency, non-tender and mobile  Adnexa  Without masses or tenderness  Anus: Normal   Assessment/Plan:  61 y.o. female for annual exam   1. Encounter for routine gynecological examination with Papanicolaou smear of cervix Postmenopausal.  No menopausal symptoms.  No vaginal bleeding. Mild cystocele and rectocele which are asymptomatic.  Very mild SUI with coughing. Pap smear Neg 05/2021. Pap reflex today. 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  Pap smears have been normal since then.  Breasts normal.  Mammo Neg 03/2022.  Colono benign Polyps 11/2020.  BMI decreased to 29.41.  Successful with [facility].  Health labs there and with Fam MD.  Will schedule a BD here now. - Cytology - PAP( [place])  2. Carcinoma in situ of exocervix - Cytology - PAP( [place])  3. Postmenopause Postmenopausal.  No menopausal symptoms.  No vaginal bleeding. Vit D, Vit K2 100 microgram daily, Ca++ total 1.5 g/d.  Weight bearing physical activities.  Schedule a BD here now. - DG Bone Density; Future  4. Screening for osteoporosis Postmenopausal.  No menopausal symptoms.  No vaginal bleeding. Vit D, Vit K2 100 microgram daily, Ca++ total 1.5 g/d.  Weight bearing physical activities.  Schedule a BD here now. - DG Bone Density; Future  Other orders - Multiple Vitamin (MULTIVITAMIN PO); Take by mouth.   Princess Bruins MD, 12:00 PM 09/06/2022

## 2022-09-08 ENCOUNTER — Other Ambulatory Visit (INDEPENDENT_AMBULATORY_CARE_PROVIDER_SITE_OTHER): Payer: Self-pay | Admitting: Family Medicine

## 2022-09-08 MED ORDER — METFORMIN HCL 500 MG PO TABS: 500.0000 mg | ORAL_TABLET | Freq: Two times a day (BID) | ORAL | 0 refills | Status: DC

## 2022-09-09 DIAGNOSIS — L570 Actinic keratosis: Secondary | ICD-10-CM | POA: Diagnosis not present

## 2022-09-09 DIAGNOSIS — D2261 Melanocytic nevi of right upper limb, including shoulder: Secondary | ICD-10-CM | POA: Diagnosis not present

## 2022-09-09 DIAGNOSIS — D0372 Melanoma in situ of left lower limb, including hip: Secondary | ICD-10-CM | POA: Diagnosis not present

## 2022-09-09 DIAGNOSIS — D2271 Melanocytic nevi of right lower limb, including hip: Secondary | ICD-10-CM | POA: Diagnosis not present

## 2022-09-09 DIAGNOSIS — D225 Melanocytic nevi of trunk: Secondary | ICD-10-CM | POA: Diagnosis not present

## 2022-09-09 DIAGNOSIS — D2262 Melanocytic nevi of left upper limb, including shoulder: Secondary | ICD-10-CM | POA: Diagnosis not present

## 2022-09-10 LAB — CYTOLOGY - PAP: Diagnosis: NEGATIVE

## 2022-09-11 ENCOUNTER — Encounter (INDEPENDENT_AMBULATORY_CARE_PROVIDER_SITE_OTHER): Payer: Self-pay | Admitting: Family Medicine

## 2022-09-11 ENCOUNTER — Ambulatory Visit (INDEPENDENT_AMBULATORY_CARE_PROVIDER_SITE_OTHER): Payer: BC Managed Care – PPO | Admitting: Family Medicine

## 2022-09-11 VITALS — BP 126/83 | HR 76 | Temp 98.4°F | Ht 68.0 in | Wt 191.0 lb

## 2022-09-11 DIAGNOSIS — Z6829 Body mass index (BMI) 29.0-29.9, adult: Secondary | ICD-10-CM

## 2022-09-11 DIAGNOSIS — R7303 Prediabetes: Secondary | ICD-10-CM | POA: Diagnosis not present

## 2022-09-11 DIAGNOSIS — E669 Obesity, unspecified: Secondary | ICD-10-CM

## 2022-09-11 DIAGNOSIS — Z96649 Presence of unspecified artificial hip joint: Secondary | ICD-10-CM

## 2022-09-17 NOTE — Progress Notes (Signed)
Chief Complaint:   OBESITY Misty Benson is here to discuss her progress with her obesity treatment plan along with follow-up of her obesity related diagnoses. Misty Benson is on  Category 2 Plan and keeping a food journal and adhering to recommended goals of 400-550 calories and 40+ grams of protein at supper daily and states she is following her eating plan approximately 80% of the time. Breuna states she is walking more.   Today's visit was #: 22 Starting weight: 249 lbs Starting date: 04/05/2021 Today's weight: 189 lbs Today's date: 08/15/2022 Total lbs lost to date: 60 lbs Total lbs lost since last in-office visit: 3 lbs  Interim History: Patient of Dr Saul Fordyce.  1st office visit with me.  She cut out snacks and carbohydrates and states this is why she did well with her weight loss.  Less skipping meals.  Meal planning is better.  At the end of July she got left THA and did PT for 4 weeks, twice weekly. Her activity is limited.  Subjective:   1. Pre-diabetes At the last office visit, Dr Leafy Ro instructed patient to increase her metformin to BID.  No issues with medications and denies any side effects.  Better control of cravings and hunger with changes to diet.   2. Essential hypertension Patient is asymptomatic no concerns today. Serum creatinine 0.7 on 05/30/2022, stable.  Blood pressure at goal today.  Patient is taking HCTZ and Verapamil. Review: taking medications as instructed, no medication side effects noted, no chest pain on exertion, no dyspnea on exertion, no swelling of ankles.   3. Vitamin D deficiency 05/30/2022, Vitamin D 70.7.  Patient is asymptomatic, no concerns.  She is taking OTC Vitamin D 5,000 IU Monday-Friday and 10,000 IU Saturday and Sunday.   Assessment/Plan:  No orders of the defined types were placed in this encounter.   Medications Discontinued During This Encounter  Medication Reason   calcium carbonate (OS-CAL) 600 MG TABS tablet    metFORMIN (GLUCOPHAGE)  500 MG tablet Reorder     Meds ordered this encounter  Medications   metFORMIN (GLUCOPHAGE) 500 MG tablet    Sig: Take 1 tablet (500 mg total) by mouth 2 (two) times daily with a meal.    Dispense:  60 tablet    Refill:  0     1. Pre-diabetes Last A1c, 5.8, 04/05/2021.  <3 months ago at 5.5, good.  Fasting insulin 5.1.  Continue PNP  and weight loss.   Refill - metFORMIN (GLUCOPHAGE) 500 MG tablet; Take 1 tablet (500 mg total) by mouth 2 (two) times daily with a meal.  Dispense: 60 tablet; Refill: 0  2. Essential hypertension Blood pressure at goal today.  Continue medications per PCP.  Patient will monitor decreased salt intake, continue with PNP and weight loss.   3. Vitamin D deficiency Continue OTC Vitamin D for now.  Was not changed after last labs.  But I recommend that she has her Vitamin D level rechecked at the next office visit to see if dose needs to be adjusted.   4. Obesity, Current BMI 28.8 Needs Vitamin D level rechecked, non-fasting labs.   Misty Benson is currently in the action stage of change. As such, her goal is to continue with weight loss efforts. She has agreed to Category 2 Plan and keeping a food journal and adhering to recommended goals of 400-550 calories and 40+ grams of protein at supper daily.   Exercise goals: All adults should avoid inactivity. Some physical activity  is better than none, and adults who participate in any amount of physical activity gain some health benefits.  Exercise as tolerated with hip replacement per ortho and PT.  Behavioral modification strategies: better snacking choices, avoiding temptations, and planning for success.  [name] has agreed to follow-up with our clinic in 3 weeks. She was informed of the importance of frequent follow-up visits to maximize her success with intensive lifestyle modifications for her multiple health conditions.   Objective:   Blood pressure 121/77, pulse (!) 59, temperature 98.3 F (36.8 C), height '5\' 8"'$   (1.727 m), weight 189 lb (85.7 kg), last menstrual period 05/30/2012, SpO2 97 %. Body mass index is 28.74 kg/m.  General: Cooperative, alert, well developed, in no acute distress. HEENT: Conjunctivae and lids unremarkable. Cardiovascular: Regular rhythm.  Lungs: Normal work of breathing. Neurologic: No focal deficits.   Lab Results  Component Value Date   CREATININE 0.70 05/30/2022   BUN 30 (H) 05/30/2022   NA 144 05/30/2022   K 4.4 05/30/2022   CL 101 05/30/2022   CO2 24 05/30/2022   Lab Results  Component Value Date   ALT 25 05/30/2022   AST 17 05/30/2022   ALKPHOS 59 05/30/2022   BILITOT 0.3 05/30/2022   Lab Results  Component Value Date   HGBA1C 5.5 05/30/2022   HGBA1C 5.4 10/02/2021   HGBA1C 5.8 (H) 04/05/2021   Lab Results  Component Value Date   INSULIN 5.1 05/30/2022   INSULIN 9.5 04/05/2021   Lab Results  Component Value Date   TSH 2.220 04/05/2021   Lab Results  Component Value Date   CHOL 194 05/30/2022   HDL 85 05/30/2022   LDLCALC 96 05/30/2022   TRIG 70 05/30/2022   CHOLHDL 3.4 02/23/2020   Lab Results  Component Value Date   VD25OH 70.7 05/30/2022   VD25OH 41.1 04/05/2021   VD25OH 41 01/21/2019   Lab Results  Component Value Date   WBC 5.0 05/30/2022   HGB 13.8 05/30/2022   HCT 41.5 05/30/2022   MCV 92 05/30/2022   PLT 262 05/30/2022   No results found for: "IRON", "TIBC", "FERRITIN"  Attestation Statements:   Reviewed by clinician on day of visit: allergies, medications, problem list, medical history, surgical history, family history, social history, and previous encounter notes.  I, Davy Pique, RMA, am acting as Location manager for [employer], DO.   I have reviewed the above documentation for accuracy and completeness, and I agree with the above. Marjory Sneddon, D.O.  The [place] was signed into law in 2016 which includes the topic of electronic health records.  This provides immediate access to  information in MyChart.  This includes consultation notes, operative notes, office notes, lab results and pathology reports.  If you have any questions about what you read please let us know at your next visit so we can discuss your concerns and take corrective action if need be.  We are right here with you.

## 2022-09-23 NOTE — Progress Notes (Unsigned)
Chief Complaint:   OBESITY Misty Benson is here to discuss her progress with her obesity treatment plan along with follow-up of her obesity related diagnoses. Misty Benson is on the Category 2 Plan and keeping a food journal and adhering to recommended goals of 400-550 calories and 40+ grams of protein at supper daily and states she is following her eating plan approximately 65% of the time. Misty Benson states she is walking for 15 minutes 5 times per week.  Today's visit was #: 23 Starting weight: 249 lbs Starting date: 04/05/2021 Today's weight: 191 lbs Today's date: 09/11/2022 Total lbs lost to date: 58 Total lbs lost since last in-office visit: 0  Interim History: Misty Benson was very stressed over the past several weeks and she traveled as well.  She is working to get back on track.  She reports some increase in simple carbohydrates in the evenings.  Subjective:   1. Pre-diabetes Misty Benson is taking metformin 500 mg twice daily, but not consistently.  She reports no side effects.  She has been snacking on simple carbohydrates in the evenings.  2. Status post hip replacement, unspecified laterality Misty Benson is still taking gabapentin, but she reports she has recovered well.  She has completed her course of physical therapy.  Assessment/Plan:   1. Pre-diabetes We discussed consistently taking metformin at breakfast and lunch will help with weight loss and decrease her hunger. Misty Benson will continue with her eating plan and exercise.  2. Status post hip replacement, unspecified laterality Misty Benson will continue her eating plan and exercise as she is able.  3. Obesity, Current BMI 29.1 Misty Benson is currently in the action stage of change. As such, her goal is to continue with weight loss efforts. She has agreed to the Category 2 Plan.   Exercise goals: As is.   Behavioral modification strategies: increasing lean protein intake, decreasing simple carbohydrates, no skipping meals, better snacking choices, and planning  for success.  Misty Benson has agreed to follow-up with our clinic in 3 to 4 weeks. She was informed of the importance of frequent follow-up visits to maximize her success with intensive lifestyle modifications for her multiple health conditions.   Objective:   Blood pressure 126/83, pulse 76, temperature 98.4 F (36.9 C), height '5\' 8"'$  (1.727 m), weight 191 lb (86.6 kg), last menstrual period 05/30/2012, SpO2 98 %. Body mass index is 29.04 kg/m.  General: Cooperative, alert, well developed, in no acute distress. HEENT: Conjunctivae and lids unremarkable. Cardiovascular: Regular rhythm.  Lungs: Normal work of breathing. Neurologic: No focal deficits.   Lab Results  Component Value Date   CREATININE 0.70 05/30/2022   BUN 30 (H) 05/30/2022   NA 144 05/30/2022   K 4.4 05/30/2022   CL 101 05/30/2022   CO2 24 05/30/2022   Lab Results  Component Value Date   ALT 25 05/30/2022   AST 17 05/30/2022   ALKPHOS 59 05/30/2022   BILITOT 0.3 05/30/2022   Lab Results  Component Value Date   HGBA1C 5.5 05/30/2022   HGBA1C 5.4 10/02/2021   HGBA1C 5.8 (H) 04/05/2021   Lab Results  Component Value Date   INSULIN 5.1 05/30/2022   INSULIN 9.5 04/05/2021   Lab Results  Component Value Date   TSH 2.220 04/05/2021   Lab Results  Component Value Date   CHOL 194 05/30/2022   HDL 85 05/30/2022   LDLCALC 96 05/30/2022   TRIG 70 05/30/2022   CHOLHDL 3.4 02/23/2020   Lab Results  Component Value Date   VD25OH  70.7 05/30/2022   VD25OH 41.1 04/05/2021   VD25OH 41 01/21/2019   Lab Results  Component Value Date   WBC 5.0 05/30/2022   HGB 13.8 05/30/2022   HCT 41.5 05/30/2022   MCV 92 05/30/2022   PLT 262 05/30/2022   No results found for: "IRON", "TIBC", "FERRITIN"  Attestation Statements:   Reviewed by clinician on day of visit: allergies, medications, problem list, medical history, surgical history, family history, social history, and previous encounter notes.  I have personally  spent 40 minutes total time today in preparation, patient care, and documentation for this visit, including the following: review of clinical lab tests; review of medical tests/procedures/services.   I, Trixie Dredge, am acting as transcriptionist for Dennard Nip, MD.  I have reviewed the above documentation for accuracy and completeness, and I agree with the above. -  Dennard Nip, MD

## 2022-09-25 DIAGNOSIS — L988 Other specified disorders of the skin and subcutaneous tissue: Secondary | ICD-10-CM | POA: Diagnosis not present

## 2022-09-25 DIAGNOSIS — D0372 Melanoma in situ of left lower limb, including hip: Secondary | ICD-10-CM | POA: Diagnosis not present

## 2022-09-26 DIAGNOSIS — H9313 Tinnitus, bilateral: Secondary | ICD-10-CM | POA: Diagnosis not present

## 2022-09-26 DIAGNOSIS — H6123 Impacted cerumen, bilateral: Secondary | ICD-10-CM | POA: Diagnosis not present

## 2022-09-26 DIAGNOSIS — H903 Sensorineural hearing loss, bilateral: Secondary | ICD-10-CM | POA: Diagnosis not present

## 2022-09-30 ENCOUNTER — Encounter: Payer: Self-pay | Admitting: Podiatry

## 2022-09-30 ENCOUNTER — Telehealth: Payer: Self-pay | Admitting: *Deleted

## 2022-09-30 NOTE — Telephone Encounter (Addendum)
Patient is still having problems with post procedure toe, not healing as it should, still puffy and red, sore to the touch around the border,has been taking a strong antibiotic (cephalexin)giving for 6 days after a leg procedure on 11/15,using mupirocin antibiotic ointment on the toe, but does not seem to help.  She will start to soak in epsom salts /water again to see if this helps, please advise,upcoming appointment on Monday.

## 2022-10-07 ENCOUNTER — Ambulatory Visit: Payer: BC Managed Care – PPO | Admitting: Podiatry

## 2022-10-09 ENCOUNTER — Encounter (INDEPENDENT_AMBULATORY_CARE_PROVIDER_SITE_OTHER): Payer: Self-pay | Admitting: Family Medicine

## 2022-10-09 ENCOUNTER — Ambulatory Visit (INDEPENDENT_AMBULATORY_CARE_PROVIDER_SITE_OTHER): Payer: BC Managed Care – PPO | Admitting: Family Medicine

## 2022-10-09 ENCOUNTER — Other Ambulatory Visit (INDEPENDENT_AMBULATORY_CARE_PROVIDER_SITE_OTHER): Payer: Self-pay | Admitting: Family Medicine

## 2022-10-09 VITALS — BP 122/70 | HR 67 | Temp 98.2°F | Ht 68.0 in | Wt 192.0 lb

## 2022-10-09 DIAGNOSIS — Z6829 Body mass index (BMI) 29.0-29.9, adult: Secondary | ICD-10-CM | POA: Diagnosis not present

## 2022-10-09 DIAGNOSIS — R7303 Prediabetes: Secondary | ICD-10-CM

## 2022-10-09 DIAGNOSIS — E669 Obesity, unspecified: Secondary | ICD-10-CM

## 2022-10-09 DIAGNOSIS — E559 Vitamin D deficiency, unspecified: Secondary | ICD-10-CM

## 2022-10-09 MED ORDER — METFORMIN HCL 500 MG PO TABS: 500.0000 mg | ORAL_TABLET | Freq: Two times a day (BID) | ORAL | 0 refills | Status: DC

## 2022-10-21 NOTE — Progress Notes (Unsigned)
Chief Complaint:   OBESITY Misty Benson is here to discuss her progress with her obesity treatment plan along with follow-up of her obesity related diagnoses. Misty Benson is on the Category 2 Plan and states she is following her eating plan approximately 70% of the time. Misty Benson states she is active while doing yard work for up to 60 minutes 2-3 times per week.  Today's visit was #: 24 Starting weight: 249 lbs Starting date: 04/05/2021 Today's weight: 192 lbs Today's date: 10/09/2022 Total lbs lost to date: 57 Total lbs lost since last in-office visit: 0  Interim History: Misty Benson did well with maintaining her weight over Thanksgiving. She is up approximately 1 lb in water weight. She has done well with increasing protein and being mindful of her choices.   Subjective:   1. Pre-diabetes Misty Benson is stable on metformin, and she denies nausea, vomiting, or polyphagia.   2. Vitamin D deficiency Misty Benson is doing well with OTC Vitamin D, and weight loss also helps with Vitamin D absorption. No side effects were noted.   Assessment/Plan:   1. Pre-diabetes We will refill metformin for 1 month. Misty Benson will continue to work on weight loss, exercise, and decreasing simple carbohydrates to help decrease the risk of diabetes.   - metFORMIN (GLUCOPHAGE) 500 MG tablet; Take 1 tablet (500 mg total) by mouth 2 (two) times daily with a meal.  Dispense: 60 tablet; Refill: 0  2. Vitamin D deficiency Misty Benson will continue OTC Vitamin D as is, and we will recheck labs in 2 months.   3. Obesity, Current BMI 29.2 Misty Benson is currently in the action stage of change. As such, her goal is to continue with weight loss efforts. She has agreed to the Category 2 Plan.   Exercise goals: As is.   Behavioral modification strategies: increasing lean protein intake, decreasing simple carbohydrates, and holiday eating strategies .  Misty Benson has agreed to follow-up with our clinic in 3 weeks. She was informed of the importance of  frequent follow-up visits to maximize her success with intensive lifestyle modifications for her multiple health conditions.   Objective:   Blood pressure 122/70, pulse 67, temperature 98.2 F (36.8 C), height '5\' 8"'$  (1.727 m), weight 192 lb (87.1 kg), last menstrual period 05/30/2012, SpO2 98 %. Body mass index is 29.19 kg/m.  General: Cooperative, alert, well developed, in no acute distress. HEENT: Conjunctivae and lids unremarkable. Cardiovascular: Regular rhythm.  Lungs: Normal work of breathing. Neurologic: No focal deficits.   Lab Results  Component Value Date   CREATININE 0.70 05/30/2022   BUN 30 (H) 05/30/2022   NA 144 05/30/2022   K 4.4 05/30/2022   CL 101 05/30/2022   CO2 24 05/30/2022   Lab Results  Component Value Date   ALT 25 05/30/2022   AST 17 05/30/2022   ALKPHOS 59 05/30/2022   BILITOT 0.3 05/30/2022   Lab Results  Component Value Date   HGBA1C 5.5 05/30/2022   HGBA1C 5.4 10/02/2021   HGBA1C 5.8 (H) 04/05/2021   Lab Results  Component Value Date   INSULIN 5.1 05/30/2022   INSULIN 9.5 04/05/2021   Lab Results  Component Value Date   TSH 2.220 04/05/2021   Lab Results  Component Value Date   CHOL 194 05/30/2022   HDL 85 05/30/2022   LDLCALC 96 05/30/2022   TRIG 70 05/30/2022   CHOLHDL 3.4 02/23/2020   Lab Results  Component Value Date   VD25OH 70.7 05/30/2022   VD25OH 41.1 04/05/2021  VD25OH 41 01/21/2019   Lab Results  Component Value Date   WBC 5.0 05/30/2022   HGB 13.8 05/30/2022   HCT 41.5 05/30/2022   MCV 92 05/30/2022   PLT 262 05/30/2022   No results found for: "IRON", "TIBC", "FERRITIN"  Attestation Statements:   Reviewed by clinician on day of visit: allergies, medications, problem list, medical history, surgical history, family history, social history, and previous encounter notes.   I, Trixie Dredge, am acting as transcriptionist for Dennard Nip, MD.  I have reviewed the above documentation for accuracy and  completeness, and I agree with the above. -  Dennard Nip, MD

## 2022-10-23 ENCOUNTER — Ambulatory Visit: Payer: BC Managed Care – PPO | Admitting: Podiatry

## 2022-10-23 DIAGNOSIS — M898X7 Other specified disorders of bone, ankle and foot: Secondary | ICD-10-CM | POA: Diagnosis not present

## 2022-10-30 ENCOUNTER — Ambulatory Visit (INDEPENDENT_AMBULATORY_CARE_PROVIDER_SITE_OTHER): Payer: BC Managed Care – PPO | Admitting: Physician Assistant

## 2022-10-31 ENCOUNTER — Other Ambulatory Visit: Payer: Self-pay | Admitting: Internal Medicine

## 2022-10-31 DIAGNOSIS — F900 Attention-deficit hyperactivity disorder, predominantly inattentive type: Secondary | ICD-10-CM

## 2022-10-31 DIAGNOSIS — I10 Essential (primary) hypertension: Secondary | ICD-10-CM

## 2022-11-26 ENCOUNTER — Encounter (INDEPENDENT_AMBULATORY_CARE_PROVIDER_SITE_OTHER): Payer: Self-pay | Admitting: Family Medicine

## 2022-11-26 ENCOUNTER — Ambulatory Visit (INDEPENDENT_AMBULATORY_CARE_PROVIDER_SITE_OTHER): Payer: BC Managed Care – PPO | Admitting: Family Medicine

## 2022-11-26 ENCOUNTER — Other Ambulatory Visit (INDEPENDENT_AMBULATORY_CARE_PROVIDER_SITE_OTHER): Payer: Self-pay | Admitting: Family Medicine

## 2022-11-26 VITALS — BP 134/53 | HR 76 | Temp 98.0°F | Ht 68.0 in | Wt 193.0 lb

## 2022-11-26 DIAGNOSIS — E66812 Obesity, class 2: Secondary | ICD-10-CM

## 2022-11-26 DIAGNOSIS — R7303 Prediabetes: Secondary | ICD-10-CM

## 2022-11-26 DIAGNOSIS — Z6829 Body mass index (BMI) 29.0-29.9, adult: Secondary | ICD-10-CM

## 2022-11-26 DIAGNOSIS — E669 Obesity, unspecified: Secondary | ICD-10-CM | POA: Diagnosis not present

## 2022-11-26 MED ORDER — METFORMIN HCL 500 MG PO TABS: 500.0000 mg | ORAL_TABLET | Freq: Two times a day (BID) | ORAL | 0 refills | Status: DC

## 2022-11-27 DIAGNOSIS — M898X7 Other specified disorders of bone, ankle and foot: Secondary | ICD-10-CM | POA: Diagnosis not present

## 2022-11-28 ENCOUNTER — Other Ambulatory Visit: Payer: Self-pay | Admitting: Internal Medicine

## 2022-11-28 DIAGNOSIS — F900 Attention-deficit hyperactivity disorder, predominantly inattentive type: Secondary | ICD-10-CM

## 2022-11-28 MED ORDER — AMPHETAMINE-DEXTROAMPHETAMINE 20 MG PO TABS: 20.0000 mg | ORAL_TABLET | Freq: Two times a day (BID) | ORAL | 0 refills | Status: DC

## 2022-11-28 NOTE — Telephone Encounter (Signed)
Prescription Request  11/28/2022  Is this a "Controlled Substance" medicine? Yes  LOV: 08/13/2022  What is the name of the medication or equipment? amphetamine-dextroamphetamine (ADDERALL) 20 MG tablet   Have you contacted your pharmacy to request a refill? No   Which pharmacy would you like this sent to?  [facility] [address] [place] [address] Phone: [phone] Fax: [phone] Patient notified that their request is being sent to the clinical staff for review and that they should receive a response within 2 business days.   Please advise at Mobile [phone] (mobile)

## 2022-12-01 ENCOUNTER — Other Ambulatory Visit: Payer: Self-pay | Admitting: Internal Medicine

## 2022-12-01 DIAGNOSIS — I10 Essential (primary) hypertension: Secondary | ICD-10-CM

## 2022-12-03 NOTE — Progress Notes (Unsigned)
Chief Complaint:   OBESITY Shalisha is here to discuss her progress with her obesity treatment plan along with follow-up of her obesity related diagnoses. Krista is on the Category 2 Plan and states she is following her eating plan approximately 50% of the time. Rozanne states she is doing 0 minutes 0 times per week.  Today's visit was #: 25 Starting weight: 249 lbs Starting date: 04/05/2021 Today's weight: 193 lbs Today's date: 11/26/2022 Total lbs lost to date: 56 Total lbs lost since last in-office visit: 0  Interim History: Lisl did very well with minimizing holiday weight gain, despite doing a lot of traveling.  She is ready to discuss other eating plans.  Subjective:   1. Pre-diabetes Shaden is stable on metformin.  She did some celebration eating over the holidays and she is open to changing to the low carbohydrate plan for a few weeks.  Assessment/Plan:   1. Pre-diabetes Hellena will continue metformin, and we will refill for 1 month.  She was changed to the low carbohydrate eating plan.  - metFORMIN (GLUCOPHAGE) 500 MG tablet; Take 1 tablet (500 mg total) by mouth 2 (two) times daily with a meal.  Dispense: 60 tablet; Refill: 0  2. Obesity, Current BMI 29.4 Iasia is currently in the action stage of change. As such, her goal is to continue with weight loss efforts. She has agreed to change to following a lower carbohydrate, vegetable and lean protein rich diet plan.   Behavioral modification strategies: increasing water intake and travel eating strategies.  Valinda has agreed to follow-up with our clinic in 4 weeks. She was informed of the importance of frequent follow-up visits to maximize her success with intensive lifestyle modifications for her multiple health conditions.   Objective:   Blood pressure (!) 134/53, pulse 76, temperature 98 F (36.7 C), height '5\' 8"'$  (1.727 m), weight 193 lb (87.5 kg), last menstrual period 05/30/2012, SpO2 97 %. Body mass index is 29.35  kg/m.  General: Cooperative, alert, well developed, in no acute distress. HEENT: Conjunctivae and lids unremarkable. Cardiovascular: Regular rhythm.  Lungs: Normal work of breathing. Neurologic: No focal deficits.   Lab Results  Component Value Date   CREATININE 0.70 05/30/2022   BUN 30 (H) 05/30/2022   NA 144 05/30/2022   K 4.4 05/30/2022   CL 101 05/30/2022   CO2 24 05/30/2022   Lab Results  Component Value Date   ALT 25 05/30/2022   AST 17 05/30/2022   ALKPHOS 59 05/30/2022   BILITOT 0.3 05/30/2022   Lab Results  Component Value Date   HGBA1C 5.5 05/30/2022   HGBA1C 5.4 10/02/2021   HGBA1C 5.8 (H) 04/05/2021   Lab Results  Component Value Date   INSULIN 5.1 05/30/2022   INSULIN 9.5 04/05/2021   Lab Results  Component Value Date   TSH 2.220 04/05/2021   Lab Results  Component Value Date   CHOL 194 05/30/2022   HDL 85 05/30/2022   LDLCALC 96 05/30/2022   TRIG 70 05/30/2022   CHOLHDL 3.4 02/23/2020   Lab Results  Component Value Date   VD25OH 70.7 05/30/2022   VD25OH 41.1 04/05/2021   VD25OH 41 01/21/2019   Lab Results  Component Value Date   WBC 5.0 05/30/2022   HGB 13.8 05/30/2022   HCT 41.5 05/30/2022   MCV 92 05/30/2022   PLT 262 05/30/2022   No results found for: "IRON", "TIBC", "FERRITIN"  Attestation Statements:   Reviewed by clinician on day of visit:  allergies, medications, problem list, medical history, surgical history, family history, social history, and previous encounter notes.  Time spent on visit including pre-visit chart review and post-visit care and charting was 30 minutes.   I, Trixie Dredge, am acting as transcriptionist for Dennard Nip, MD.  I have reviewed the above documentation for accuracy and completeness, and I agree with the above. -  Dennard Nip, MD

## 2022-12-24 ENCOUNTER — Other Ambulatory Visit (INDEPENDENT_AMBULATORY_CARE_PROVIDER_SITE_OTHER): Payer: Self-pay | Admitting: Family Medicine

## 2022-12-24 ENCOUNTER — Encounter (INDEPENDENT_AMBULATORY_CARE_PROVIDER_SITE_OTHER): Payer: Self-pay | Admitting: Family Medicine

## 2022-12-24 ENCOUNTER — Ambulatory Visit (INDEPENDENT_AMBULATORY_CARE_PROVIDER_SITE_OTHER): Payer: BC Managed Care – PPO | Admitting: Family Medicine

## 2022-12-24 VITALS — BP 128/71 | HR 75 | Temp 98.0°F | Ht 68.0 in | Wt 194.0 lb

## 2022-12-24 DIAGNOSIS — R7303 Prediabetes: Secondary | ICD-10-CM

## 2022-12-24 DIAGNOSIS — Z6829 Body mass index (BMI) 29.0-29.9, adult: Secondary | ICD-10-CM

## 2022-12-24 DIAGNOSIS — E669 Obesity, unspecified: Secondary | ICD-10-CM

## 2022-12-24 MED ORDER — METFORMIN HCL 500 MG PO TABS: 500.0000 mg | ORAL_TABLET | Freq: Two times a day (BID) | ORAL | 0 refills | Status: DC

## 2023-01-07 NOTE — Progress Notes (Unsigned)
Chief Complaint:   OBESITY Misty Benson is here to discuss her progress with her obesity treatment plan along with follow-up of her obesity related diagnoses. Misty Benson is on following a lower carbohydrate, vegetable and lean protein rich diet plan and states she is following her eating plan approximately 60% of the time. Misty Benson states she is doing yard work.   Today's visit was #: 54 Starting weight: 249 lbs Starting date: 04/05/2021 Today's weight: 194 lbs Today's date: 12/24/2022 Total lbs lost to date: 55 Total lbs lost since last in-office visit: 0  Interim History: Misty Benson tried to follow our low carbohydrate plan for about 8 days, but she had a headache everyday despite drinking a lot of water. Misty Benson had some water weight fluctuates and this was very discouraging.   Subjective:   1. Pre-diabetes Misty Benson continues to do well with decreasing simple carbohydrates in her diet. She is trying to increase her protein.   Assessment/Plan:   1. Pre-diabetes We will refill metformin for 1 month, and will continue to work on her diet to help prevent diabetes mellitus.   - metFORMIN (GLUCOPHAGE) 500 MG tablet; Take 1 tablet (500 mg total) by mouth 2 (two) times daily with a meal.  Dispense: 60 tablet; Refill: 0  2. BMI 29.0-29.9,adult  3. Obesity, Beginning BMI 37.86 Misty Benson is currently in the action stage of change. As such, her goal is to continue with weight loss efforts. She has agreed to the Category 2 Plan and following a lower carbohydrate, vegetable and lean protein rich diet plan.   We discussed the mechanisms of why she will have water fluctuations while follow a low carbohydrate plan. She is considering trying it again.   Behavioral modification strategies: increasing lean protein intake and increasing water intake.  Misty Benson has agreed to follow-up with our clinic in 4 weeks. She was informed of the importance of frequent follow-up visits to maximize her success with intensive lifestyle  modifications for her multiple health conditions.   Objective:   Blood pressure 128/71, pulse 75, temperature 98 F (36.7 C), height '5\' 8"'$  (1.727 m), weight 194 lb (88 kg), last menstrual period 05/30/2012, SpO2 98 %. Body mass index is 29.5 kg/m.  General: Cooperative, alert, well developed, in no acute distress. HEENT: Conjunctivae and lids unremarkable. Cardiovascular: Regular rhythm.  Lungs: Normal work of breathing. Neurologic: No focal deficits.   Lab Results  Component Value Date   CREATININE 0.70 05/30/2022   BUN 30 (H) 05/30/2022   NA 144 05/30/2022   K 4.4 05/30/2022   CL 101 05/30/2022   CO2 24 05/30/2022   Lab Results  Component Value Date   ALT 25 05/30/2022   AST 17 05/30/2022   ALKPHOS 59 05/30/2022   BILITOT 0.3 05/30/2022   Lab Results  Component Value Date   HGBA1C 5.5 05/30/2022   HGBA1C 5.4 10/02/2021   HGBA1C 5.8 (H) 04/05/2021   Lab Results  Component Value Date   INSULIN 5.1 05/30/2022   INSULIN 9.5 04/05/2021   Lab Results  Component Value Date   TSH 2.220 04/05/2021   Lab Results  Component Value Date   CHOL 194 05/30/2022   HDL 85 05/30/2022   LDLCALC 96 05/30/2022   TRIG 70 05/30/2022   CHOLHDL 3.4 02/23/2020   Lab Results  Component Value Date   VD25OH 70.7 05/30/2022   VD25OH 41.1 04/05/2021   VD25OH 41 01/21/2019   Lab Results  Component Value Date   WBC 5.0 05/30/2022  HGB 13.8 05/30/2022   HCT 41.5 05/30/2022   MCV 92 05/30/2022   PLT 262 05/30/2022   No results found for: "IRON", "TIBC", "FERRITIN"  Attestation Statements:   Reviewed by clinician on day of visit: allergies, medications, problem list, medical history, surgical history, family history, social history, and previous encounter notes.  Time spent on visit including pre-visit chart review and post-visit care and charting was 45 minutes.   I, Trixie Dredge, am acting as transcriptionist for Dennard Nip, MD.  I have reviewed the above  documentation for accuracy and completeness, and I agree with the above. -  ***

## 2023-01-22 ENCOUNTER — Other Ambulatory Visit (INDEPENDENT_AMBULATORY_CARE_PROVIDER_SITE_OTHER): Payer: Self-pay | Admitting: Family Medicine

## 2023-01-22 ENCOUNTER — Ambulatory Visit (INDEPENDENT_AMBULATORY_CARE_PROVIDER_SITE_OTHER): Payer: BC Managed Care – PPO | Admitting: Family Medicine

## 2023-01-22 ENCOUNTER — Encounter (INDEPENDENT_AMBULATORY_CARE_PROVIDER_SITE_OTHER): Payer: Self-pay | Admitting: Family Medicine

## 2023-01-22 VITALS — BP 129/68 | HR 92 | Temp 98.2°F | Ht 68.0 in | Wt 191.0 lb

## 2023-01-22 DIAGNOSIS — E669 Obesity, unspecified: Secondary | ICD-10-CM | POA: Diagnosis not present

## 2023-01-22 DIAGNOSIS — Z6829 Body mass index (BMI) 29.0-29.9, adult: Secondary | ICD-10-CM

## 2023-01-22 DIAGNOSIS — R7303 Prediabetes: Secondary | ICD-10-CM

## 2023-01-22 DIAGNOSIS — J3089 Other allergic rhinitis: Secondary | ICD-10-CM

## 2023-01-22 DIAGNOSIS — J309 Allergic rhinitis, unspecified: Secondary | ICD-10-CM | POA: Insufficient documentation

## 2023-01-22 MED ORDER — METFORMIN HCL 500 MG PO TABS: 500.0000 mg | ORAL_TABLET | Freq: Two times a day (BID) | ORAL | 0 refills | Status: DC

## 2023-01-29 NOTE — Progress Notes (Signed)
Chief Complaint:   OBESITY Misty Benson is here to discuss her progress with her obesity treatment plan along with follow-up of her obesity related diagnoses. Misty Benson is on the Category 2 Plan and following a lower carbohydrate, vegetable and lean protein rich diet plan and states she is following her eating plan approximately 80% of the time. Misty Benson states she is doing 0 minutes 0 times per week.  Today's visit was #: 23 Starting weight: 249 lbs Starting date: 04/05/2021 Today's weight: 191 lbs Today's date: 01/22/2023 Total lbs lost to date: 58 Total lbs lost since last in-office visit: 3  Interim History: Misty Benson continues to do well with weight loss even with traveling last week. Her hunger is controlled and she is doing well with meal planning.   Subjective:   1. Pre-diabetes Misty Benson is on metformin, and she often misses her second dose. She has question about the biochemistry of pre-diabetes and how metformin affects polyphagia and fat storage.   2. Seasonal allergic rhinitis due to other allergic trigger Misty Benson notes worsening rhinitis and post-nasal drip. She is on OTC Allegra and Mucinex with minimal benefits.   Assessment/Plan:   1. Pre-diabetes We will refill metformin for 1 month. Misty Benson's questions were answered in depth to her satisfaction.   - metFORMIN (GLUCOPHAGE) 500 MG tablet; Take 1 tablet (500 mg total) by mouth 2 (two) times daily with a meal.  Dispense: 60 tablet; Refill: 0  2. Seasonal allergic rhinitis due to other allergic trigger Misty Benson was recommended to try OTC Zyrtec-D to help her symptoms.   3. BMI 29.0-29.9,adult  4. Obesity, Beginning BMI 37.86 Misty Benson is currently in the action stage of change. As such, her goal is to continue with weight loss efforts. She has agreed to the Category 2 Plan.   Exercise goals: All adults should avoid inactivity. Some physical activity is better than none, and adults who participate in any amount of physical activity gain some  health benefits.  Behavioral modification strategies: increasing lean protein intake.  Misty Benson has agreed to follow-up with our clinic in 4 weeks. She was informed of the importance of frequent follow-up visits to maximize her success with intensive lifestyle modifications for her multiple health conditions.   Objective:   Blood pressure 129/68, pulse 92, temperature 98.2 F (36.8 C), height 5\' 8"  (1.727 m), weight 191 lb (86.6 kg), last menstrual period 05/30/2012, SpO2 93 %. Body mass index is 29.04 kg/m.  Lab Results  Component Value Date   CREATININE 0.70 05/30/2022   BUN 30 (H) 05/30/2022   NA 144 05/30/2022   K 4.4 05/30/2022   CL 101 05/30/2022   CO2 24 05/30/2022   Lab Results  Component Value Date   ALT 25 05/30/2022   AST 17 05/30/2022   ALKPHOS 59 05/30/2022   BILITOT 0.3 05/30/2022   Lab Results  Component Value Date   HGBA1C 5.5 05/30/2022   HGBA1C 5.4 10/02/2021   HGBA1C 5.8 (H) 04/05/2021   Lab Results  Component Value Date   INSULIN 5.1 05/30/2022   INSULIN 9.5 04/05/2021   Lab Results  Component Value Date   TSH 2.220 04/05/2021   Lab Results  Component Value Date   CHOL 194 05/30/2022   HDL 85 05/30/2022   LDLCALC 96 05/30/2022   TRIG 70 05/30/2022   CHOLHDL 3.4 02/23/2020   Lab Results  Component Value Date   VD25OH 70.7 05/30/2022   VD25OH 41.1 04/05/2021   VD25OH 41 01/21/2019   Lab Results  Component Value Date   WBC 5.0 05/30/2022   HGB 13.8 05/30/2022   HCT 41.5 05/30/2022   MCV 92 05/30/2022   PLT 262 05/30/2022   No results found for: "IRON", "TIBC", "FERRITIN"  Attestation Statements:   Reviewed by clinician on day of visit: allergies, medications, problem list, medical history, surgical history, family history, social history, and previous encounter notes.  Time spent on visit including pre-visit chart review and post-visit care and charting was 40 minutes.   I, Trixie Dredge, am acting as transcriptionist for Dennard Nip, MD.  I have reviewed the above documentation for accuracy and completeness, and I agree with the above. -  Dennard Nip, MD

## 2023-02-02 ENCOUNTER — Other Ambulatory Visit: Payer: Self-pay | Admitting: Internal Medicine

## 2023-02-02 DIAGNOSIS — I10 Essential (primary) hypertension: Secondary | ICD-10-CM

## 2023-02-02 DIAGNOSIS — F900 Attention-deficit hyperactivity disorder, predominantly inattentive type: Secondary | ICD-10-CM

## 2023-02-11 ENCOUNTER — Ambulatory Visit (INDEPENDENT_AMBULATORY_CARE_PROVIDER_SITE_OTHER): Payer: BC Managed Care – PPO | Admitting: Internal Medicine

## 2023-02-11 ENCOUNTER — Encounter: Payer: Self-pay | Admitting: Internal Medicine

## 2023-02-11 VITALS — BP 105/68 | HR 68 | Temp 98.4°F | Wt 195.8 lb

## 2023-02-11 DIAGNOSIS — R7303 Prediabetes: Secondary | ICD-10-CM

## 2023-02-11 DIAGNOSIS — F900 Attention-deficit hyperactivity disorder, predominantly inattentive type: Secondary | ICD-10-CM

## 2023-02-11 DIAGNOSIS — I10 Essential (primary) hypertension: Secondary | ICD-10-CM

## 2023-02-11 LAB — POCT GLYCOSYLATED HEMOGLOBIN (HGB A1C): Hemoglobin A1C: 5.4 % (ref 4.0–5.6)

## 2023-02-11 MED ORDER — AMPHETAMINE-DEXTROAMPHETAMINE 20 MG PO TABS: 20.0000 mg | ORAL_TABLET | Freq: Two times a day (BID) | ORAL | 0 refills | Status: DC

## 2023-02-11 MED ORDER — VERAPAMIL HCL ER 240 MG PO TBCR: 240.0000 mg | EXTENDED_RELEASE_TABLET | Freq: Every day | ORAL | 1 refills | Status: DC

## 2023-02-11 NOTE — Progress Notes (Signed)
Established Patient Office Visit     CC/Reason for Visit: Follow-up chronic conditions  HPI: Misty Benson is a 62 y.o. female who is coming in today for the above mentioned reasons. Past Medical History is significant for: ADHD on Adderall, hypertension, hyperlipidemia, impaired glucose tolerance, depression, obesity.  She is doing well and has no acute concerns or complaints.   Past Medical/Surgical History: Past Medical History:  Diagnosis Date   ADHD (attention deficit hyperactivity disorder)    Anxiety    Back pain    Cancer    BASAL CELL FACE AND SHOULDERS , SQUAMOS CELL ON HAND AND MELANOMA LEFT ARM...   Cervix cancer 1987   microinvasive   Constipation    Edema of both lower extremities    Hypertension    Joint pain    Leiomyoma 2013   multiple small   Melanoma    Obesity    Prediabetes    Shoulder pain    Snoring     Past Surgical History:  Procedure Laterality Date   arm surgery     to remove melanoma on left upper outter arm   BUNIONECTOMY     LEFT FOOT WITH PINS   CERVICAL CONE BIOPSY  11/11/1985   CESAREAN SECTION     DILATION AND CURETTAGE OF UTERUS     x2   FOOT SURGERY     MELANOMA EXCISION     TONSILLECTOMY     as a child   TOTAL HIP ARTHROPLASTY Left 06/06/2022    Social History:  reports that she has never smoked. She has never used smokeless tobacco. She reports current alcohol use of about 4.0 standard drinks of alcohol per week. She reports that she does not use drugs.  Allergies: No Known Allergies  Family History:  Family History  Problem Relation Age of Onset   Stroke Mother    Osteoporosis Mother    Hypertension Mother    Hyperlipidemia Father    Atrial fibrillation Father    Cancer Father        Prostate   Heart defect Father    Breast cancer Maternal Grandmother 91     Current Outpatient Medications:    amphetamine-dextroamphetamine (ADDERALL) 20 MG tablet, Take 1 tablet (20 mg total) by mouth 2 (two) times  daily., Disp: 60 tablet, Rfl: 0   amphetamine-dextroamphetamine (ADDERALL) 20 MG tablet, Take 1 tablet (20 mg total) by mouth 2 (two) times daily., Disp: 60 tablet, Rfl: 0   Cholecalciferol (VITAMIN D3) 125 MCG (5000 UT) CAPS, Take 1 cap daily Monday through Friday and 2 caps daily Saturday and Sunday., Disp: 30 capsule, Rfl: 0   citalopram (CELEXA) 20 MG tablet, TAKE 1 TABLET(20 MG) BY MOUTH DAILY, Disp: 90 tablet, Rfl: 0   diclofenac (VOLTAREN) 50 MG EC tablet, Take 50 mg by mouth 2 (two) times daily as needed., Disp: , Rfl:    gabapentin (NEURONTIN) 300 MG capsule, Take 300 mg by mouth at bedtime as needed., Disp: , Rfl:    hydrochlorothiazide (MICROZIDE) 12.5 MG capsule, TAKE 1 CAPSULE(12.5 MG) BY MOUTH DAILY, Disp: 90 capsule, Rfl: 0   ibuprofen (ADVIL,MOTRIN) 200 MG tablet, Take 3-4 tablets (600-800 mg total) by mouth every 6 (six) hours as needed for fever, headache, mild pain, moderate pain or cramping., Disp: 30 tablet, Rfl: 6   metFORMIN (GLUCOPHAGE) 500 MG tablet, Take 1 tablet (500 mg total) by mouth 2 (two) times daily with a meal., Disp: 60 tablet, Rfl: 0  Multiple Vitamin (MULTIVITAMIN PO), Take by mouth., Disp: , Rfl:    Omega-3 1000 MG CAPS, Take by mouth., Disp: , Rfl:    amphetamine-dextroamphetamine (ADDERALL) 20 MG tablet, Take 1 tablet (20 mg total) by mouth 2 (two) times daily., Disp: 60 tablet, Rfl: 0   verapamil (CALAN-SR) 240 MG CR tablet, Take 1 tablet (240 mg total) by mouth daily., Disp: 90 tablet, Rfl: 1  Review of Systems:  Negative unless indicated in HPI.   Physical Exam: Vitals:   02/11/23 1535  BP: 105/68  Pulse: 68  Temp: 98.4 F (36.9 C)  TempSrc: Oral  SpO2: 98%  Weight: 195 lb 12.8 oz (88.8 kg)    Body mass index is 29.77 kg/m.   Physical Exam Vitals reviewed.  Constitutional:      Appearance: Normal appearance.  HENT:     Head: Normocephalic and atraumatic.  Eyes:     Conjunctiva/sclera: Conjunctivae normal.     Pupils: Pupils are  equal, round, and reactive to light.  Cardiovascular:     Rate and Rhythm: Normal rate and regular rhythm.  Pulmonary:     Effort: Pulmonary effort is normal.     Breath sounds: Normal breath sounds.  Skin:    General: Skin is warm and dry.  Neurological:     General: No focal deficit present.     Mental Status: She is alert and oriented to person, place, and time.  Psychiatric:        Mood and Affect: Mood normal.        Behavior: Behavior normal.        Thought Content: Thought content normal.        Judgment: Judgment normal.      Impression and Plan:  Prediabetes - Plan: POC HgB A1c  Attention deficit hyperactivity disorder (ADHD), predominantly inattentive type - Plan: amphetamine-dextroamphetamine (ADDERALL) 20 MG tablet, amphetamine-dextroamphetamine (ADDERALL) 20 MG tablet, amphetamine-dextroamphetamine (ADDERALL) 20 MG tablet  Essential hypertension - Plan: verapamil (CALAN-SR) 240 MG CR tablet  -Blood pressure is well-controlled. -A1c is stable at 5.4. -PDMP reviewed, no red flags, overdose risk score is 160. -Refill Adderall 20 mg to take 1 tablet twice daily for total of 60 tablets a month x 3 months.   Time spent:31 minutes reviewing chart, interviewing and examining patient and formulating plan of care.     Lelon Frohlich, MD [facility]

## 2023-02-20 ENCOUNTER — Encounter (INDEPENDENT_AMBULATORY_CARE_PROVIDER_SITE_OTHER): Payer: Self-pay | Admitting: Family Medicine

## 2023-02-20 ENCOUNTER — Ambulatory Visit (INDEPENDENT_AMBULATORY_CARE_PROVIDER_SITE_OTHER): Payer: BC Managed Care – PPO | Admitting: Family Medicine

## 2023-02-20 ENCOUNTER — Other Ambulatory Visit (INDEPENDENT_AMBULATORY_CARE_PROVIDER_SITE_OTHER): Payer: Self-pay | Admitting: Family Medicine

## 2023-02-20 VITALS — BP 122/73 | HR 66 | Temp 97.9°F | Ht 68.0 in | Wt 194.0 lb

## 2023-02-20 DIAGNOSIS — R7303 Prediabetes: Secondary | ICD-10-CM | POA: Diagnosis not present

## 2023-02-20 DIAGNOSIS — Z6829 Body mass index (BMI) 29.0-29.9, adult: Secondary | ICD-10-CM

## 2023-02-20 DIAGNOSIS — E669 Obesity, unspecified: Secondary | ICD-10-CM | POA: Diagnosis not present

## 2023-02-20 MED ORDER — METFORMIN HCL 500 MG PO TABS: 500.0000 mg | ORAL_TABLET | Freq: Two times a day (BID) | ORAL | 0 refills | Status: DC

## 2023-02-24 NOTE — Progress Notes (Unsigned)
Chief Complaint:   OBESITY Misty Benson is here to discuss her progress with her obesity treatment plan along with follow-up of her obesity related diagnoses. Misty Benson is on the Category 2 Plan and states she is following her eating plan approximately 85% of the time. Misty Benson states she is walking 4 times per week.  Today's visit was #: 28 Starting weight: 249 lbs Starting date: 04/05/2021 Today's weight: 194 lbs Today's date: 02/20/2023 Total lbs lost to date: 55 Total lbs lost since last in-office visit: 0  Interim History: Misty Benson is struggling with calories going to high on her low carbohydrate plan.  She has questions about sugar and various food choices, and she is looking at changing her eating plan.  Subjective:   1. Pre-diabetes Misty Benson is on metformin, and she needs a refill.  She is taking it more regularly and she has no nausea or vomiting.  Assessment/Plan:   1. Pre-diabetes Misty Benson will continue with her diet and work on decreasing her sugar intake.  We will refill metformin for 1 month.  - metFORMIN (GLUCOPHAGE) 500 MG tablet; Take 1 tablet (500 mg total) by mouth 2 (two) times daily with a meal.  Dispense: 60 tablet; Refill: 0  2. BMI 29.0-29.9,adult  3. Obesity, Beginning BMI 37.86 Misty Benson is currently in the action stage of change. As such, her goal is to continue with weight loss efforts. She has agreed to keeping a food journal and adhering to recommended goals of 1200-1500 calories and 85+ grams of protein daily. (Keep sugar below 25 grams daily).   Exercise goals: As is.   Behavioral modification strategies: increasing lean protein intake.  Misty Benson has agreed to follow-up with our clinic in 4 weeks. She was informed of the importance of frequent follow-up visits to maximize her success with intensive lifestyle modifications for her multiple health conditions.   Objective:   Blood pressure 122/73, pulse 66, temperature 97.9 F (36.6 C), height  (1.727 m), weight 194  lb (88 kg), last menstrual period 05/30/2012, SpO2 97 %. Body mass index is 29.5 kg/m.  Lab Results  Component Value Date   CREATININE 0.70 05/30/2022   BUN 30 (H) 05/30/2022   NA 144 05/30/2022   K 4.4 05/30/2022   CL 101 05/30/2022   CO2 24 05/30/2022   Lab Results  Component Value Date   ALT 25 05/30/2022   AST 17 05/30/2022   ALKPHOS 59 05/30/2022   BILITOT 0.3 05/30/2022   Lab Results  Component Value Date   HGBA1C 5.4 02/11/2023   HGBA1C 5.5 05/30/2022   HGBA1C 5.4 10/02/2021   HGBA1C 5.8 (H) 04/05/2021   Lab Results  Component Value Date   INSULIN 5.1 05/30/2022   INSULIN 9.5 04/05/2021   Lab Results  Component Value Date   TSH 2.220 04/05/2021   Lab Results  Component Value Date   CHOL 194 05/30/2022   HDL 85 05/30/2022   LDLCALC 96 05/30/2022   TRIG 70 05/30/2022   CHOLHDL 3.4 02/23/2020   Lab Results  Component Value Date   VD25OH 70.7 05/30/2022   VD25OH 41.1 04/05/2021   VD25OH 41 01/21/2019   Lab Results  Component Value Date   WBC 5.0 05/30/2022   HGB 13.8 05/30/2022   HCT 41.5 05/30/2022   MCV 92 05/30/2022   PLT 262 05/30/2022   No results found for: "IRON", "TIBC", "FERRITIN"  Attestation Statements:   Reviewed by clinician on day of visit: allergies, medications, problem list, medical history, surgical  history, family history, social history, and previous encounter notes.  Time spent on visit including pre-visit chart review and post-visit care and charting was 30 minutes.   I, Burt Knack, am acting as transcriptionist for Quillian Quince, MD.  I have reviewed the above documentation for accuracy and completeness, and I agree with the above. -  Quillian Quince, MD

## 2023-03-03 DIAGNOSIS — D485 Neoplasm of uncertain behavior of skin: Secondary | ICD-10-CM | POA: Diagnosis not present

## 2023-03-03 DIAGNOSIS — D2272 Melanocytic nevi of left lower limb, including hip: Secondary | ICD-10-CM | POA: Diagnosis not present

## 2023-03-03 DIAGNOSIS — Z85820 Personal history of malignant melanoma of skin: Secondary | ICD-10-CM | POA: Diagnosis not present

## 2023-03-03 DIAGNOSIS — L570 Actinic keratosis: Secondary | ICD-10-CM | POA: Diagnosis not present

## 2023-03-03 DIAGNOSIS — Z85828 Personal history of other malignant neoplasm of skin: Secondary | ICD-10-CM | POA: Diagnosis not present

## 2023-03-03 DIAGNOSIS — L738 Other specified follicular disorders: Secondary | ICD-10-CM | POA: Diagnosis not present

## 2023-03-27 ENCOUNTER — Ambulatory Visit (INDEPENDENT_AMBULATORY_CARE_PROVIDER_SITE_OTHER): Payer: BC Managed Care – PPO | Admitting: Family Medicine

## 2023-04-10 ENCOUNTER — Ambulatory Visit (INDEPENDENT_AMBULATORY_CARE_PROVIDER_SITE_OTHER): Payer: BC Managed Care – PPO | Admitting: Physician Assistant

## 2023-04-23 ENCOUNTER — Encounter (INDEPENDENT_AMBULATORY_CARE_PROVIDER_SITE_OTHER): Payer: Self-pay | Admitting: Physician Assistant

## 2023-04-23 ENCOUNTER — Ambulatory Visit (INDEPENDENT_AMBULATORY_CARE_PROVIDER_SITE_OTHER): Payer: BC Managed Care – PPO | Admitting: Physician Assistant

## 2023-04-23 VITALS — BP 130/75 | HR 62 | Temp 98.1°F | Ht 68.0 in | Wt 195.0 lb

## 2023-04-23 DIAGNOSIS — E669 Obesity, unspecified: Secondary | ICD-10-CM | POA: Diagnosis not present

## 2023-04-23 DIAGNOSIS — M898X7 Other specified disorders of bone, ankle and foot: Secondary | ICD-10-CM | POA: Diagnosis not present

## 2023-04-23 DIAGNOSIS — E7849 Other hyperlipidemia: Secondary | ICD-10-CM | POA: Diagnosis not present

## 2023-04-23 DIAGNOSIS — Z6829 Body mass index (BMI) 29.0-29.9, adult: Secondary | ICD-10-CM

## 2023-04-23 DIAGNOSIS — R7303 Prediabetes: Secondary | ICD-10-CM

## 2023-04-23 NOTE — Progress Notes (Signed)
.smr  Office: [phone]  /  Fax: [phone]  WEIGHT SUMMARY AND BIOMETRICS  Vitals Temp: 98.1 F (36.7 C) BP: 130/75 Pulse Rate: 62 SpO2: 98 %   Anthropometric Measurements Height: 5\' 8"  (1.727 m) Weight: 195 lb (88.5 kg) BMI (Calculated): 29.66 Weight at Last Visit: 194 lb Weight Gained Since Last Visit: 1 lb Starting Weight: 249 lb   Body Composition  Body Fat %: 44 % Fat Mass (lbs): 85.8 lbs Muscle Mass (lbs): 103.6 lbs Total Body Water (lbs): 81.8 lbs Visceral Fat Rating : 11   Other Clinical Data Labs: no Today's Visit #: 29 Starting Date: 04/05/21     HPI  Chief Complaint: OBESITY  Misty Benson is here to discuss her progress with her obesity treatment plan. She is on the the Category 2 Plan and states she is following her eating plan approximately 60-70 % of the time. She states she is exercising gardening/walking 30 minutes 6-7 times per week.   Interval History:  Since last office visit she is up 1lb/ Down 54 lbs overall and has maintained well overall.  She has been traveling some and feels she got off track a bit and is working to get back on track with her nutrition plan.  Hunger/appetite-more hungry lately, but better now that she is getting back on track Cravings- not excessive Stress- increased with some unplanned traveling, but better now Exercise-gardening and walking regularly   Pharmacotherapy: Metformin 500 mg twice daily. Not always remembering to take lately. Feels more hungry in evenings and we discussed may help to take metformin later during the day as long as the 2 doses are at least 4 hrs apart. No GI side effects with metformin.   TREATMENT PLAN FOR OBESITY:  Recommended Dietary Goals  Misty Benson is currently in the action stage of change. As such, her goal is to continue weight management plan. She has agreed to the Category 2 Plan.  Behavioral Intervention  We discussed the following Behavioral Modification Strategies today:  increasing lean protein intake, decreasing simple carbohydrates , increasing vegetables, increasing lower glycemic fruits, increasing fiber rich foods, increasing water intake, continue to practice mindfulness when eating, and planning for success.  Additional resources provided today: NA  Recommended Physical Activity Goals  Misty Benson has been advised to work up to 150 minutes of moderate intensity aerobic activity a week and strengthening exercises 2-3 times per week for cardiovascular health, weight loss maintenance and preservation of muscle mass.   She has agreed to Continue current level of physical activity    Pharmacotherapy We discussed various medication options to help Misty Benson with her weight loss efforts and we both agreed to continue metformin 500 mg twice daily. .    Return in about 3 weeks (around 05/14/2023).Marland Kitchen She was informed of the importance of frequent follow up visits to maximize her success with intensive lifestyle modifications for her multiple health conditions.  PHYSICAL EXAM:  Blood pressure 130/75, pulse 62, temperature 98.1 F (36.7 C), height 5\' 8"  (1.727 m), weight 195 lb (88.5 kg), last menstrual period 05/30/2012, SpO2 98 %. Body mass index is 29.65 kg/m.  General: She is overweight, cooperative, alert, well developed, and in no acute distress. PSYCH: Has normal mood, affect and thought process.   Cardiovascular: HR 60's regular, BP 130/75 Lungs: Normal breathing effort, no conversational dyspnea. Neuro: no focal deficits  DIAGNOSTIC DATA REVIEWED:  BMET    Component Value Date/Time   NA 144 05/30/2022 1301   K 4.4 05/30/2022 1301   CL  101 05/30/2022 1301   CO2 24 05/30/2022 1301   GLUCOSE 80 05/30/2022 1301   GLUCOSE 93 02/23/2020 1230   BUN 30 (H) 05/30/2022 1301   CREATININE 0.70 05/30/2022 1301   CREATININE 0.78 02/23/2020 1230   CALCIUM 9.7 05/30/2022 1301   GFRNONAA >60 02/24/2016 1150   GFRAA >60 02/24/2016 1150   Lab Results  Component  Value Date   HGBA1C 5.4 02/11/2023   HGBA1C 5.8 (H) 04/05/2021   Lab Results  Component Value Date   INSULIN 5.1 05/30/2022   INSULIN 9.5 04/05/2021   Lab Results  Component Value Date   TSH 2.220 04/05/2021   CBC    Component Value Date/Time   WBC 5.0 05/30/2022 1301   WBC 5.4 02/23/2020 1230   RBC 4.49 05/30/2022 1301   RBC 4.33 02/23/2020 1230   HGB 13.8 05/30/2022 1301   HCT 41.5 05/30/2022 1301   PLT 262 05/30/2022 1301   MCV 92 05/30/2022 1301   MCH 30.7 05/30/2022 1301   MCH 30.7 02/23/2020 1230   MCHC 33.3 05/30/2022 1301   MCHC 33.0 02/23/2020 1230   RDW 11.9 05/30/2022 1301   Iron Studies No results found for: "IRON", "TIBC", "FERRITIN", "IRONPCTSAT" Lipid Panel     Component Value Date/Time   CHOL 194 05/30/2022 1301   TRIG 70 05/30/2022 1301   HDL 85 05/30/2022 1301   CHOLHDL 3.4 02/23/2020 1230   VLDL 19.0 07/30/2016 0859   LDLCALC 96 05/30/2022 1301   LDLCALC 140 (H) 02/23/2020 1230   Hepatic Function Panel     Component Value Date/Time   PROT 6.2 05/30/2022 1301   ALBUMIN 4.4 05/30/2022 1301   AST 17 05/30/2022 1301   ALT 25 05/30/2022 1301   ALKPHOS 59 05/30/2022 1301   BILITOT 0.3 05/30/2022 1301   BILIDIR 0.0 07/30/2016 0859      Component Value Date/Time   TSH 2.220 04/05/2021 0925   Nutritional Lab Results  Component Value Date   VD25OH 70.7 05/30/2022   VD25OH 41.1 04/05/2021   VD25OH 41 01/21/2019    ASSOCIATED CONDITIONS ADDRESSED TODAY  ASSESSMENT AND PLAN  Problem List Items Addressed This Visit     Hyperlipidemia   Prediabetes - Primary   BMI 29.0-29.9,adult   Obesity, Beginning BMI 37.86   Prediabetes Last A1c was 5.4/ Insulin 5.1  Medication(s): Metformin 500 mg twice daily with meals No side effects with metformin, but not consistently taking more recently.  Polyphagia:No Lab Results  Component Value Date   HGBA1C 5.4 02/11/2023   HGBA1C 5.5 05/30/2022   HGBA1C 5.4 10/02/2021   HGBA1C 5.8 (H) 04/05/2021    Lab Results  Component Value Date   INSULIN 5.1 05/30/2022   INSULIN 9.5 04/05/2021    Plan: Continue Metformin 500 mg twice daily with meals  Reports no refills needed today and will begin taking more consistently over the next month.  Continue working on nutrition plan to decrease simple carbohydrates, increase lean proteins and exercise to promote weight loss, improve glycemic control and prevent progression to Type 2 diabetes.    Hyperlipidemia LDL is at goal, down from 829'F. HDL 85/ Triglycerides 70- all improved last check in 05/2022 from previous values. Reports rarely eating red meats and watching saturated fats and cholesterol and minimizes as much as possible.  Medication(s): Omega 3 1000 mg daily Cardiovascular risk factors: dyslipidemia and hypertension  Lab Results  Component Value Date   CHOL 194 05/30/2022   HDL 85 05/30/2022   LDLCALC 96 05/30/2022  TRIG 70 05/30/2022   CHOLHDL 3.4 02/23/2020   CHOLHDL 2.6 01/21/2019   CHOLHDL 2.5 07/18/2017   Lab Results  Component Value Date   ALT 25 05/30/2022   AST 17 05/30/2022   ALKPHOS 59 05/30/2022   BILITOT 0.3 05/30/2022   The 10-year ASCVD risk score (Arnett DK, et al., 2019) is: 4.3%   Values used to calculate the score:     Age: 51 years     Sex: Female     Is Non-Hispanic African American: No     Diabetic: No     Tobacco smoker: No     Systolic Blood Pressure: 130 mmHg     Is BP treated: Yes     HDL Cholesterol: 85 mg/dL     Total Cholesterol: 194 mg/dL  Plan: Continue Omega 3 supplement.  Continue to work on nutrition plan -decreasing simple carbohydrates, increasing lean proteins, decreasing saturated fats and cholesterol , avoiding trans fats and exercise as able to promote weight loss, improve lipids and decrease cardiovascular risks. May want to repeat labs over the next 2 months.   ATTESTASTION STATEMENTS:  Reviewed by clinician on day of visit: allergies, medications, problem list, medical  history, surgical history, family history, social history, and previous encounter notes.   I have personally spent 45 minutes total time today in preparation, patient care, nutritional counseling and documentation for this visit, including the following: review of clinical lab tests; review of medical tests/procedures/services.      Taraya Steward, PA-C

## 2023-04-30 ENCOUNTER — Other Ambulatory Visit: Payer: Self-pay | Admitting: Internal Medicine

## 2023-04-30 DIAGNOSIS — F900 Attention-deficit hyperactivity disorder, predominantly inattentive type: Secondary | ICD-10-CM

## 2023-04-30 DIAGNOSIS — I10 Essential (primary) hypertension: Secondary | ICD-10-CM

## 2023-05-22 ENCOUNTER — Encounter (INDEPENDENT_AMBULATORY_CARE_PROVIDER_SITE_OTHER): Payer: Self-pay

## 2023-05-22 ENCOUNTER — Ambulatory Visit (INDEPENDENT_AMBULATORY_CARE_PROVIDER_SITE_OTHER): Payer: BC Managed Care – PPO | Admitting: Physician Assistant

## 2023-06-16 DIAGNOSIS — M62830 Muscle spasm of back: Secondary | ICD-10-CM | POA: Diagnosis not present

## 2023-06-16 DIAGNOSIS — M9902 Segmental and somatic dysfunction of thoracic region: Secondary | ICD-10-CM | POA: Diagnosis not present

## 2023-06-16 DIAGNOSIS — M9903 Segmental and somatic dysfunction of lumbar region: Secondary | ICD-10-CM | POA: Diagnosis not present

## 2023-06-16 DIAGNOSIS — M5440 Lumbago with sciatica, unspecified side: Secondary | ICD-10-CM | POA: Diagnosis not present

## 2023-06-18 DIAGNOSIS — M5440 Lumbago with sciatica, unspecified side: Secondary | ICD-10-CM | POA: Diagnosis not present

## 2023-06-18 DIAGNOSIS — M9902 Segmental and somatic dysfunction of thoracic region: Secondary | ICD-10-CM | POA: Diagnosis not present

## 2023-06-18 DIAGNOSIS — M62830 Muscle spasm of back: Secondary | ICD-10-CM | POA: Diagnosis not present

## 2023-06-18 DIAGNOSIS — M9903 Segmental and somatic dysfunction of lumbar region: Secondary | ICD-10-CM | POA: Diagnosis not present

## 2023-06-23 ENCOUNTER — Telehealth: Payer: Self-pay | Admitting: Internal Medicine

## 2023-06-23 DIAGNOSIS — F900 Attention-deficit hyperactivity disorder, predominantly inattentive type: Secondary | ICD-10-CM

## 2023-06-23 MED ORDER — AMPHETAMINE-DEXTROAMPHETAMINE 20 MG PO TABS: 20.0000 mg | ORAL_TABLET | Freq: Two times a day (BID) | ORAL | 0 refills | Status: DC

## 2023-06-23 NOTE — Telephone Encounter (Signed)
Prescription Request  06/23/2023  LOV: 02/11/2023  What is the name of the medication or equipment? amphetamine-dextroamphetamine (ADDERALL) 20 MG tablet   Pt's CPE has been scheduled for 08/21/23 Pt is aware MD is OOO.  Have you contacted your pharmacy to request a refill? Yes   Which pharmacy would you like this sent to?   [facility][id number] Ginette Otto,  -  [address] AT [facility] OF [address] [place] [address] Phone: [phone] Fax: [phone]    Patient notified that their request is being sent to the clinical staff for review and that they should receive a response within 2 business days.   Please advise at Mobile [phone] (mobile)

## 2023-06-30 DIAGNOSIS — M1612 Unilateral primary osteoarthritis, left hip: Secondary | ICD-10-CM | POA: Diagnosis not present

## 2023-07-03 ENCOUNTER — Encounter (INDEPENDENT_AMBULATORY_CARE_PROVIDER_SITE_OTHER): Payer: Self-pay | Admitting: Family Medicine

## 2023-07-03 ENCOUNTER — Ambulatory Visit (INDEPENDENT_AMBULATORY_CARE_PROVIDER_SITE_OTHER): Payer: BC Managed Care – PPO | Admitting: Family Medicine

## 2023-07-03 VITALS — BP 125/70 | HR 71 | Temp 98.2°F | Ht 68.0 in | Wt 193.0 lb

## 2023-07-03 DIAGNOSIS — E669 Obesity, unspecified: Secondary | ICD-10-CM

## 2023-07-03 DIAGNOSIS — Z6829 Body mass index (BMI) 29.0-29.9, adult: Secondary | ICD-10-CM

## 2023-07-03 DIAGNOSIS — R7303 Prediabetes: Secondary | ICD-10-CM

## 2023-07-03 NOTE — Progress Notes (Signed)
Chief Complaint:   OBESITY Misty Benson is here to discuss her progress with her obesity treatment plan along with follow-up of her obesity related diagnoses. Misty Benson is on keeping a food journal and adhering to recommended goals of 1200-1500 calories and 85+ grams of protein and states she is following her eating plan approximately 60% of the time. Misty Benson states she is walking for 20 minutes 3-4 times per week.  Today's visit was #: 30 Starting weight: 249 lbs Starting date: 04/05/2021 Today's weight: 193 lbs Today's date: 07/03/2023 Total lbs lost to date: 56 Total lbs lost since last in-office visit: 2  Interim History: Patient continues to do well with her weight loss.  She has been out of her normal routine with increased traveling.  She did a lot of walking.  She is working on increasing fiber in her diet.  Subjective:   1. Prediabetes Patient is on metformin 1-2 times daily.  She is doing well with her diet, exercise, and weight loss.  She denies nausea or vomiting.  Assessment/Plan:   1. Prediabetes Patient will continue metformin twice daily, and we will continue to follow.  2. BMI 29.0-29.9,adult Current BMI 29.6  3. Obesity, Beginning BMI 37.86 Misty Benson is currently in the action stage of change. As such, her goal is to continue with weight loss efforts. She has agreed to the Category 2 Plan or keeping a food journal and adhering to recommended goals of 1200 calories and 75+ grams of protein daily.   Exercise goals: As is.   Behavioral modification strategies: increasing lean protein intake and no skipping meals.  Misty Benson has agreed to follow-up with our clinic in 4 weeks. She was informed of the importance of frequent follow-up visits to maximize her success with intensive lifestyle modifications for her multiple health conditions.   Objective:   Blood pressure 125/70, pulse 71, temperature 98.2 F (36.8 C), height 5\' 8"  (1.727 m), weight 193 lb (87.5 kg), last menstrual  period 05/30/2012, SpO2 98%. Body mass index is 29.35 kg/m.  Lab Results  Component Value Date   CREATININE 0.70 05/30/2022   BUN 30 (H) 05/30/2022   NA 144 05/30/2022   K 4.4 05/30/2022   CL 101 05/30/2022   CO2 24 05/30/2022   Lab Results  Component Value Date   ALT 25 05/30/2022   AST 17 05/30/2022   ALKPHOS 59 05/30/2022   BILITOT 0.3 05/30/2022   Lab Results  Component Value Date   HGBA1C 5.4 02/11/2023   HGBA1C 5.5 05/30/2022   HGBA1C 5.4 10/02/2021   HGBA1C 5.8 (H) 04/05/2021   Lab Results  Component Value Date   INSULIN 5.1 05/30/2022   INSULIN 9.5 04/05/2021   Lab Results  Component Value Date   TSH 2.220 04/05/2021   Lab Results  Component Value Date   CHOL 194 05/30/2022   HDL 85 05/30/2022   LDLCALC 96 05/30/2022   TRIG 70 05/30/2022   CHOLHDL 3.4 02/23/2020   Lab Results  Component Value Date   VD25OH 70.7 05/30/2022   VD25OH 41.1 04/05/2021   VD25OH 41 01/21/2019   Lab Results  Component Value Date   WBC 5.0 05/30/2022   HGB 13.8 05/30/2022   HCT 41.5 05/30/2022   MCV 92 05/30/2022   PLT 262 05/30/2022   No results found for: "IRON", "TIBC", "FERRITIN"  Attestation Statements:   Reviewed by clinician on day of visit: allergies, medications, problem list, medical history, surgical history, family history, social history, and previous encounter  notes.  Time spent on visit including pre-visit chart review and post-visit care and charting was 30 minutes.   I, Burt Knack, am acting as transcriptionist for Quillian Quince, MD.  I have reviewed the above documentation for accuracy and completeness, and I agree with the above. -  Quillian Quince, MD

## 2023-07-16 DIAGNOSIS — H903 Sensorineural hearing loss, bilateral: Secondary | ICD-10-CM | POA: Diagnosis not present

## 2023-07-16 DIAGNOSIS — H9313 Tinnitus, bilateral: Secondary | ICD-10-CM | POA: Diagnosis not present

## 2023-07-16 DIAGNOSIS — H6123 Impacted cerumen, bilateral: Secondary | ICD-10-CM | POA: Diagnosis not present

## 2023-07-21 ENCOUNTER — Other Ambulatory Visit: Payer: Self-pay | Admitting: Internal Medicine

## 2023-07-21 DIAGNOSIS — Z1231 Encounter for screening mammogram for malignant neoplasm of breast: Secondary | ICD-10-CM

## 2023-07-28 ENCOUNTER — Other Ambulatory Visit: Payer: Self-pay | Admitting: Internal Medicine

## 2023-07-28 DIAGNOSIS — F900 Attention-deficit hyperactivity disorder, predominantly inattentive type: Secondary | ICD-10-CM

## 2023-07-28 DIAGNOSIS — I10 Essential (primary) hypertension: Secondary | ICD-10-CM

## 2023-07-29 ENCOUNTER — Ambulatory Visit: Payer: BC Managed Care – PPO

## 2023-07-31 ENCOUNTER — Ambulatory Visit (INDEPENDENT_AMBULATORY_CARE_PROVIDER_SITE_OTHER): Payer: BC Managed Care – PPO | Admitting: Family Medicine

## 2023-07-31 ENCOUNTER — Encounter (INDEPENDENT_AMBULATORY_CARE_PROVIDER_SITE_OTHER): Payer: Self-pay | Admitting: Family Medicine

## 2023-07-31 ENCOUNTER — Other Ambulatory Visit: Payer: Self-pay | Admitting: Internal Medicine

## 2023-07-31 VITALS — BP 124/75 | HR 72 | Temp 98.1°F | Ht 68.0 in | Wt 193.0 lb

## 2023-07-31 DIAGNOSIS — Z6829 Body mass index (BMI) 29.0-29.9, adult: Secondary | ICD-10-CM | POA: Diagnosis not present

## 2023-07-31 DIAGNOSIS — R7303 Prediabetes: Secondary | ICD-10-CM | POA: Diagnosis not present

## 2023-07-31 DIAGNOSIS — I10 Essential (primary) hypertension: Secondary | ICD-10-CM

## 2023-07-31 DIAGNOSIS — F900 Attention-deficit hyperactivity disorder, predominantly inattentive type: Secondary | ICD-10-CM

## 2023-07-31 DIAGNOSIS — E669 Obesity, unspecified: Secondary | ICD-10-CM | POA: Diagnosis not present

## 2023-07-31 NOTE — Progress Notes (Signed)
.smr  Office: [phone]  /  Fax: [phone]  WEIGHT SUMMARY AND BIOMETRICS  Anthropometric Measurements Height: 5\' 8"  (1.727 m) Weight: 193 lb (87.5 kg) BMI (Calculated): 29.35 Weight at Last Visit: 193 lb Weight Lost Since Last Visit: 0 Weight Gained Since Last Visit: 0 Starting Weight: 249 lb Total Weight Loss (lbs): 56 lb (25.4 kg)   Body Composition  Body Fat %: 42.7 % Fat Mass (lbs): 82.8 lbs Muscle Mass (lbs): 105.4 lbs Total Body Water (lbs): 78.2 lbs Visceral Fat Rating : 11   Other Clinical Data Fasting: No Labs: No Today's Visit #: 31 Starting Date: 04/05/21    Chief Complaint: OBESITY   History of Present Illness   The patient, with a history of prediabetes and obesity, presents for a follow-up visit. She reports maintaining her weight since the last visit a month ago, attributing this to adherence to a category two eating plan approximately 70% of the time. She has also increased her physical activity, engaging in gardening and walking for about 40 minutes five times per week.  The patient acknowledges challenges with hunger and cravings, particularly at night. However, she has developed strategies to manage these cravings, opting for healthier snack options such as yogurt for sweet cravings and seaweed packets for salty cravings. She reports satisfaction with these alternatives and denies the need to seek additional snacks afterwards.  The patient also discusses her cooking habits, noting that she has been adding olive oil and salad dressing to her meals. She recognizes that while these additions allow her to maintain her current weight, they hinder weight loss. She expresses understanding of the need to eliminate these extras to achieve weight loss.  The patient also mentions a change in her fruit intake, now incorporating berries and occasional apples into her diet. She reports no issues with meal preparation but struggles with managing hunger and  cravings between meals and at night.  The patient is on metoprim for prediabetes and reports improved adherence to the twice-daily dosing regimen. She expresses a desire to be stricter with her diet and medication regimen in the coming weeks to jumpstart her weight loss efforts.          PHYSICAL EXAM:  Blood pressure 124/75, pulse 72, temperature 98.1 F (36.7 C), height 5\' 8"  (1.727 m), weight 193 lb (87.5 kg), last menstrual period 05/30/2012, SpO2 97%. Body mass index is 29.35 kg/m.  DIAGNOSTIC DATA REVIEWED:  BMET    Component Value Date/Time   NA 144 05/30/2022 1301   K 4.4 05/30/2022 1301   CL 101 05/30/2022 1301   CO2 24 05/30/2022 1301   GLUCOSE 80 05/30/2022 1301   GLUCOSE 93 02/23/2020 1230   BUN 30 (H) 05/30/2022 1301   CREATININE 0.70 05/30/2022 1301   CREATININE 0.78 02/23/2020 1230   CALCIUM 9.7 05/30/2022 1301   GFRNONAA >60 02/24/2016 1150   GFRAA >60 02/24/2016 1150   Lab Results  Component Value Date   HGBA1C 5.4 02/11/2023   HGBA1C 5.8 (H) 04/05/2021   Lab Results  Component Value Date   INSULIN 5.1 05/30/2022   INSULIN 9.5 04/05/2021   Lab Results  Component Value Date   TSH 2.220 04/05/2021   CBC    Component Value Date/Time   WBC 5.0 05/30/2022 1301   WBC 5.4 02/23/2020 1230   RBC 4.49 05/30/2022 1301   RBC 4.33 02/23/2020 1230   HGB 13.8 05/30/2022 1301   HCT 41.5 05/30/2022 1301   PLT 262 05/30/2022 1301  MCV 92 05/30/2022 1301   MCH 30.7 05/30/2022 1301   MCH 30.7 02/23/2020 1230   MCHC 33.3 05/30/2022 1301   MCHC 33.0 02/23/2020 1230   RDW 11.9 05/30/2022 1301   Iron Studies No results found for: "IRON", "TIBC", "FERRITIN", "IRONPCTSAT" Lipid Panel     Component Value Date/Time   CHOL 194 05/30/2022 1301   TRIG 70 05/30/2022 1301   HDL 85 05/30/2022 1301   CHOLHDL 3.4 02/23/2020 1230   VLDL 19.0 07/30/2016 0859   LDLCALC 96 05/30/2022 1301   LDLCALC 140 (H) 02/23/2020 1230   Hepatic Function Panel     Component  Value Date/Time   PROT 6.2 05/30/2022 1301   ALBUMIN 4.4 05/30/2022 1301   AST 17 05/30/2022 1301   ALT 25 05/30/2022 1301   ALKPHOS 59 05/30/2022 1301   BILITOT 0.3 05/30/2022 1301   BILIDIR 0.0 07/30/2016 0859      Component Value Date/Time   TSH 2.220 04/05/2021 0925   Nutritional Lab Results  Component Value Date   VD25OH 70.7 05/30/2022   VD25OH 41.1 04/05/2021   VD25OH 41 01/21/2019     Assessment and Plan    Obesity Weight stable over the past month. Patient is adhering to a category two eating plan approximately 70% of the time and has increased physical activity. She experiences occasional cravings but has developed strategies to manage them with healthier alternatives. Discussed the role of neurotransmitters in cravings and the potential use of medications to assist. Patient has identified certain dietary habits that prevent weight loss and plans to be stricter with her diet in the coming weeks. -Continue current diet and exercise regimen. -Encourage consistent use of Metformin twice daily. -Consider use of medications to manage cravings if necessary. -Schedule follow-up visit on September 30th to assess progress.  Prediabetes Managed with Metformin. -Continue Metformin twice daily. -Encourage consistent use of medication.  Continue diet, exercise and weight loss to help prevent onset of diabetes.        I have personally spent 30 minutes total time today in preparation, patient care, and documentation for this visit, including the following: review of clinical lab tests; review of medical tests/procedures/services.    She was informed of the importance of frequent follow up visits to maximize her success with intensive lifestyle modifications for her multiple health conditions.    Quillian Quince, MD

## 2023-08-04 ENCOUNTER — Other Ambulatory Visit: Payer: Self-pay | Admitting: Internal Medicine

## 2023-08-04 DIAGNOSIS — Z01419 Encounter for gynecological examination (general) (routine) without abnormal findings: Secondary | ICD-10-CM

## 2023-08-04 DIAGNOSIS — Z780 Asymptomatic menopausal state: Secondary | ICD-10-CM

## 2023-08-04 DIAGNOSIS — Z13820 Encounter for screening for osteoporosis: Secondary | ICD-10-CM

## 2023-08-04 DIAGNOSIS — D061 Carcinoma in situ of exocervix: Secondary | ICD-10-CM

## 2023-08-06 ENCOUNTER — Ambulatory Visit
Admission: RE | Admit: 2023-08-06 | Discharge: 2023-08-06 | Disposition: A | Discharge status: Home / Self Care | Payer: BC Managed Care – PPO | Source: Ambulatory Visit | Attending: Internal Medicine | Admitting: Internal Medicine

## 2023-08-06 DIAGNOSIS — Z1231 Encounter for screening mammogram for malignant neoplasm of breast: Secondary | ICD-10-CM

## 2023-08-11 ENCOUNTER — Ambulatory Visit (INDEPENDENT_AMBULATORY_CARE_PROVIDER_SITE_OTHER): Payer: BC Managed Care – PPO | Admitting: Family Medicine

## 2023-08-15 ENCOUNTER — Other Ambulatory Visit: Payer: Self-pay | Admitting: Internal Medicine

## 2023-08-15 DIAGNOSIS — F900 Attention-deficit hyperactivity disorder, predominantly inattentive type: Secondary | ICD-10-CM

## 2023-08-15 NOTE — Telephone Encounter (Signed)
Prescription Request  08/15/2023  LOV: 02/11/2023  What is the name of the medication or equipment? amphetamine-dextroamphetamine (ADDERALL) 20 MG tablet   Have you contacted your pharmacy to request a refill? No   Which pharmacy would you like this sent to?  [facility] #[id number] Ginette Otto, [place] - [address] AT [facility] [address] [facility] [address] & [facility] [address] [place] [address] Phone: [phone] Fax: [phone]     Patient notified that their request is being sent to the clinical staff for review and that they should receive a response within 2 business days.   Please advise at Mobile [phone] (mobile)

## 2023-08-18 MED ORDER — AMPHETAMINE-DEXTROAMPHETAMINE 20 MG PO TABS
20.0000 mg | ORAL_TABLET | Freq: Two times a day (BID) | ORAL | 0 refills | Status: DC
Start: 2023-08-18 — End: 2023-11-03

## 2023-08-21 ENCOUNTER — Ambulatory Visit: Payer: BC Managed Care – PPO | Admitting: Internal Medicine

## 2023-08-21 ENCOUNTER — Other Ambulatory Visit (INDEPENDENT_AMBULATORY_CARE_PROVIDER_SITE_OTHER): Payer: BC Managed Care – PPO

## 2023-08-21 VITALS — BP 148/78 | HR 70 | Temp 97.6°F | Ht 68.0 in | Wt 197.0 lb

## 2023-08-21 DIAGNOSIS — Z1159 Encounter for screening for other viral diseases: Secondary | ICD-10-CM

## 2023-08-21 DIAGNOSIS — I10 Essential (primary) hypertension: Secondary | ICD-10-CM

## 2023-08-21 DIAGNOSIS — R7303 Prediabetes: Secondary | ICD-10-CM | POA: Diagnosis not present

## 2023-08-21 DIAGNOSIS — F339 Major depressive disorder, recurrent, unspecified: Secondary | ICD-10-CM

## 2023-08-21 DIAGNOSIS — Z23 Encounter for immunization: Secondary | ICD-10-CM

## 2023-08-21 DIAGNOSIS — Z114 Encounter for screening for human immunodeficiency virus [HIV]: Secondary | ICD-10-CM

## 2023-08-21 DIAGNOSIS — E559 Vitamin D deficiency, unspecified: Secondary | ICD-10-CM

## 2023-08-21 DIAGNOSIS — Z6836 Body mass index (BMI) 36.0-36.9, adult: Secondary | ICD-10-CM

## 2023-08-21 DIAGNOSIS — E66812 Obesity, class 2: Secondary | ICD-10-CM | POA: Diagnosis not present

## 2023-08-21 DIAGNOSIS — E7800 Pure hypercholesterolemia, unspecified: Secondary | ICD-10-CM | POA: Diagnosis not present

## 2023-08-21 DIAGNOSIS — F900 Attention-deficit hyperactivity disorder, predominantly inattentive type: Secondary | ICD-10-CM | POA: Diagnosis not present

## 2023-08-21 DIAGNOSIS — Z0000 Encounter for general adult medical examination without abnormal findings: Secondary | ICD-10-CM

## 2023-08-21 LAB — COMPREHENSIVE METABOLIC PANEL
ALT: 31 U/L (ref 0–35)
AST: 17 U/L (ref 0–37)
Albumin: 4.4 g/dL (ref 3.5–5.2)
Alkaline Phosphatase: 52 U/L (ref 39–117)
BUN: 26 mg/dL — ABNORMAL HIGH (ref 6–23)
CO2: 31 meq/L (ref 19–32)
Calcium: 10 mg/dL (ref 8.4–10.5)
Chloride: 104 meq/L (ref 96–112)
Creatinine, Ser: 0.75 mg/dL (ref 0.40–1.20)
GFR: 84.95 mL/min (ref >60.00)
Glucose, Bld: 92 mg/dL (ref 70–99)
Potassium: 4.2 meq/L (ref 3.5–5.1)
Sodium: 141 meq/L (ref 135–145)
Total Bilirubin: 0.5 mg/dL (ref 0.2–1.2)
Total Protein: 6.1 g/dL (ref 6.0–8.3)

## 2023-08-21 LAB — CBC WITH DIFFERENTIAL/PLATELET
Basophils Absolute: 0 10*3/uL (ref 0.0–0.1)
Basophils Relative: 0.8 % (ref 0.0–3.0)
Eosinophils Absolute: 0.2 10*3/uL (ref 0.0–0.7)
Eosinophils Relative: 3.3 % (ref 0.0–5.0)
HCT: 43.9 % (ref 36.0–46.0)
Hemoglobin: 14 g/dL (ref 12.0–15.0)
Lymphocytes Relative: 29.4 % (ref 12.0–46.0)
Lymphs Abs: 1.4 10*3/uL (ref 0.7–4.0)
MCHC: 31.9 g/dL (ref 30.0–36.0)
MCV: 95.2 fL (ref 78.0–100.0)
Monocytes Absolute: 0.5 10*3/uL (ref 0.1–1.0)
Monocytes Relative: 10.4 % (ref 3.0–12.0)
Neutro Abs: 2.6 10*3/uL (ref 1.4–7.7)
Neutrophils Relative %: 56.1 % (ref 43.0–77.0)
Platelets: 273 10*3/uL (ref 150.0–400.0)
RBC: 4.61 Mil/uL (ref 3.87–5.11)
RDW: 13.8 % (ref 11.5–15.5)
WBC: 4.7 10*3/uL (ref 4.0–10.5)

## 2023-08-21 LAB — LIPID PANEL
Cholesterol: 222 mg/dL — ABNORMAL HIGH (ref 0–200)
HDL: 100.4 mg/dL (ref >39.00)
LDL Cholesterol: 110 mg/dL — ABNORMAL HIGH (ref 0–99)
NonHDL: 121.66
Total CHOL/HDL Ratio: 2
Triglycerides: 56 mg/dL (ref 0.0–149.0)
VLDL: 11.2 mg/dL (ref 0.0–40.0)

## 2023-08-21 LAB — VITAMIN D 25 HYDROXY (VIT D DEFICIENCY, FRACTURES): VITD: 35.58 ng/mL (ref 30.00–100.00)

## 2023-08-21 LAB — TSH: TSH: 1.64 u[IU]/mL (ref 0.35–5.50)

## 2023-08-21 LAB — VITAMIN B12: Vitamin B-12: 494 pg/mL (ref 211–911)

## 2023-08-21 NOTE — Progress Notes (Signed)
Established Patient Office Visit     CC/Reason for Visit: Annual preventive exam  HPI: Misty Benson is a 62 y.o. female who is coming in today for the above mentioned reasons. Past Medical History is significant for: ADHD, hypertension, hyperlipidemia, impaired glucose tolerance, depression and obesity.  She has lost close to 60 pounds.  She is feeling well without acute concerns or complaints.  Has routine eye and dental care.  Is due for flu and COVID vaccinations.  All cancer screening is up-to-date.   Past Medical/Surgical History: Past Medical History:  Diagnosis Date   ADHD (attention deficit hyperactivity disorder)    Anxiety    Back pain    Cancer (HCC)    BASAL CELL FACE AND SHOULDERS , SQUAMOS CELL ON HAND AND MELANOMA LEFT ARM...   Cervix cancer (HCC) 1987   microinvasive   Constipation    Edema of both lower extremities    Hypertension    Joint pain    Leiomyoma 2013   multiple small   Melanoma (HCC)    Obesity    Prediabetes    Shoulder pain    Snoring     Past Surgical History:  Procedure Laterality Date   arm surgery     to remove melanoma on left upper outter arm   BUNIONECTOMY     LEFT FOOT WITH PINS   CERVICAL CONE BIOPSY  11/11/1985   CESAREAN SECTION     DILATION AND CURETTAGE OF UTERUS     x2   FOOT SURGERY     MELANOMA EXCISION     TONSILLECTOMY     as a child   TOTAL HIP ARTHROPLASTY Left 06/06/2022    Social History:  reports that she has never smoked. She has never used smokeless tobacco. She reports current alcohol use of about 4.0 standard drinks of alcohol per week. She reports that she does not use drugs.  Allergies: No Known Allergies  Family History:  Family History  Problem Relation Age of Onset   Stroke Mother    Osteoporosis Mother    Hypertension Mother    Hyperlipidemia Father    Atrial fibrillation Father    Cancer Father        Prostate   Heart defect Father    Breast cancer Maternal Grandmother 50      Current Outpatient Medications:    amphetamine-dextroamphetamine (ADDERALL) 20 MG tablet, Take 1 tablet (20 mg total) by mouth 2 (two) times daily., Disp: 60 tablet, Rfl: 0   amphetamine-dextroamphetamine (ADDERALL) 20 MG tablet, Take 1 tablet (20 mg total) by mouth 2 (two) times daily., Disp: 60 tablet, Rfl: 0   amphetamine-dextroamphetamine (ADDERALL) 20 MG tablet, Take 1 tablet (20 mg total) by mouth 2 (two) times daily., Disp: 60 tablet, Rfl: 0   Cholecalciferol (VITAMIN D3) 125 MCG (5000 UT) CAPS, Take 1 cap daily Monday through Friday and 2 caps daily Saturday and Sunday., Disp: 30 capsule, Rfl: 0   citalopram (CELEXA) 20 MG tablet, TAKE 1 TABLET(20 MG) BY MOUTH DAILY, Disp: 90 tablet, Rfl: 0   diclofenac (VOLTAREN) 50 MG EC tablet, Take 50 mg by mouth 2 (two) times daily as needed., Disp: , Rfl:    gabapentin (NEURONTIN) 300 MG capsule, Take 300 mg by mouth at bedtime as needed., Disp: , Rfl:    hydrochlorothiazide (MICROZIDE) 12.5 MG capsule, TAKE 1 CAPSULE(12.5 MG) BY MOUTH DAILY, Disp: 90 capsule, Rfl: 0   ibuprofen (ADVIL,MOTRIN) 200 MG tablet, Take 3-4 tablets (  600-800 mg total) by mouth every 6 (six) hours as needed for fever, headache, mild pain, moderate pain or cramping., Disp: 30 tablet, Rfl: 6   metFORMIN (GLUCOPHAGE) 500 MG tablet, Take 1 tablet (500 mg total) by mouth 2 (two) times daily with a meal., Disp: 60 tablet, Rfl: 0   Multiple Vitamin (MULTIVITAMIN PO), Take by mouth., Disp: , Rfl:    Omega-3 1000 MG CAPS, Take by mouth., Disp: , Rfl:    verapamil (CALAN-SR) 240 MG CR tablet, Take 1 tablet (240 mg total) by mouth daily., Disp: 90 tablet, Rfl: 1  Review of Systems:  Negative unless indicated in HPI.   Physical Exam: Vitals:   08/21/23 1300  BP: (!) 148/78  Pulse: 70  Temp: 97.6 F (36.4 C)  TempSrc: Oral  SpO2: 97%  Weight: 197 lb (89.4 kg)  Height: 5\' 8"  (1.727 m)    Body mass index is 29.95 kg/m.   Physical Exam Vitals reviewed.   Constitutional:      General: She is not in acute distress.    Appearance: Normal appearance. She is not ill-appearing, toxic-appearing or diaphoretic.  HENT:     Head: Normocephalic.     Right Ear: Tympanic membrane, ear canal and external ear normal. There is no impacted cerumen.     Left Ear: Tympanic membrane, ear canal and external ear normal. There is no impacted cerumen.     Nose: Nose normal.     Mouth/Throat:     Mouth: Mucous membranes are moist.     Pharynx: Oropharynx is clear. No oropharyngeal exudate or posterior oropharyngeal erythema.  Eyes:     General: No scleral icterus.       Right eye: No discharge.        Left eye: No discharge.     Conjunctiva/sclera: Conjunctivae normal.     Pupils: Pupils are equal, round, and reactive to light.  Neck:     Vascular: No carotid bruit.  Cardiovascular:     Rate and Rhythm: Normal rate and regular rhythm.     Pulses: Normal pulses.     Heart sounds: Normal heart sounds.  Pulmonary:     Effort: Pulmonary effort is normal. No respiratory distress.     Breath sounds: Normal breath sounds.  Abdominal:     General: Abdomen is flat. Bowel sounds are normal.     Palpations: Abdomen is soft.  Musculoskeletal:        General: Normal range of motion.     Cervical back: Normal range of motion.  Skin:    General: Skin is warm and dry.  Neurological:     General: No focal deficit present.     Mental Status: She is alert and oriented to person, place, and time. Mental status is at baseline.  Psychiatric:        Mood and Affect: Mood normal.        Behavior: Behavior normal.        Thought Content: Thought content normal.        Judgment: Judgment normal.     Flowsheet Row Office Visit from 02/11/2023 in [facility] [place]  PHQ-9 Total Score 0        Impression and Plan:  Encounter for preventive health examination  Immunization due  Attention deficit hyperactivity disorder (ADHD),  predominantly inattentive type  Primary hypertension -     CBC with Differential/Platelet; Future -     Comprehensive metabolic panel; Future  Pure hypercholesterolemia -  Lipid panel; Future  Vitamin D deficiency -     VITAMIN D 25 Hydroxy (Vit-D Deficiency, Fractures); Future  Depression, recurrent (HCC)  Prediabetes -     Hemoglobin A1c; Future  Encounter for hepatitis C screening test for low risk patient -     Hepatitis C antibody; Future  Encounter for screening for HIV -     HIV Antibody (routine testing w rflx); Future  Class 2 severe obesity due to excess calories with serious comorbidity and body mass index (BMI) of 36.0 to 36.9 in adult [facility]) -     Vitamin B12; Future -     TSH; Future   -Recommend routine eye and dental care. -Healthy lifestyle discussed in detail. -Labs to be updated today. -Prostate cancer screening: N/A Health Maintenance  Topic Date Due   HIV Screening  Never done   Hepatitis C Screening  Never done   Flu Shot  06/12/2023   COVID-19 Vaccine (4 - 2023-24 season) 07/13/2023   DTaP/Tdap/Td vaccine (3 - Td or Tdap) 11/18/2023   Mammogram  08/05/2025   Pap with HPV screening  09/06/2025   Colon Cancer Screening  11/22/2030   Zoster (Shingles) Vaccine  Completed   HPV Vaccine  Aged Out     -Flu vaccine administered in office today, she will obtain COVID vaccination at pharmacy.     Chaya Jan, MD [facility]

## 2023-08-21 NOTE — Addendum Note (Signed)
Addended by: Christy Sartorius on: 08/21/2023 01:39 PM   Modules accepted: Orders

## 2023-08-22 LAB — HIV ANTIBODY (ROUTINE TESTING W REFLEX): HIV 1&2 Ab, 4th Generation: NONREACTIVE

## 2023-08-22 LAB — HEPATITIS C ANTIBODY: Hepatitis C Ab: NONREACTIVE

## 2023-08-22 LAB — HEMOGLOBIN A1C: Hgb A1c MFr Bld: 5.6 % (ref 4.6–6.5)

## 2023-09-03 ENCOUNTER — Ambulatory Visit (INDEPENDENT_AMBULATORY_CARE_PROVIDER_SITE_OTHER): Payer: BC Managed Care – PPO | Admitting: Family Medicine

## 2023-09-03 ENCOUNTER — Encounter (INDEPENDENT_AMBULATORY_CARE_PROVIDER_SITE_OTHER): Payer: Self-pay | Admitting: Family Medicine

## 2023-09-03 VITALS — BP 113/74 | HR 64 | Temp 98.4°F | Ht 68.0 in | Wt 192.0 lb

## 2023-09-03 DIAGNOSIS — R7303 Prediabetes: Secondary | ICD-10-CM | POA: Diagnosis not present

## 2023-09-03 DIAGNOSIS — I10 Essential (primary) hypertension: Secondary | ICD-10-CM | POA: Diagnosis not present

## 2023-09-03 DIAGNOSIS — E559 Vitamin D deficiency, unspecified: Secondary | ICD-10-CM | POA: Diagnosis not present

## 2023-09-03 DIAGNOSIS — Z6829 Body mass index (BMI) 29.0-29.9, adult: Secondary | ICD-10-CM

## 2023-09-03 DIAGNOSIS — M545 Low back pain, unspecified: Secondary | ICD-10-CM | POA: Diagnosis not present

## 2023-09-03 DIAGNOSIS — G8929 Other chronic pain: Secondary | ICD-10-CM

## 2023-09-03 DIAGNOSIS — E669 Obesity, unspecified: Secondary | ICD-10-CM

## 2023-09-03 MED ORDER — VITAMIN D3 125 MCG (5000 UT) PO CAPS
ORAL_CAPSULE | ORAL | Status: DC
Start: 2023-09-03 — End: 2023-10-01

## 2023-09-03 NOTE — Progress Notes (Signed)
[name], D.O.  ABFM, ABOM Specializing in Clinical Bariatric Medicine  Office located at: [address] [place], [place]  [address]     Assessment and Plan:   Medications Discontinued During This Encounter  Medication Reason   gabapentin (NEURONTIN) 300 MG capsule    Cholecalciferol (VITAMIN D3) 125 MCG (5000 UT) CAPS     Meds ordered this encounter  Medications   Cholecalciferol (VITAMIN D3) 125 MCG (5000 UT) CAPS    Sig: Take 2 caps daily Monday through Friday and 1 cap daily Saturday and Sunday.    Prediabetes Assessment: Condition is Not at goal.. Patient reports better compliance and tolerance of taking Metformin BID. She endorses her hunger and cravings are well controlled most of the time.  She notes having less hunger and cravings when she increases her protein intake. Her A1c increased from 5.4 to 5.6 as of 08/21/2023.  Lab Results  Component Value Date   HGBA1C 5.6 08/21/2023   HGBA1C 5.4 02/11/2023   HGBA1C 5.5 05/30/2022   INSULIN 5.1 05/30/2022   INSULIN 9.5 04/05/2021    Plan: -  Continue Metformin 500mg  BID. She denies need for refill.   - Continue to decrease simple carbs/ sugars; increase fiber and proteins -> follow her meal plan.   - Explained role of simple carbs and insulin levels on hunger and cravings  - Anticipatory guidance given.    - Morganna will continue to work on weight loss, exercise, via their meal plan we devised to help decrease the risk of progressing to diabetes.    Essential hypertension Assessment: Condition is Controlled.. She endorses being on Hydrochlorothiazide and Verapamil and has been on this for years with well controlled BP. She states that the last elevated BP on 08/21/2023 that was the first time in a while her BP was elevated. She is asymptomatic.  Last 3 blood pressure readings in our office are as follows: BP Readings from Last 3 Encounters:  09/03/23 113/74  08/21/23 (!) 148/78  07/31/23 124/75   Plan:  - Lifestyle changes such as following our low salt, heart healthy meal plan and engaging in a regular exercise program discussed   - BIANCE DIERKER BP is 113/74 at goal today.   - She will get in contact with her PCP about tapering off her one or more of her BP medications.   - We will continue to monitor closely alongside PCP/ specialists.  Pt reminded to also f/up with those individuals as instructed by them.    Vitamin D deficiency Assessment: Condition is Not optimized.Marland Kitchen Pt vitamin D level has dropped from 70.7 to 35.58 as of 08/21/2023. She endorses taking a vitamin D oral supplement 5,000lU without difficulty.  Lab Results  Component Value Date   VD25OH 35.58 08/21/2023   VD25OH 70.7 05/30/2022   VD25OH 41.1 04/05/2021   Plan: - Continue taking Cholecalciferol 5,000lU but increase to twice weekly such as Saturday and Sunday.   - ideal vitamin D levels reviewed with patient    - With Winter approaching her vitamin D levels are prone to drop so we will need to monitor levels regularly to keep levels within normal limits and prevent over supplementation.  - pt's questions and concerns regarding this condition addressed.    Chronic low back pain, unspecified back pain laterality, unspecified whether sciatica present Assessment: Condition is Not optimized.. Pt has a hx of an knee injury, hip surgery, back pain, and has limitations on what exercises she can do.  She inquired about what exercises she can do to not make these pain worse and improve them. She currently engages in yard and house work regularly. Pt use to do yoga regularly.   Plan: - Provided 20+ minutes on benefits of various exercises for the body.   - Extensive education was given and discussed about various exercises that the pt can engage in to help with weight loss, improve lean muscle tissue and bone mass density to prevent osteoporosis. These exercises will also help improve flexibility, core strength, and generalize  improvement in her strength for her to better perform her day to day activities.    TREATMENT PLAN FOR OBESITY: BMI 29.0-29.9,adult Current BMI 29.2 Obesity, Beginning BMI 37.86 Assessment:  OMELIA VERMILYEA is here to discuss her progress with her obesity treatment plan along with follow-up of her obesity related diagnoses. See Medical Weight Management Flowsheet for complete bioelectrical impedance results.  Condition is improving but not optimized. Biometric data collected today, was reviewed with patient.   Since last office visit on 09/19//2024 patient's  Muscle mass has stayed the same. Fat mass has decreased by 1.2lb. Total body water has decreased by 1.8lb.  Counseling done on how various foods will affect these numbers and how to maximize success  Total lbs lost to date: 57 Total weight loss percentage to date: 22.89%   Plan: - Continue to adhere to the Category 2 meal plan.   - I recommended the [employer] group for a supportive exercise program.   - I also recommended the free A[facility] exercise program.   - I informed her that resistance training and weight bearing exercises are ideal for weight loss  Behavioral Intervention Additional resources provided today: patient declined Evidence-based interventions for health behavior change were utilized today including the discussion of self monitoring techniques, problem-solving barriers and SMART goal setting techniques.   Regarding patient's less desirable eating habits and patterns, we employed the technique of small changes.  Pt will specifically work on: Exercises outside of day to day physical  activity for next visit.     She has agreed to Think about enjoyable ways to increase daily physical activity and overcoming barriers to exercise and Increase physical activity in their day and reduce sedentary time (increase NEAT).   FOLLOW UP: Return in about 4 weeks (around 10/01/2023).  She was informed of the importance of  frequent follow up visits to maximize her success with intensive lifestyle modifications for her multiple health conditions.  Subjective:   Chief complaint: Obesity Modupe is here to discuss her progress with her obesity treatment plan. She is on the the Category 2 Plan and states she is following her eating plan approximately 70% of the time. She states she is exercising.   Interval History:  JUHEE COOLEY is here for a follow up office visit.     Since last office visit:  This pt is usually seen by Dr. Dalbert Garnet. She informed me that she does weigh herself at home. She informed me that she has been experiencing back pain which has hindered most of her physical exercise. She endorses she is "moving" throughout her day to day life. She tries to walk outside when the sun sets as she takes precautions for sun exposure due to having a history of multiple melanomas. Pt informed me that she could drink more water and increase her protein intake. She drinks a protein shake and has a protein bar occasionally for snacks.   We reviewed her  meal plan and all questions were answered.   Pharmacotherapy for weight loss: She is currently taking  Metformin  for medical weight loss.  Denies side effects.    Review of Systems:  Pertinent positives were addressed with patient today.  Reviewed by clinician on day of visit: allergies, medications, problem list, medical history, surgical history, family history, social history, and previous encounter notes.  Weight Summary and Biometrics   Weight Lost Since Last Visit: 1lb  Weight Gained Since Last Visit: 0lb   Vitals Temp: 98.4 F (36.9 C) BP: 113/74 Pulse Rate: 64 SpO2: 99 %   Anthropometric Measurements Height: 5\' 8"  (1.727 m) Weight: 192 lb (87.1 kg) BMI (Calculated): 29.2 Weight at Last Visit: 193lb Weight Lost Since Last Visit: 1lb Weight Gained Since Last Visit: 0lb Starting Weight: 249lb Total Weight Loss (lbs): 57 lb (25.9 kg)   Body  Composition  Body Fat %: 42.4 % Fat Mass (lbs): 81.6 lbs Muscle Mass (lbs): 105.4 lbs Total Body Water (lbs): 76.4 lbs Visceral Fat Rating : 11   Other Clinical Data Fasting: no Labs: no Today's Visit #: 32 Starting Date: 04/05/21     Objective:   PHYSICAL EXAM: Blood pressure 113/74, pulse 64, temperature 98.4 F (36.9 C), height 5\' 8"  (1.727 m), weight 192 lb (87.1 kg), last menstrual period 05/30/2012, SpO2 99%. Body mass index is 29.19 kg/m.  General: Well Developed, well nourished, and in no acute distress.  HEENT: Normocephalic, atraumatic Skin: Warm and dry, cap RF less 2 sec, good turgor Chest:  Normal excursion, shape, no gross abn Respiratory: speaking in full sentences, no conversational dyspnea NeuroM-Sk: Ambulates w/o assistance, moves * 4 Psych: A and O *3, insight good, mood-full  DIAGNOSTIC DATA REVIEWED:  BMET    Component Value Date/Time   NA 141 08/21/2023 1401   NA 144 05/30/2022 1301   K 4.2 08/21/2023 1401   CL 104 08/21/2023 1401   CO2 31 08/21/2023 1401   GLUCOSE 92 08/21/2023 1401   BUN 26 (H) 08/21/2023 1401   BUN 30 (H) 05/30/2022 1301   CREATININE 0.75 08/21/2023 1401   CREATININE 0.78 02/23/2020 1230   CALCIUM 10.0 08/21/2023 1401   GFRNONAA >60 02/24/2016 1150   GFRAA >60 02/24/2016 1150   Lab Results  Component Value Date   HGBA1C 5.6 08/21/2023   HGBA1C 5.8 (H) 04/05/2021   Lab Results  Component Value Date   INSULIN 5.1 05/30/2022   INSULIN 9.5 04/05/2021   Lab Results  Component Value Date   TSH 1.64 08/21/2023   CBC    Component Value Date/Time   WBC 4.7 08/21/2023 1401   RBC 4.61 08/21/2023 1401   HGB 14.0 08/21/2023 1401   HGB 13.8 05/30/2022 1301   HCT 43.9 08/21/2023 1401   HCT 41.5 05/30/2022 1301   PLT 273.0 08/21/2023 1401   PLT 262 05/30/2022 1301   MCV 95.2 08/21/2023 1401   MCV 92 05/30/2022 1301   MCH 30.7 05/30/2022 1301   MCH 30.7 02/23/2020 1230   MCHC 31.9 08/21/2023 1401   RDW 13.8  08/21/2023 1401   RDW 11.9 05/30/2022 1301   Iron Studies No results found for: "IRON", "TIBC", "FERRITIN", "IRONPCTSAT" Lipid Panel     Component Value Date/Time   CHOL 222 (H) 08/21/2023 1401   CHOL 194 05/30/2022 1301   TRIG 56.0 08/21/2023 1401   HDL 100.40 08/21/2023 1401   HDL 85 05/30/2022 1301   CHOLHDL 2 08/21/2023 1401   VLDL 11.2 08/21/2023 1401  LDLCALC 110 (H) 08/21/2023 1401   LDLCALC 96 05/30/2022 1301   LDLCALC 140 (H) 02/23/2020 1230   Hepatic Function Panel     Component Value Date/Time   PROT 6.1 08/21/2023 1401   PROT 6.2 05/30/2022 1301   ALBUMIN 4.4 08/21/2023 1401   ALBUMIN 4.4 05/30/2022 1301   AST 17 08/21/2023 1401   ALT 31 08/21/2023 1401   ALKPHOS 52 08/21/2023 1401   BILITOT 0.5 08/21/2023 1401   BILITOT 0.3 05/30/2022 1301   BILIDIR 0.0 07/30/2016 0859      Component Value Date/Time   TSH 1.64 08/21/2023 1401   Nutritional Lab Results  Component Value Date   VD25OH 35.58 08/21/2023   VD25OH 70.7 05/30/2022   VD25OH 41.1 04/05/2021    Attestations:   This encounter took 57 total minutes of time including any pre-visit and post-visit time spent on this date of service, including taking a thorough history, reviewing any labs and/or imaging, reviewing prior notes, as well as documenting in the electronic health record on the date of service. Over 50% of that time was in direct face-to-face counseling and coordinating care for the patient today  I, Clinical biochemist, acting as a Stage manager for [employer], DO., have compiled all relevant documentation for today's office visit on behalf of Thomasene Lot, DO, while in the presence of [employer], DO.  I have reviewed the above documentation for accuracy and completeness, and I agree with the above. [name], D.O.  The 21st Century Cures Act was signed into law in 2016 which includes the topic of electronic health records.  This provides immediate access to information  in MyChart.  This includes consultation notes, operative notes, office notes, lab results and pathology reports.  If you have any questions about what you read please let us know at your next visit so we can discuss your concerns and take corrective action if need be.  We are right here with you.

## 2023-09-08 ENCOUNTER — Other Ambulatory Visit: Payer: Self-pay | Admitting: Internal Medicine

## 2023-09-08 DIAGNOSIS — I10 Essential (primary) hypertension: Secondary | ICD-10-CM

## 2023-09-08 DIAGNOSIS — D2272 Melanocytic nevi of left lower limb, including hip: Secondary | ICD-10-CM | POA: Diagnosis not present

## 2023-09-08 DIAGNOSIS — Z85820 Personal history of malignant melanoma of skin: Secondary | ICD-10-CM | POA: Diagnosis not present

## 2023-09-08 DIAGNOSIS — D225 Melanocytic nevi of trunk: Secondary | ICD-10-CM | POA: Diagnosis not present

## 2023-09-08 DIAGNOSIS — L570 Actinic keratosis: Secondary | ICD-10-CM | POA: Diagnosis not present

## 2023-09-08 DIAGNOSIS — Z85828 Personal history of other malignant neoplasm of skin: Secondary | ICD-10-CM | POA: Diagnosis not present

## 2023-09-08 DIAGNOSIS — L821 Other seborrheic keratosis: Secondary | ICD-10-CM | POA: Diagnosis not present

## 2023-09-08 DIAGNOSIS — D485 Neoplasm of uncertain behavior of skin: Secondary | ICD-10-CM | POA: Diagnosis not present

## 2023-09-10 ENCOUNTER — Ambulatory Visit: Payer: BC Managed Care – PPO | Admitting: Obstetrics & Gynecology

## 2023-09-25 DIAGNOSIS — D485 Neoplasm of uncertain behavior of skin: Secondary | ICD-10-CM | POA: Diagnosis not present

## 2023-09-25 DIAGNOSIS — L988 Other specified disorders of the skin and subcutaneous tissue: Secondary | ICD-10-CM | POA: Diagnosis not present

## 2023-09-26 ENCOUNTER — Encounter: Payer: Self-pay | Admitting: Internal Medicine

## 2023-09-30 ENCOUNTER — Ambulatory Visit: Payer: BC Managed Care – PPO | Admitting: Obstetrics and Gynecology

## 2023-10-01 ENCOUNTER — Ambulatory Visit (INDEPENDENT_AMBULATORY_CARE_PROVIDER_SITE_OTHER): Payer: BC Managed Care – PPO | Admitting: Family Medicine

## 2023-10-01 ENCOUNTER — Encounter (INDEPENDENT_AMBULATORY_CARE_PROVIDER_SITE_OTHER): Payer: Self-pay | Admitting: Family Medicine

## 2023-10-01 VITALS — BP 142/82 | HR 68 | Temp 97.3°F | Ht 68.0 in | Wt 190.0 lb

## 2023-10-01 DIAGNOSIS — I10 Essential (primary) hypertension: Secondary | ICD-10-CM

## 2023-10-01 DIAGNOSIS — E559 Vitamin D deficiency, unspecified: Secondary | ICD-10-CM | POA: Diagnosis not present

## 2023-10-01 DIAGNOSIS — Z6828 Body mass index (BMI) 28.0-28.9, adult: Secondary | ICD-10-CM | POA: Diagnosis not present

## 2023-10-01 DIAGNOSIS — E669 Obesity, unspecified: Secondary | ICD-10-CM | POA: Diagnosis not present

## 2023-10-01 DIAGNOSIS — R7303 Prediabetes: Secondary | ICD-10-CM

## 2023-10-01 MED ORDER — VITAMIN D3 125 MCG (5000 UT) PO CAPS: ORAL_CAPSULE | ORAL | Status: AC

## 2023-10-01 MED ORDER — METFORMIN HCL 500 MG PO TABS
500.0000 mg | ORAL_TABLET | Freq: Two times a day (BID) | ORAL | 0 refills | Status: DC
Start: 2023-10-01 — End: 2023-12-11

## 2023-10-01 NOTE — Progress Notes (Signed)
[name], D.O.  ABFM, ABOM Specializing in Clinical Bariatric Medicine  Office located at: [address] [place], [place]  [address]   Assessment and Plan:   FOR THE DISEASE OF OBESITY: BMI 28.0-28.9,adult- current 28.9 Obesity, Beginning BMI 37.86 Since last office visit on 09/03/2023 patient's  Muscle mass has increased by 0.6lb. Fat mass has decreased by 3lb. Total body water has decreased by 1lb.  Counseling done on how various foods will affect these numbers and how to maximize success  Total lbs lost to date: 59 Total weight loss percentage to date: 23.69%    Recommended Dietary Goals Shanin is currently in the action stage of change. As such, her goal is to continue weight management plan.  She has agreed to: continue current plan   Behavioral Intervention We discussed the following Behavioral Modification Strategies today: practice mindfulness eating and understand the difference between hunger signals and cravings, continue to work on implementation of reduced calorie nutritional plan, continue to practice mindfulness when eating, and planning for success  Additional resources provided today: None  Evidence-based interventions for health behavior change were utilized today including the discussion of self monitoring techniques, problem-solving barriers and SMART goal setting techniques.   Regarding patient's less desirable eating habits and patterns, we employed the technique of small changes.   Pt will specifically work on: Continue to increase walking in distance and intensity for next visit.    Recommended Physical Activity Goals Eliannah has been advised to work up to 150 minutes of moderate intensity aerobic activity a week and strengthening exercises 2-3 times per week for cardiovascular health, weight loss maintenance and preservation of muscle mass.   She has agreed to :  Continue current level of physical activity  and continue to gradually  increase the amount and intensity of exercise    Pharmacotherapy We discussed various medication options to help Doe with her weight loss efforts and we both agreed to : continue with nutritional and behavioral strategies and continue current anti-obesity medication regimen   FOR ASSOCIATED CONDITIONS ADDRESSED TODAY: Vitamin D deficiency Assessment: Condition is Not at goal.. Pt is now taking OTC Cholecalciferol 5,000lU twice weekly instead of once. Since this change she denies any adverse or harmful side effects. She had labs done by her PCP on 08/21/2023 which showed a drop from 70.7 to 35.58.  Lab Results  Component Value Date   VD25OH 35.58 08/21/2023   VD25OH 70.7 05/30/2022   VD25OH 41.1 04/05/2021   Plan: - Continue OTC Cholecalciferol 10,000 Monday-Friday and 5,000lU Saturday and Sunday. I will refill.   - I discussed the importance of vitamin D to the patient's health and well-being as well as to their ability to lose weight.   - ideal vitamin D levels reviewed with patient.  -  We will continue to monitor levels regularly to keep levels within normal limits and prevent over supplementation. Vitamin D deficiency -     Vitamin D3; Take 2 caps daily Monday through Friday and 1 cap daily Saturday and Sunday.   Pre-diabetes Assessment: Condition is Not optimized.. Pt continues to take Metformin BID. She notes that since starting this has helped some of her carb cravings but does not feel a major change in appetite suppression.  Lab Results  Component Value Date   HGBA1C 5.6 08/21/2023   HGBA1C 5.4 02/11/2023   HGBA1C 5.5 05/30/2022   INSULIN 5.1 05/30/2022   INSULIN 9.5 04/05/2021    Plan: - Continue Metformin at  current dose twice daily. I will refill.    - Continue her prudent nutritional plan that is low in simple carbohydrates, saturated fats and trans fats to goal of 5-10% weight loss to achieve significant health benefits.  Pt encouraged to continually advance  exercise and cardiovascular fitness as tolerated throughout weight loss journey.  - Anticipatory guidance given.    - We will recheck A1c and fasting insulin level in approximately 3 months from last check, or as deemed appropriate.  Pre-diabetes -     metFORMIN HCl; Take 1 tablet (500 mg total) by mouth 2 (two) times daily with a meal.  Dispense: 60 tablet; Refill: 0  FOLLOW UP: Return in about 4 weeks (around 10/29/2023). She was informed of the importance of frequent follow up visits to maximize her success with intensive lifestyle modifications for her multiple health conditions.  Subjective:   Chief complaint: Obesity Shahzoda is here to discuss her progress with her obesity treatment plan. She is on the the Category 2 Plan and states she is following her eating plan approximately 75-80% of the time. She states she is walking 30 minutes 5-6 days per week.  Interval History:  CARMILITA KRZYWICKI is here for a follow up office visit. Since last OV,  she had been well. She informed me that she has not walked as much as she would like due to rain and cold weather. She endorses wanting to do longer walks. Pt recently had a mole resected on her left calf as she has a hx of melanoma. She recently toured a gym for a Psychologist, occupational. She informed me that she has a trip coming up in March and would like to be in better shape by then.    Barriers identified: lack of time for self-care, work schedule, and personal schedule .   Pharmacotherapy for weight loss: She is currently taking Metformin (off label use for incretin effect and / or insulin resistance and / or diabetes prevention) with adequate clinical response  and without side effects..   Review of Systems:  Pertinent positives were addressed with patient today.  Reviewed by clinician on day of visit: allergies, medications, problem list, medical history, surgical history, family history, social history, and previous encounter notes.  Weight  Summary and Biometrics   Weight Lost Since Last Visit: 2lb  Weight Gained Since Last Visit: 0lb    Vitals Temp: (!) 97.3 F (36.3 C) BP: (!) 142/82 Pulse Rate: 68 SpO2: 98 %   Anthropometric Measurements Height: 5\' 8"  (1.727 m) Weight: 190 lb (86.2 kg) BMI (Calculated): 28.9 Weight at Last Visit: 192lb Weight Lost Since Last Visit: 2lb Weight Gained Since Last Visit: 0lb Starting Weight: 249lb Total Weight Loss (lbs): 59 lb (26.8 kg)   Body Composition  Body Fat %: 41.3 % Fat Mass (lbs): 78.6 lbs Muscle Mass (lbs): 106 lbs Total Body Water (lbs): 75.4 lbs Visceral Fat Rating : 10   Other Clinical Data Fasting: no Labs: no Today's Visit #: 33 Starting Date: 04/05/21    Objective:   PHYSICAL EXAM: Blood pressure (!) 142/82, pulse 68, temperature (!) 97.3 F (36.3 C), height 5\' 8"  (1.727 m), weight 190 lb (86.2 kg), last menstrual period 05/30/2012, SpO2 98%. Body mass index is 28.89 kg/m.  General: she is overweight, cooperative and in no acute distress. PSYCH: Has normal mood, affect and thought process.   HEENT: EOMI, sclerae are anicteric. Lungs: Normal breathing effort, no conversational dyspnea. Extremities: Moves * 4 Neurologic: A and O *  3, good insight  DIAGNOSTIC DATA REVIEWED: BMET    Component Value Date/Time   NA 141 08/21/2023 1401   NA 144 05/30/2022 1301   K 4.2 08/21/2023 1401   CL 104 08/21/2023 1401   CO2 31 08/21/2023 1401   GLUCOSE 92 08/21/2023 1401   BUN 26 (H) 08/21/2023 1401   BUN 30 (H) 05/30/2022 1301   CREATININE 0.75 08/21/2023 1401   CREATININE 0.78 02/23/2020 1230   CALCIUM 10.0 08/21/2023 1401   GFRNONAA >60 02/24/2016 1150   GFRAA >60 02/24/2016 1150   Lab Results  Component Value Date   HGBA1C 5.6 08/21/2023   HGBA1C 5.8 (H) 04/05/2021   Lab Results  Component Value Date   INSULIN 5.1 05/30/2022   INSULIN 9.5 04/05/2021   Lab Results  Component Value Date   TSH 1.64 08/21/2023   CBC    Component  Value Date/Time   WBC 4.7 08/21/2023 1401   RBC 4.61 08/21/2023 1401   HGB 14.0 08/21/2023 1401   HGB 13.8 05/30/2022 1301   HCT 43.9 08/21/2023 1401   HCT 41.5 05/30/2022 1301   PLT 273.0 08/21/2023 1401   PLT 262 05/30/2022 1301   MCV 95.2 08/21/2023 1401   MCV 92 05/30/2022 1301   MCH 30.7 05/30/2022 1301   MCH 30.7 02/23/2020 1230   MCHC 31.9 08/21/2023 1401   RDW 13.8 08/21/2023 1401   RDW 11.9 05/30/2022 1301   Iron Studies No results found for: "IRON", "TIBC", "FERRITIN", "IRONPCTSAT" Lipid Panel     Component Value Date/Time   CHOL 222 (H) 08/21/2023 1401   CHOL 194 05/30/2022 1301   TRIG 56.0 08/21/2023 1401   HDL 100.40 08/21/2023 1401   HDL 85 05/30/2022 1301   CHOLHDL 2 08/21/2023 1401   VLDL 11.2 08/21/2023 1401   LDLCALC 110 (H) 08/21/2023 1401   LDLCALC 96 05/30/2022 1301   LDLCALC 140 (H) 02/23/2020 1230   Hepatic Function Panel     Component Value Date/Time   PROT 6.1 08/21/2023 1401   PROT 6.2 05/30/2022 1301   ALBUMIN 4.4 08/21/2023 1401   ALBUMIN 4.4 05/30/2022 1301   AST 17 08/21/2023 1401   ALT 31 08/21/2023 1401   ALKPHOS 52 08/21/2023 1401   BILITOT 0.5 08/21/2023 1401   BILITOT 0.3 05/30/2022 1301   BILIDIR 0.0 07/30/2016 0859      Component Value Date/Time   TSH 1.64 08/21/2023 1401   Nutritional Lab Results  Component Value Date   VD25OH 35.58 08/21/2023   VD25OH 70.7 05/30/2022   VD25OH 41.1 04/05/2021    Attestations:   I, Clinical biochemist, acting as a Stage manager for [employer], DO., have compiled all relevant documentation for today's office visit on behalf of Thomasene Lot, DO, while in the presence of [employer], DO.  Reviewed by clinician on day of visit: allergies, medications, problem list, medical history, surgical history, family history, social history, and previous encounter notes pertinent to patient's obesity diagnosis. preparing to see patient (e.g. review and interpretation of tests, old notes  ), obtaining and/or reviewing separately obtained history, performing a medically appropriate examination or evaluation, counseling and educating the patient, ordering medications, test or procedures, documenting clinical information in the electronic or other health care record, and independently interpreting results and communicating results to the patient, family, or caregiver   I have reviewed the above documentation for accuracy and completeness, and I agree with the above. [name], D.O.  The 21st Century Cures Act was signed into  law in 2016 which includes the topic of electronic health records.  This provides immediate access to information in MyChart.  This includes consultation notes, operative notes, office notes, lab results and pathology reports.  If you have any questions about what you read please let us know at your next visit so we can discuss your concerns and take corrective action if need be.  We are right here with you.

## 2023-10-30 ENCOUNTER — Ambulatory Visit (INDEPENDENT_AMBULATORY_CARE_PROVIDER_SITE_OTHER): Payer: BC Managed Care – PPO | Admitting: Family Medicine

## 2023-11-03 ENCOUNTER — Other Ambulatory Visit: Payer: Self-pay

## 2023-11-03 DIAGNOSIS — F900 Attention-deficit hyperactivity disorder, predominantly inattentive type: Secondary | ICD-10-CM

## 2023-11-03 NOTE — Addendum Note (Signed)
Addended by: Kern Reap B on: 11/03/2023 11:11 AM   Modules accepted: Orders

## 2023-11-03 NOTE — Telephone Encounter (Signed)
Copied from CRM [id number]. Topic: Clinical - Medication Refill >> Nov 03, 2023 10:17 AM Cassiday T wrote: Most Recent Primary Care Visit:  Provider: Thomasene Lot  Department: [facility] WEIGHT [facility]GT  Visit Type: FOLLOW UP  Date: 10/01/2023  Medication: amphetamine-dextroamphetamine (ADDERALL) 20 MG tablet [[id number]]  Has the patient contacted their pharmacy? Yes (Agent: If no, request that the patient contact the pharmacy for the refill. If patient does not wish to contact the pharmacy document the reason why and proceed with request.) (Agent: If yes, when and what did the pharmacy advise?)  Is this the correct pharmacy for this prescription? Yes If no, delete pharmacy and type the correct one.  This is the patient's preferred pharmacy:  [id number] [place], [place] - [address] AT [facility] [address] [facility] [address] [facility] [address] [place] [address] Phone: [phone] Fax: [phone]   Has the prescription been filled recently? Yes  Is the patient out of the medication? Yes  Has the patient been seen for an appointment in the last year OR does the patient have an upcoming appointment? Yes  Can we respond through MyChart? No  Agent: Please be advised that Rx refills may take up to 3 business days. We ask that you follow-up with your pharmacy.

## 2023-11-09 ENCOUNTER — Other Ambulatory Visit (INDEPENDENT_AMBULATORY_CARE_PROVIDER_SITE_OTHER): Payer: Self-pay | Admitting: Family Medicine

## 2023-11-09 DIAGNOSIS — R7303 Prediabetes: Secondary | ICD-10-CM

## 2023-11-10 MED ORDER — AMPHETAMINE-DEXTROAMPHETAMINE 20 MG PO TABS
20.0000 mg | ORAL_TABLET | Freq: Two times a day (BID) | ORAL | 0 refills | Status: DC
Start: 2023-11-10 — End: 2023-12-16

## 2023-11-10 MED ORDER — AMPHETAMINE-DEXTROAMPHETAMINE 20 MG PO TABS: 20.0000 mg | ORAL_TABLET | Freq: Two times a day (BID) | ORAL | 0 refills | Status: DC

## 2023-12-08 DIAGNOSIS — M898X7 Other specified disorders of bone, ankle and foot: Secondary | ICD-10-CM | POA: Diagnosis not present

## 2023-12-11 ENCOUNTER — Ambulatory Visit (INDEPENDENT_AMBULATORY_CARE_PROVIDER_SITE_OTHER): Payer: BC Managed Care – PPO | Admitting: Family Medicine

## 2023-12-11 ENCOUNTER — Other Ambulatory Visit: Payer: Self-pay | Admitting: Internal Medicine

## 2023-12-11 ENCOUNTER — Encounter (INDEPENDENT_AMBULATORY_CARE_PROVIDER_SITE_OTHER): Payer: Self-pay | Admitting: Family Medicine

## 2023-12-11 VITALS — BP 132/77 | HR 77 | Temp 98.8°F | Ht 68.0 in | Wt 195.0 lb

## 2023-12-11 DIAGNOSIS — G4700 Insomnia, unspecified: Secondary | ICD-10-CM

## 2023-12-11 DIAGNOSIS — F439 Reaction to severe stress, unspecified: Secondary | ICD-10-CM

## 2023-12-11 DIAGNOSIS — R7303 Prediabetes: Secondary | ICD-10-CM | POA: Diagnosis not present

## 2023-12-11 DIAGNOSIS — E559 Vitamin D deficiency, unspecified: Secondary | ICD-10-CM | POA: Diagnosis not present

## 2023-12-11 DIAGNOSIS — I10 Essential (primary) hypertension: Secondary | ICD-10-CM

## 2023-12-11 DIAGNOSIS — Z6829 Body mass index (BMI) 29.0-29.9, adult: Secondary | ICD-10-CM

## 2023-12-11 DIAGNOSIS — G4709 Other insomnia: Secondary | ICD-10-CM | POA: Insufficient documentation

## 2023-12-11 DIAGNOSIS — E669 Obesity, unspecified: Secondary | ICD-10-CM

## 2023-12-11 MED ORDER — METFORMIN HCL 500 MG PO TABS: 500.0000 mg | ORAL_TABLET | Freq: Two times a day (BID) | ORAL | 0 refills | Status: DC

## 2023-12-11 NOTE — Progress Notes (Signed)
.[facility]  Office: [phone]  /  Fax: [phone]  WEIGHT SUMMARY AND BIOMETRICS  Anthropometric Measurements Height: 5\' 8"  (1.727 m) Weight: 195 lb (88.5 kg) BMI (Calculated): 29.66 Weight at Last Visit: 190 lb Weight Lost Since Last Visit: 0 Weight Gained Since Last Visit: 5 lb Starting Weight: 249 lb Total Weight Loss (lbs): 54 lb (24.5 kg)   Body Composition  Body Fat %: 42.8 % Fat Mass (lbs): 83.6 lbs Muscle Mass (lbs): 106.2 lbs Total Body Water (lbs): 76 lbs Visceral Fat Rating : 11   Other Clinical Data Fasting: no Labs: no Today's Visit #: 34 Starting Date: 04/05/21    Chief Complaint: OBESITY   History of Present Illness   The patient presents with obesity, prediabetes, and vitamin D deficiency.  She has been working on diet, exercise, and weight loss but has struggled, gaining five pounds over the last two months, which included the holiday season. She follows her category two plan about thirty percent of the time and exercises by walking for thirty minutes twice a week. Stress and routine disruption are contributing factors to her weight gain.  She is on metformin for her prediabetes and requests a refill. Her most recent labs from October showed an A1c of 5.6. She has not been taking metformin regularly due to being busy and forgetting, which she feels has disrupted her routine. She experiences increased hunger and stress eating, particularly during the holidays.  Her vitamin D level in October was low at 35. She is taking over-the-counter vitamin D, 50,000 IU, two pills Monday through Friday and one pill on Saturday and Sunday.  She reports stress related to planning a trip to [place] in March, which involves coordinating travel logistics and preparing physically for hiking. She has been experiencing back pain, which she attributes to her mattress and has been seeing a rolfer for deep tissue massage, which she finds helpful. She has also been adjusting her  mattress padding to alleviate discomfort. She mentions stress from family responsibilities and planning, including managing her son's needs and preparing for travel. She feels overwhelmed with various tasks, including updating wills and managing travel preparations.          PHYSICAL EXAM:  Blood pressure 132/77, pulse 77, temperature 98.8 F (37.1 C), height 5\' 8"  (1.727 m), weight 195 lb (88.5 kg), last menstrual period 05/30/2012, SpO2 99%. Body mass index is 29.65 kg/m.  DIAGNOSTIC DATA REVIEWED:  BMET    Component Value Date/Time   NA 141 08/21/2023 1401   NA 144 05/30/2022 1301   K 4.2 08/21/2023 1401   CL 104 08/21/2023 1401   CO2 31 08/21/2023 1401   GLUCOSE 92 08/21/2023 1401   BUN 26 (H) 08/21/2023 1401   BUN 30 (H) 05/30/2022 1301   CREATININE 0.75 08/21/2023 1401   CREATININE 0.78 02/23/2020 1230   CALCIUM 10.0 08/21/2023 1401   GFRNONAA >60 02/24/2016 1150   GFRAA >60 02/24/2016 1150   Lab Results  Component Value Date   HGBA1C 5.6 08/21/2023   HGBA1C 5.8 (H) 04/05/2021   Lab Results  Component Value Date   INSULIN 5.1 05/30/2022   INSULIN 9.5 04/05/2021   Lab Results  Component Value Date   TSH 1.64 08/21/2023   CBC    Component Value Date/Time   WBC 4.7 08/21/2023 1401   RBC 4.61 08/21/2023 1401   HGB 14.0 08/21/2023 1401   HGB 13.8 05/30/2022 1301   HCT 43.9 08/21/2023 1401   HCT 41.5 05/30/2022 1301  PLT 273.0 08/21/2023 1401   PLT 262 05/30/2022 1301   MCV 95.2 08/21/2023 1401   MCV 92 05/30/2022 1301   MCH 30.7 05/30/2022 1301   MCH 30.7 02/23/2020 1230   MCHC 31.9 08/21/2023 1401   RDW 13.8 08/21/2023 1401   RDW 11.9 05/30/2022 1301   Iron Studies No results found for: "IRON", "TIBC", "FERRITIN", "IRONPCTSAT" Lipid Panel     Component Value Date/Time   CHOL 222 (H) 08/21/2023 1401   CHOL 194 05/30/2022 1301   TRIG 56.0 08/21/2023 1401   HDL 100.40 08/21/2023 1401   HDL 85 05/30/2022 1301   CHOLHDL 2 08/21/2023 1401   VLDL  11.2 08/21/2023 1401   LDLCALC 110 (H) 08/21/2023 1401   LDLCALC 96 05/30/2022 1301   LDLCALC 140 (H) 02/23/2020 1230   Hepatic Function Panel     Component Value Date/Time   PROT 6.1 08/21/2023 1401   PROT 6.2 05/30/2022 1301   ALBUMIN 4.4 08/21/2023 1401   ALBUMIN 4.4 05/30/2022 1301   AST 17 08/21/2023 1401   ALT 31 08/21/2023 1401   ALKPHOS 52 08/21/2023 1401   BILITOT 0.5 08/21/2023 1401   BILITOT 0.3 05/30/2022 1301   BILIDIR 0.0 07/30/2016 0859      Component Value Date/Time   TSH 1.64 08/21/2023 1401   Nutritional Lab Results  Component Value Date   VD25OH 35.58 08/21/2023   VD25OH 70.7 05/30/2022   VD25OH 41.1 04/05/2021     Assessment and Plan    Obesity Gained five pounds over the last two months, partly due to holiday season, stress, and routine disruption. Following category two plan 30% of the time and walking 30 minutes twice weekly. Preparing for a hiking trip in [place] in March. - Encourage adherence to diet and exercise plan - Increase physical activity, including hiking and wearing ankle weights - Follow up in three weeks for accountability  Prediabetes On metformin but not taking it regularly due to being busy and forgetting. Most recent A1c in October was 5.6%. Discussed the importance of medication adherence and benefits of metformin. - Refill metformin prescription - Advise taking metformin regularly - Write a 90-day supply prescription and advise requesting smaller bottles from the pharmacy  Vitamin D Deficiency Most recent vitamin D level in October was 35. Currently on over-the-counter vitamin D 50,000 IU, prescribed as two pills Monday through Friday and one pill on Saturday and Sunday. Discussed the importance of continuing the current regimen. - Continue current vitamin D regimen - Recheck vitamin D levels at a later date  Stress Preparing for a trip to [place] and experiencing stress related to planning and packing. Concerned  about managing medications while traveling. Discussed stress management techniques and the importance of preparation to reduce anxiety. - Advise obtaining COVID tests and antiviral medication from primary care physician - Recommend electrolyte packets for hydration during the trip   Insomnia - Recommend magnesium citrate with melatonin for sleep and constipation  Follow-up - Schedule follow-up appointment in three weeks - Advise sending MyChart message if any issues arise before the next appointment.         I have personally spent 45 minutes total time today in preparation, patient care, and documentation for this visit, including the following: review of clinical lab tests; review of medical tests/procedures/services.    She was informed of the importance of frequent follow up visits to maximize her success with intensive lifestyle modifications for her multiple health conditions.    Quillian Quince, MD

## 2023-12-16 ENCOUNTER — Ambulatory Visit (INDEPENDENT_AMBULATORY_CARE_PROVIDER_SITE_OTHER): Payer: BC Managed Care – PPO | Admitting: Internal Medicine

## 2023-12-16 VITALS — BP 130/80 | HR 70 | Temp 98.1°F | Wt 201.9 lb

## 2023-12-16 DIAGNOSIS — F900 Attention-deficit hyperactivity disorder, predominantly inattentive type: Secondary | ICD-10-CM | POA: Diagnosis not present

## 2023-12-16 DIAGNOSIS — R7303 Prediabetes: Secondary | ICD-10-CM

## 2023-12-16 DIAGNOSIS — E559 Vitamin D deficiency, unspecified: Secondary | ICD-10-CM

## 2023-12-16 DIAGNOSIS — I10 Essential (primary) hypertension: Secondary | ICD-10-CM

## 2023-12-16 DIAGNOSIS — E7800 Pure hypercholesterolemia, unspecified: Secondary | ICD-10-CM

## 2023-12-16 LAB — POCT GLYCOSYLATED HEMOGLOBIN (HGB A1C): Hemoglobin A1C: 5.4 % (ref 4.0–5.6)

## 2023-12-16 MED ORDER — HYDROCHLOROTHIAZIDE 12.5 MG PO CAPS: 12.5000 mg | ORAL_CAPSULE | Freq: Every day | ORAL | 0 refills | Status: DC

## 2023-12-16 MED ORDER — DICLOFENAC SODIUM 50 MG PO TBEC: 50.0000 mg | DELAYED_RELEASE_TABLET | Freq: Two times a day (BID) | ORAL | 0 refills | Status: DC | PRN

## 2023-12-16 MED ORDER — CITALOPRAM HYDROBROMIDE 20 MG PO TABS: ORAL_TABLET | ORAL | 0 refills | Status: DC

## 2023-12-16 MED ORDER — AMPHETAMINE-DEXTROAMPHETAMINE 20 MG PO TABS: 20.0000 mg | ORAL_TABLET | Freq: Two times a day (BID) | ORAL | 0 refills | Status: DC

## 2023-12-16 NOTE — Progress Notes (Signed)
 Established Patient Office Visit     CC/Reason for Visit: Medication refills  HPI: Misty Benson is a 63 y.o. female who is coming in today for the above mentioned reasons. Past Medical History is significant for: ADHD, hypertension, hyperlipidemia, impaired glucose tolerance, obesity.  She will be traveling to [place] in March on a hiking trip.  She is concerned about traveling with her medications and the appropriate way to label them.  Wondering what OTC medication to take with her.   Past Medical/Surgical History: Past Medical History:  Diagnosis Date   ADHD (attention deficit hyperactivity disorder)    Anxiety    Back pain    Cancer (HCC)    BASAL CELL FACE AND SHOULDERS , SQUAMOS CELL ON HAND AND MELANOMA LEFT ARM...   Cervix cancer (HCC) 1987   microinvasive   Constipation    Edema of both lower extremities    Hypertension    Joint pain    Leiomyoma 2013   multiple small   Melanoma (HCC)    Obesity    Prediabetes    Shoulder pain    Snoring     Past Surgical History:  Procedure Laterality Date   arm surgery     to remove melanoma on left upper outter arm   BUNIONECTOMY     LEFT FOOT WITH PINS   CERVICAL CONE BIOPSY  11/11/1985   CESAREAN SECTION     DILATION AND CURETTAGE OF UTERUS     x2   FOOT SURGERY     MELANOMA EXCISION     TONSILLECTOMY     as a child   TOTAL HIP ARTHROPLASTY Left 06/06/2022    Social History:  reports that she has never smoked. She has never used smokeless tobacco. She reports current alcohol use of about 4.0 standard drinks of alcohol per week. She reports that she does not use drugs.  Allergies: No Known Allergies  Family History:  Family History  Problem Relation Age of Onset   Stroke Mother    Osteoporosis Mother    Hypertension Mother    Hyperlipidemia Father    Atrial fibrillation Father    Cancer Father        Prostate   Heart defect Father    Breast cancer Maternal Grandmother 59     Current  Outpatient Medications:    Cholecalciferol (VITAMIN D3) 125 MCG (5000 UT) CAPS, Take 2 caps daily Monday through Friday and 1 cap daily Saturday and Sunday., Disp: , Rfl:    ibuprofen  (ADVIL ,MOTRIN ) 200 MG tablet, Take 3-4 tablets (600-800 mg total) by mouth every 6 (six) hours as needed for fever, headache, mild pain, moderate pain or cramping., Disp: 30 tablet, Rfl: 6   metFORMIN  (GLUCOPHAGE ) 500 MG tablet, Take 1 tablet (500 mg total) by mouth 2 (two) times daily with a meal., Disp: 180 tablet, Rfl: 0   Multiple Vitamin (MULTIVITAMIN PO), Take by mouth., Disp: , Rfl:    Omega-3 1000 MG CAPS, Take by mouth., Disp: , Rfl:    verapamil  (CALAN -SR) 240 MG CR tablet, TAKE 1 TABLET(240 MG) BY MOUTH DAILY, Disp: 90 tablet, Rfl: 1   amphetamine -dextroamphetamine  (ADDERALL) 20 MG tablet, Take 1 tablet (20 mg total) by mouth 2 (two) times daily., Disp: 60 tablet, Rfl: 0   amphetamine -dextroamphetamine  (ADDERALL) 20 MG tablet, Take 1 tablet (20 mg total) by mouth 2 (two) times daily., Disp: 60 tablet, Rfl: 0   amphetamine -dextroamphetamine  (ADDERALL) 20 MG tablet, Take 1 tablet (20 mg  total) by mouth 2 (two) times daily., Disp: 60 tablet, Rfl: 0   citalopram  (CELEXA ) 20 MG tablet, TAKE 1 TABLET(20 MG) BY MOUTH DAILY, Disp: 90 tablet, Rfl: 0   diclofenac  (VOLTAREN ) 50 MG EC tablet, Take 1 tablet (50 mg total) by mouth 2 (two) times daily as needed., Disp: 60 tablet, Rfl: 0   hydrochlorothiazide  (MICROZIDE ) 12.5 MG capsule, Take 1 capsule (12.5 mg total) by mouth daily., Disp: 90 capsule, Rfl: 0  Review of Systems:  Negative unless indicated in HPI.   Physical Exam: Vitals:   12/16/23 1323  BP: 130/80  Pulse: 70  Temp: 98.1 F (36.7 C)  TempSrc: Oral  SpO2: 96%  Weight: 201 lb 14.4 oz (91.6 kg)    Body mass index is 30.7 kg/m.   Physical Exam Vitals reviewed.  Constitutional:      Appearance: Normal appearance.  HENT:     Head: Normocephalic and atraumatic.  Eyes:      Conjunctiva/sclera: Conjunctivae normal.  Skin:    General: Skin is warm and dry.  Neurological:     General: No focal deficit present.     Mental Status: She is alert and oriented to person, place, and time.  Psychiatric:        Mood and Affect: Mood normal.        Behavior: Behavior normal.        Thought Content: Thought content normal.        Judgment: Judgment normal.      Impression and Plan:  Prediabetes -     POCT glycosylated hemoglobin (Hb A1C)  Attention deficit hyperactivity disorder (ADHD), predominantly inattentive type -     Amphetamine -Dextroamphetamine ; Take 1 tablet (20 mg total) by mouth 2 (two) times daily.  Dispense: 60 tablet; Refill: 0 -     Amphetamine -Dextroamphetamine ; Take 1 tablet (20 mg total) by mouth 2 (two) times daily.  Dispense: 60 tablet; Refill: 0 -     Amphetamine -Dextroamphetamine ; Take 1 tablet (20 mg total) by mouth 2 (two) times daily.  Dispense: 60 tablet; Refill: 0 -     Citalopram  Hydrobromide; TAKE 1 TABLET(20 MG) BY MOUTH DAILY  Dispense: 90 tablet; Refill: 0 -     Diclofenac  Sodium; Take 1 tablet (50 mg total) by mouth 2 (two) times daily as needed.  Dispense: 60 tablet; Refill: 0  Vitamin D  deficiency  Primary hypertension  Pure hypercholesterolemia  Essential hypertension -     hydroCHLOROthiazide ; Take 1 capsule (12.5 mg total) by mouth daily.  Dispense: 90 capsule; Refill: 0   -Medications refilled. -Blood pressure is fairly well-controlled. -A1c of 5.4 is stable to improved.  Time spent:30 minutes reviewing chart, interviewing and examining patient and formulating plan of care.     Tully Theophilus Andrews, MD [facility]

## 2023-12-22 DIAGNOSIS — S0990XA Unspecified injury of head, initial encounter: Secondary | ICD-10-CM | POA: Diagnosis not present

## 2023-12-22 DIAGNOSIS — S01401A Unspecified open wound of right cheek and temporomandibular area, initial encounter: Secondary | ICD-10-CM | POA: Diagnosis not present

## 2023-12-22 DIAGNOSIS — W01198A Fall on same level from slipping, tripping and stumbling with subsequent striking against other object, initial encounter: Secondary | ICD-10-CM | POA: Diagnosis not present

## 2023-12-25 NOTE — Progress Notes (Unsigned)
Subjective:   I, Philbert Riser, PhD, LAT, ATC acting as a scribe for Clementeen Graham, MD.  Chief Complaint: Misty Benson,  is a 63 y.o. female who presents for initial evaluation of a head injury. Pt tripped and fell while walking her dog. She was seen at the [facility] following the incident. Denies LOC.  Today, pt c/o cont'd ***  Injury date: 12/22/23 Visit #: 1  History of Present Illness:   Concussion Self-Reported Symptom Score Symptoms rated on a scale 1-6, in last 24 hours  Headache: ***   Pressure in head: *** Neck pain: *** Nausea or vomiting: *** Dizziness: ***  Blurred vision: ***  Balance problems: *** Sensitivity to light:  *** Sensitivity to noise: *** Feeling slowed down: *** Feeling like "in a fog": *** "Don't feel right": *** Difficulty concentrating: *** Difficulty remembering: *** Fatigue or low energy: *** Confusion: *** Drowsiness: *** More emotional: *** Irritability: *** Sadness: *** Nervous or anxious: *** Trouble falling asleep: ***   Total # of Symptoms: ***/22 Total Symptom Score: ***/132  Tinnitus: Yes/No***  Review of Systems:  ***    Review of History: ***  Objective:    Physical Examination There were no vitals filed for this visit. MSK:  *** Neuro: *** Psych: ***     Imaging:  ***  Assessment and Plan   63 y.o. female with ***    ***    Action/Discussion: Reviewed diagnosis, management options, expected outcomes, and the reasons for scheduled and emergent follow-up. Questions were adequately answered. Patient expressed verbal understanding and agreement with the following plan.     Patient Education: Reviewed with patient the risks (i.e, a repeat concussion, post-concussion syndrome, second-impact syndrome) of returning to play prior to complete resolution, and thoroughly reviewed the signs and symptoms of concussion.Reviewed need for complete resolution of all symptoms, with rest AND exertion, prior to return  to play. Reviewed red flags for urgent medical evaluation: worsening symptoms, nausea/vomiting, intractable headache, musculoskeletal changes, focal neurological deficits. [facility] Concussion Care Plan, which clearly outlines the plans stated above, was given to patient.   Level of service: ***     After Visit Summary printed out and provided to patient as appropriate.  The above documentation has been reviewed and is accurate and complete Adron Bene

## 2023-12-26 ENCOUNTER — Encounter: Payer: Self-pay | Admitting: Family Medicine

## 2023-12-26 ENCOUNTER — Ambulatory Visit (INDEPENDENT_AMBULATORY_CARE_PROVIDER_SITE_OTHER): Payer: BC Managed Care – PPO | Admitting: Family Medicine

## 2023-12-26 VITALS — BP 122/78 | HR 88 | Ht 68.0 in | Wt 200.0 lb

## 2023-12-26 DIAGNOSIS — S01111A Laceration without foreign body of right eyelid and periocular area, initial encounter: Secondary | ICD-10-CM

## 2023-12-26 DIAGNOSIS — S0511XA Contusion of eyeball and orbital tissues, right eye, initial encounter: Secondary | ICD-10-CM

## 2023-12-26 NOTE — Patient Instructions (Signed)
Thank you for coming in today.   That stitch should come out Monday or so.   I can see you for that if needed.   Recheck as needed.

## 2023-12-29 DIAGNOSIS — H43811 Vitreous degeneration, right eye: Secondary | ICD-10-CM | POA: Diagnosis not present

## 2024-01-01 ENCOUNTER — Encounter (INDEPENDENT_AMBULATORY_CARE_PROVIDER_SITE_OTHER): Payer: Self-pay | Admitting: Family Medicine

## 2024-01-01 ENCOUNTER — Ambulatory Visit (INDEPENDENT_AMBULATORY_CARE_PROVIDER_SITE_OTHER): Payer: BC Managed Care – PPO | Admitting: Family Medicine

## 2024-01-01 VITALS — BP 138/83 | HR 78 | Temp 98.1°F | Ht 68.0 in | Wt 194.0 lb

## 2024-01-01 DIAGNOSIS — E559 Vitamin D deficiency, unspecified: Secondary | ICD-10-CM | POA: Diagnosis not present

## 2024-01-01 DIAGNOSIS — Z6829 Body mass index (BMI) 29.0-29.9, adult: Secondary | ICD-10-CM | POA: Diagnosis not present

## 2024-01-01 DIAGNOSIS — R7303 Prediabetes: Secondary | ICD-10-CM | POA: Diagnosis not present

## 2024-01-01 DIAGNOSIS — E669 Obesity, unspecified: Secondary | ICD-10-CM | POA: Diagnosis not present

## 2024-01-01 NOTE — Progress Notes (Signed)
Misty Misty Benson, D.O.  ABFM, ABOM Specializing in Clinical Bariatric Medicine  Office located at: [address] [place], [place]  [address]   Assessment and Plan:   FOR THE DISEASE OF OBESITY: BMI 29.0-29.9,adult Current BMI 29.5 Obesity, Beginning BMI 37.86  Assessment & Plan: Since last office visit on 12/11/2023 patient's  Muscle mass has increased by 0.4 lbs. Fat mass has decreased by 1.6 lbs. Total body water has decreased by 0.6 lbs.  Counseling done on how various foods will affect these numbers and how to maximize success  Total lbs lost to date: 55 lbs Total weight loss percentage to date: 22.09%    Recommended Dietary Goals Misty Misty Benson currently in the action stage of change. As such, her goal Misty Benson to continue weight management plan.  She has agreed to: [employer] 1300-1400 cal and 85+ g protein with CAT 2 MP as a guide   Behavioral Intervention We discussed the following today: Tracking and journaling intake, making recipes in a healthier manner, avoid skipping meals, decreasing consumption of processed foods and increasing lean protein intake  Additional resources provided today:  Handout with Food Journaling log , Recipe of crustless egg white quiche  Evidence-based interventions for health behavior change were utilized today including the discussion of self monitoring techniques, problem-solving barriers and SMART goal setting techniques.   Regarding patient's less desirable eating habits and patterns, we employed the technique of small changes.   Pt will specifically work on: n/a   Recommended Physical Activity Goals Misty Misty Benson has been advised to work up to 150 minutes of moderate intensity aerobic activity a week and strengthening exercises 2-3 times per week for cardiovascular health, weight loss maintenance and preservation of muscle mass.   She has agreed to : Increase walking to 60 mins 3 days wk   Pharmacotherapy We both agreed to :  Continue  current medication regimen   FOR ASSOCIATED CONDITIONS ADDRESSED TODAY:  Vitamin D deficiency Assessment & Plan: Pt Misty Benson currently treating with OTC Vit D 10000 units daily Monday-Friday and 5000 units daily Saturday-Sunday. Continue OTC supplements. Will recheck levels in the near future.    Prediabetes Assessment & Plan: Misty Misty Benson Misty Benson currently occasionally managing with Metformin 500 mg twice daily. Pt reports sometimes forgetting to take medication. Given last labs per PCP on 12/16/2023, her A1c Misty Benson well controlled at 5.4. Encouraged pt to take medication regularly. Recommended to increase lean protein and continue to decrease simple carbs in diet.   Follow up:   Return in about 3 weeks (around 01/22/2024). She was informed of the importance of frequent follow up visits to maximize her success with intensive lifestyle modifications for her multiple health conditions.  Subjective:   Chief complaint: Obesity Misty Misty Benson Misty Benson Misty Benson to discuss her progress with her obesity treatment plan. She Misty Benson on the the Category 2 Plan and states she Misty Benson following her eating plan approximately 60-70% of the time. She states she Misty Benson walking 60 minutes 2 days per week.  Interval History:  Misty Misty Benson for a follow up office visit. Since last OV on 12/11/2023, Misty Misty Benson Misty Benson down 1 lb. She has been walking a lot on trails. Pt reports removing carbs and sugar from her diet. Admits to not eating enough protein in the last 2 weeks. Additionally, she does not eat enough during the day which she attributes to being busy. Pt also admits to not getting all her proteins in for dinner. Typically, Misty Misty Benson reports eating protein bars for  breakfast and sometimes for dinner.   Pharmacotherapy for weight loss: She Misty Benson occasionally taking Metformin 500 mg twice daily -- reports forgetting to take medication  Review of Systems:  Pertinent positives were addressed with patient today.  Reviewed by clinician on day of visit: allergies,  medications, problem list, medical history, surgical history, family history, social history, and previous encounter notes.  Weight Summary and Biometrics   Weight Lost Since Last Visit: 1  Weight Gained Since Last Visit: 0    Vitals Temp: 98.1 F (36.7 C) BP: 138/83 Pulse Rate: 78 SpO2: 99 %   Anthropometric Measurements Height: 5\' 8"  (1.727 m) Weight: 194 lb (88 kg) BMI (Calculated): 29.5 Weight at Last Visit: 195 lb Weight Lost Since Last Visit: 1 Weight Gained Since Last Visit: 0 Starting Weight: 249 lb Total Weight Loss (lbs): 55 lb (24.9 kg)   Body Composition  Body Fat %: 42.2 % Fat Mass (lbs): 82 lbs Muscle Mass (lbs): 106.6 lbs Total Body Water (lbs): 75.4 lbs Visceral Fat Rating : 11   Other Clinical Data Fasting: no Labs: no Today's Visit #: 35 Starting Date: 04/05/21    Objective:   PHYSICAL EXAM: Blood pressure 138/83, pulse 78, temperature 98.1 F (36.7 C), height 5\' 8"  (1.727 m), weight 194 lb (88 kg), last menstrual period 05/30/2012, SpO2 99%. Body mass index Misty Benson 29.5 kg/m.  General: she Misty Benson overweight, cooperative and in no acute distress. PSYCH: Has normal mood, affect and thought process.   HEENT: EOMI, sclerae are anicteric. Lungs: Normal breathing effort, no conversational dyspnea. Extremities: Moves * 4 Neurologic: A and O * 3, good insight  DIAGNOSTIC DATA REVIEWED: BMET    Component Value Date/Time   NA 141 08/21/2023 1401   NA 144 05/30/2022 1301   K 4.2 08/21/2023 1401   CL 104 08/21/2023 1401   CO2 31 08/21/2023 1401   GLUCOSE 92 08/21/2023 1401   BUN 26 (H) 08/21/2023 1401   BUN 30 (H) 05/30/2022 1301   CREATININE 0.75 08/21/2023 1401   CREATININE 0.78 02/23/2020 1230   CALCIUM 10.0 08/21/2023 1401   GFRNONAA >60 02/24/2016 1150   GFRAA >60 02/24/2016 1150   Lab Results  Component Value Date   HGBA1C 5.4 12/16/2023   HGBA1C 5.8 (H) 04/05/2021   Lab Results  Component Value Date   INSULIN 5.1 05/30/2022    INSULIN 9.5 04/05/2021   Lab Results  Component Value Date   TSH 1.64 08/21/2023   CBC    Component Value Date/Time   WBC 4.7 08/21/2023 1401   RBC 4.61 08/21/2023 1401   HGB 14.0 08/21/2023 1401   HGB 13.8 05/30/2022 1301   HCT 43.9 08/21/2023 1401   HCT 41.5 05/30/2022 1301   PLT 273.0 08/21/2023 1401   PLT 262 05/30/2022 1301   MCV 95.2 08/21/2023 1401   MCV 92 05/30/2022 1301   MCH 30.7 05/30/2022 1301   MCH 30.7 02/23/2020 1230   MCHC 31.9 08/21/2023 1401   RDW 13.8 08/21/2023 1401   RDW 11.9 05/30/2022 1301   Iron Studies No results found for: "IRON", "TIBC", "FERRITIN", "IRONPCTSAT" Lipid Panel     Component Value Date/Time   CHOL 222 (H) 08/21/2023 1401   CHOL 194 05/30/2022 1301   TRIG 56.0 08/21/2023 1401   HDL 100.40 08/21/2023 1401   HDL 85 05/30/2022 1301   CHOLHDL 2 08/21/2023 1401   VLDL 11.2 08/21/2023 1401   LDLCALC 110 (H) 08/21/2023 1401   LDLCALC 96 05/30/2022 1301   LDLCALC  140 (H) 02/23/2020 1230   Hepatic Function Panel     Component Value Date/Time   PROT 6.1 08/21/2023 1401   PROT 6.2 05/30/2022 1301   ALBUMIN 4.4 08/21/2023 1401   ALBUMIN 4.4 05/30/2022 1301   AST 17 08/21/2023 1401   ALT 31 08/21/2023 1401   ALKPHOS 52 08/21/2023 1401   BILITOT 0.5 08/21/2023 1401   BILITOT 0.3 05/30/2022 1301   BILIDIR 0.0 07/30/2016 0859      Component Value Date/Time   TSH 1.64 08/21/2023 1401   Nutritional Lab Results  Component Value Date   VD25OH 35.58 08/21/2023   VD25OH 70.7 05/30/2022   VD25OH 41.1 04/05/2021    Attestations:   I, Camryn Mix, acting as a Stage manager for [employer], DO., have compiled all relevant documentation for today's office visit on behalf of Thomasene Lot, DO, while in the presence of [employer], DO.  Reviewed by clinician on day of visit: allergies, medications, problem list, medical history, surgical history, family history, social history, and previous encounter notes pertinent to  patient's obesity diagnosis. I have spent 32 minutes in the care of the patient today including: preparing to see patient (e.g. review and interpretation of tests, old notes ), obtaining and/or reviewing separately obtained history, performing a medically appropriate examination or evaluation, counseling and educating the patient, ordering medications, test or procedures, documenting clinical information in the electronic or other health care record, and independently interpreting results and communicating results to the patient, family, or caregiver   I have reviewed the above documentation for accuracy and completeness, and I agree with the above. Misty Misty Benson, D.O.  The 21st Century Cures Act was signed into law in 2016 which includes the topic of electronic health records.  This provides immediate access to information in MyChart.  This includes consultation notes, operative notes, office notes, lab results and pathology reports.  If you have any questions about what you read please let us know at your next visit so we can discuss your concerns and take corrective action if need be.  We are right Misty Benson with you.

## 2024-01-16 ENCOUNTER — Other Ambulatory Visit: Payer: Self-pay | Admitting: Internal Medicine

## 2024-01-16 DIAGNOSIS — F900 Attention-deficit hyperactivity disorder, predominantly inattentive type: Secondary | ICD-10-CM

## 2024-01-20 DIAGNOSIS — M25522 Pain in left elbow: Secondary | ICD-10-CM | POA: Diagnosis not present

## 2024-01-20 DIAGNOSIS — M19022 Primary osteoarthritis, left elbow: Secondary | ICD-10-CM | POA: Diagnosis not present

## 2024-01-22 ENCOUNTER — Ambulatory Visit (INDEPENDENT_AMBULATORY_CARE_PROVIDER_SITE_OTHER): Payer: BC Managed Care – PPO | Admitting: Physician Assistant

## 2024-01-26 ENCOUNTER — Encounter (INDEPENDENT_AMBULATORY_CARE_PROVIDER_SITE_OTHER): Payer: Self-pay

## 2024-03-08 DIAGNOSIS — L570 Actinic keratosis: Secondary | ICD-10-CM | POA: Diagnosis not present

## 2024-03-08 DIAGNOSIS — L738 Other specified follicular disorders: Secondary | ICD-10-CM | POA: Diagnosis not present

## 2024-03-08 DIAGNOSIS — M898X7 Other specified disorders of bone, ankle and foot: Secondary | ICD-10-CM | POA: Diagnosis not present

## 2024-03-08 DIAGNOSIS — Z85820 Personal history of malignant melanoma of skin: Secondary | ICD-10-CM | POA: Diagnosis not present

## 2024-03-08 DIAGNOSIS — Z85828 Personal history of other malignant neoplasm of skin: Secondary | ICD-10-CM | POA: Diagnosis not present

## 2024-03-12 ENCOUNTER — Other Ambulatory Visit: Payer: Self-pay | Admitting: Internal Medicine

## 2024-03-12 DIAGNOSIS — I10 Essential (primary) hypertension: Secondary | ICD-10-CM

## 2024-04-01 ENCOUNTER — Encounter (INDEPENDENT_AMBULATORY_CARE_PROVIDER_SITE_OTHER): Payer: Self-pay | Admitting: Family Medicine

## 2024-04-06 NOTE — Telephone Encounter (Signed)
 LAST APPOINTMENT DATE:                                    Last OV was 01/01/24  NEXT APPOINTMENT DATE: 05/06/24   [id number] Jonette Nestle, [place] - [address] AT [facility] [address] [facility] [address] & [facility] [address] [place] [id number] Phone: [phone] Fax: [phone]  [place] [facility] [address] [place] [address] Phone: [phone] Fax: [phone]  [facility] [address], [place] - [address] [place] [address] Phone: [phone] Fax: [phone]  Patient is requesting a refill of the following medications: No prescriptions requested or ordered in this encounter   Date last filled: 12/11/23 Previously prescribed by Dr Alvia Awkward  Lab Results      Component                Value               Date                      HGBA1C        /           5.4                 12/16/2023                HGBA1C                   5.6                 08/21/2023                HGBA1C                   5.4                 02/11/2023           Lab Results      Component                Value               Date                      LDLCALC                  110 (H)             08/21/2023                CREATININE               0.75                08/21/2023           Lab Results      Component                Value               Date                      VD25OH                   35.58  08/21/2023                VD25OH                   70.7                05/30/2022                VD25OH                   41.1                04/05/2021            BP Readings from Last 3 Encounters: 01/01/24 : 138/83 12/26/23 : 122/78 12/16/23 : 130/80

## 2024-05-06 ENCOUNTER — Ambulatory Visit (INDEPENDENT_AMBULATORY_CARE_PROVIDER_SITE_OTHER): Admitting: Family Medicine

## 2024-05-06 ENCOUNTER — Encounter (INDEPENDENT_AMBULATORY_CARE_PROVIDER_SITE_OTHER): Payer: Self-pay | Admitting: Family Medicine

## 2024-05-06 VITALS — BP 128/73 | HR 64 | Ht 68.0 in | Wt 198.0 lb

## 2024-05-06 DIAGNOSIS — E669 Obesity, unspecified: Secondary | ICD-10-CM | POA: Diagnosis not present

## 2024-05-06 DIAGNOSIS — E559 Vitamin D deficiency, unspecified: Secondary | ICD-10-CM

## 2024-05-06 DIAGNOSIS — R7303 Prediabetes: Secondary | ICD-10-CM

## 2024-05-06 DIAGNOSIS — Z6830 Body mass index (BMI) 30.0-30.9, adult: Secondary | ICD-10-CM | POA: Diagnosis not present

## 2024-05-06 MED ORDER — METFORMIN HCL 500 MG PO TABS
500.0000 mg | ORAL_TABLET | Freq: Two times a day (BID) | ORAL | 0 refills | Status: DC
Start: 2024-05-06 — End: 2024-08-09

## 2024-05-06 NOTE — Progress Notes (Signed)
 Misty Benson, D.O.  ABFM, ABOM Specializing in Clinical Bariatric Medicine  Office located at: [address] [place], [place]  [address]   Assessment and Plan:   Medications Discontinued During This Encounter  Medication Reason   metFORMIN  (GLUCOPHAGE ) 500 MG tablet Reorder    Meds ordered this encounter  Medications   metFORMIN  (GLUCOPHAGE ) 500 MG tablet    Sig: Take 1 tablet (500 mg total) by mouth 2 (two) times daily with a meal.    Dispense:  120 tablet    Refill:  0    NEEDS OV FOR REFILLS      FOR THE DISEASE OF OBESITY: Obesity, Beginning BMI 37.86 BMI 30.0-30.9,adult current 30.11 Assessment & Plan: Since last office visit on 01/01/24 patient's muscle mass has decreased by 1.6 lbs. Fat mass has increased by 6 lbs. Total body water has increased by 7.2 lbs.  Counseling done on how various foods will affect these numbers and how to maximize success  Total lbs lost to date: 51 lbs Total weight loss percentage to date: -20.48%    Recommended Dietary Goals [name] is currently in the action stage of change. As such, her goal is to continue weight management plan.  She has agreed to: continue current plan  Pt believes journaling by hand will be more helpful than journaling on any apps on her phone. She states she will track her daily calorie and protein intake in a notebook.   Behavioral Intervention We discussed the following today: increasing lean protein intake to established goals, avoiding skipping meals, work on tracking and journaling calories using tracking application, keeping healthy foods at home, decreasing eating out or consumption of processed foods, and making healthy choices when eating convenient foods, work on managing stress, creating time for self-care and relaxation, avoiding temptations and identifying enticing environmental cues, and planning for success  Additional resources provided today: None  Evidence-based interventions for health  behavior change were utilized today including the discussion of self monitoring techniques, problem-solving barriers and SMART goal setting techniques.   Regarding patient's less desirable eating habits and patterns, we employed the technique of small changes.   Pt will specifically work on: focus on her own physical and mental health  for next visit.    Recommended Physical Activity Goals [name] has been advised to work up to 300-450 minutes of moderate intensity aerobic activity a week and strengthening exercises 2-3 times per week for cardiovascular health, weight loss maintenance and preservation of muscle mass.   She has agreed to: Increase physical activity in their day and reduce sedentary time (increase NEAT).   Pharmacotherapy We both agreed to: Continue with current nutritional and behavioral strategies   ASSOCIATED CONDITIONS ADDRESSED TODAY:  Pre-diabetes Assessment & Plan: Lab Results  Component Value Date   HGBA1C 5.4 12/16/2023   HGBA1C 5.6 08/21/2023   HGBA1C 5.4 02/11/2023   INSULIN  5.1 05/30/2022   INSULIN  9.5 04/05/2021    Has not been taking her Metformin , stating she noticed she was running low on her supply prior to her 2 week trip to [place]. Also reports skipping meals and difficulty prioritizing her physical mental health due to recent travel and care-giving responsibilities.  Encouraged pt to resume Metformin  with meals, as prescribed. Will be refilling Metformin  today. Emphasized the important of medication adherence to support glycemic control. Advised pt to work on meeting her daily protein goals, decreasing simple carbs/sugars, avoid skipping meals, and staying properly hydrated throughout the day. Reviewed stress management strategies  and encouraged pt to prioritize her health overall. Patient verbalized understanding and agreement to this plan. Will continue monitoring.    Vitamin D  deficiency Assessment & Plan: Lab Results  Component Value Date    VD25OH 35.58 08/21/2023   VD25OH 70.7 05/30/2022   VD25OH 41.1 04/05/2021   Pt is on OTC vitamin D  daily with good compliance and tolerance. No adverse SE reported. Reviewed goal vitamin D  levels of 50-70 with patient. Discussed how vitamin D  is expected to improve during summer months. Continue current supplementation. Will recheck levels periodically.     Follow up:   Return in 6 weeks (on 06/17/2024) for 3-6 week follow up . She was informed of the importance of frequent follow up visits to maximize her success with intensive lifestyle modifications for her multiple health conditions.  Subjective:   Chief complaint: Obesity [name] is here to discuss her progress with her obesity treatment plan. She is on the Category 2 Plan and keeping a food journal and adhering to recommended goals of 1300-1400 calories and 85+ g of protein and states she is following her eating plan approximately 30% of the time. She states she is not exercising.  Interval History:  Misty Benson is here for a follow up office visit. Since last OV on 01/01/24, she is up 4 lbs. Pt reports increased stress due to her son's recent illness, which has led to stress eating and frequently reaching for quick, convenient foods. Her son is currently staying with her, and the presence of his preferred, unhealthy snacks at home has made it harder for her to stay on track with her meal plan. Over the past 4 weeks, she has been traveling and focused on care-giving, which has caused her to not prioritize her own health. She admits to skipping meals more frequently. Today, she had chicken and cabbage for lunch.  Pharmacotherapy for weight loss: She is not taking any medications. Is rx'ed Metformin , but has been low on her supply and requests a refill today.   Review of Systems:  Pertinent positives were addressed with patient today.  Reviewed by clinician on day of visit: allergies, medications, problem list, medical history, surgical  history, family history, social history, and previous encounter notes.  Weight Summary and Biometrics   Weight Lost Since Last Visit: 0lb  Weight Gained Since Last Visit: 4lb   Vitals BP: 128/73 Pulse Rate: 64 SpO2: 98 %   Anthropometric Measurements Height: 5' 8 (1.727 m) Weight: 198 lb (89.8 kg) BMI (Calculated): 30.11 Weight at Last Visit: 194lb Weight Lost Since Last Visit: 0lb Weight Gained Since Last Visit: 4lb Starting Weight: 249lb Total Weight Loss (lbs): 31 lb (14.1 kg)   Body Composition  Body Fat %: 44.3 % Fat Mass (lbs): 88 lbs Muscle Mass (lbs): 105 lbs Total Body Water (lbs): 82.6 lbs   Other Clinical Data Fasting: No Labs: no Today's Visit #: 36 Starting Date: 04/05/21    Objective:   PHYSICAL EXAM: Blood pressure 128/73, pulse 64, height 5' 8 (1.727 m), weight 198 lb (89.8 kg), last menstrual period 05/30/2012, SpO2 98%. Body mass index is 30.11 kg/m.  General: she is overweight, cooperative and in no acute distress. PSYCH: Has normal mood, affect and thought process.   HEENT: EOMI, sclerae are anicteric. Lungs: Normal breathing effort, no conversational dyspnea. Extremities: Moves * 4 Neurologic: A and O * 3, good insight  DIAGNOSTIC DATA REVIEWED: BMET    Component Value Date/Time   NA 141 08/21/2023 1401  NA 144 05/30/2022 1301   K 4.2 08/21/2023 1401   CL 104 08/21/2023 1401   CO2 31 08/21/2023 1401   GLUCOSE 92 08/21/2023 1401   BUN 26 (H) 08/21/2023 1401   BUN 30 (H) 05/30/2022 1301   CREATININE 0.75 08/21/2023 1401   CREATININE 0.78 02/23/2020 1230   CALCIUM 10.0 08/21/2023 1401   GFRNONAA >60 02/24/2016 1150   GFRAA >60 02/24/2016 1150   Lab Results  Component Value Date   HGBA1C 5.4 12/16/2023   HGBA1C 5.8 (H) 04/05/2021   Lab Results  Component Value Date   INSULIN  5.1 05/30/2022   INSULIN  9.5 04/05/2021   Lab Results  Component Value Date   TSH 1.64 08/21/2023   CBC    Component Value Date/Time    WBC 4.7 08/21/2023 1401   RBC 4.61 08/21/2023 1401   HGB 14.0 08/21/2023 1401   HGB 13.8 05/30/2022 1301   HCT 43.9 08/21/2023 1401   HCT 41.5 05/30/2022 1301   PLT 273.0 08/21/2023 1401   PLT 262 05/30/2022 1301   MCV 95.2 08/21/2023 1401   MCV 92 05/30/2022 1301   MCH 30.7 05/30/2022 1301   MCH 30.7 02/23/2020 1230   MCHC 31.9 08/21/2023 1401   RDW 13.8 08/21/2023 1401   RDW 11.9 05/30/2022 1301   Iron Studies No results found for: IRON, TIBC, FERRITIN, IRONPCTSAT Lipid Panel     Component Value Date/Time   CHOL 222 (H) 08/21/2023 1401   CHOL 194 05/30/2022 1301   TRIG 56.0 08/21/2023 1401   HDL 100.40 08/21/2023 1401   HDL 85 05/30/2022 1301   CHOLHDL 2 08/21/2023 1401   VLDL 11.2 08/21/2023 1401   LDLCALC 110 (H) 08/21/2023 1401   LDLCALC 96 05/30/2022 1301   LDLCALC 140 (H) 02/23/2020 1230   Hepatic Function Panel     Component Value Date/Time   PROT 6.1 08/21/2023 1401   PROT 6.2 05/30/2022 1301   ALBUMIN 4.4 08/21/2023 1401   ALBUMIN 4.4 05/30/2022 1301   AST 17 08/21/2023 1401   ALT 31 08/21/2023 1401   ALKPHOS 52 08/21/2023 1401   BILITOT 0.5 08/21/2023 1401   BILITOT 0.3 05/30/2022 1301   BILIDIR 0.0 07/30/2016 0859      Component Value Date/Time   TSH 1.64 08/21/2023 1401   Nutritional Lab Results  Component Value Date   VD25OH 35.58 08/21/2023   VD25OH 70.7 05/30/2022   VD25OH 41.1 04/05/2021    Attestations:   I, Vernell Forest, acting as a Stage manager for Misty Jenkins, DO., have compiled all relevant documentation for today's office visit on behalf of Misty Jenkins, DO, while in the presence of [employer], DO.  Reviewed by clinician on day of visit: allergies, medications, problem list, medical history, surgical history, family history, social history, and previous encounter notes pertinent to patient's obesity diagnosis.  I have reviewed the above documentation for accuracy and completeness, and I agree with the above.  Misty JINNY Benson, D.O.  The 21st Century Cures Act was signed into law in 2016 which includes the topic of electronic health records.  This provides immediate access to information in MyChart.  This includes consultation notes, operative notes, office notes, lab results and pathology reports.  If you have any questions about what you read please let us  know at your next visit so we can discuss your concerns and take corrective action if need be.  We are right here with you.

## 2024-05-17 ENCOUNTER — Ambulatory Visit: Payer: Self-pay

## 2024-05-17 ENCOUNTER — Telehealth: Admitting: Physician Assistant

## 2024-05-17 DIAGNOSIS — M62830 Muscle spasm of back: Secondary | ICD-10-CM | POA: Diagnosis not present

## 2024-05-17 MED ORDER — CYCLOBENZAPRINE HCL 10 MG PO TABS: 5.0000 mg | ORAL_TABLET | Freq: Three times a day (TID) | ORAL | 0 refills | Status: DC | PRN

## 2024-05-17 MED ORDER — METHYLPREDNISOLONE 4 MG PO TBPK: ORAL_TABLET | ORAL | 0 refills | Status: DC

## 2024-05-17 NOTE — Progress Notes (Signed)
 Virtual Visit Consent   Misty Benson, you are scheduled for a virtual visit with a [facility] provider today. Just as with appointments in the office, your consent must be obtained to participate. Your consent will be active for this visit and any virtual visit you may have with one of our providers in the next 365 days. If you have a MyChart account, a copy of this consent can be sent to you electronically.  As this is a virtual visit, video technology does not allow for your provider to perform a traditional examination. This may limit your provider's ability to fully assess your condition. If your provider identifies any concerns that need to be evaluated in person or the need to arrange testing (such as labs, EKG, etc.), we will make arrangements to do so. Although advances in technology are sophisticated, we cannot ensure that it will always work on either your end or our end. If the connection with a video visit is poor, the visit may have to be switched to a telephone visit. With either a video or telephone visit, we are not always able to ensure that we have a secure connection.  By engaging in this virtual visit, you consent to the provision of healthcare and authorize for your insurance to be billed (if applicable) for the services provided during this visit. Depending on your insurance coverage, you may receive a charge related to this service.  I need to obtain your verbal consent now. Are you willing to proceed with your visit today? Misty Benson has provided verbal consent on 05/17/2024 for a virtual visit (video or telephone). Misty CHRISTELLA Dickinson, PA-C  Date: 05/17/2024 6:58 PM   Virtual Visit via Video Note   I, Misty Benson, [name], 02-Oct-1961) on 05/17/24 at  6:45 PM EDT by a video-enabled telemedicine application and verified that I am speaking with the correct person using two identifiers.  Location: Patient: Virtual Visit Location  Patient: Home Provider: Virtual Visit Location Provider: Home Office   I discussed the limitations of evaluation and management by telemedicine and the availability of in person appointments. The patient expressed understanding and agreed to proceed.    History of Present Illness: Misty Benson is a 63 y.o. who identifies as a female who was assigned female at birth, and is being seen today for recurrent back spasm.  HPI: Back Pain This is a recurrent problem. Episode onset: last flare 5 years ago, has been kept at bay with home exercises and stretches and seeing a Chiropractor regularly until he retired this year; current flare started last week. The problem occurs constantly. The problem is unchanged. The pain is present in the lumbar spine. The quality of the pain is described as cramping. The pain does not radiate. The pain is The same all the time. Stiffness is present All day. Pertinent negatives include no bladder incontinence, bowel incontinence, fever, leg pain, numbness, paresthesias, perianal numbness, tingling or weakness. She has tried heat, home exercises, bed rest, NSAIDs and ice (diclofenac , motrin , and aleve) for the symptoms. The treatment provided no relief.     Problems:  Patient Active Problem List   Diagnosis Date Noted   Stress 12/11/2023   Other insomnia 12/11/2023   Allergic rhinitis due to allergen 01/22/2023   BMI 29.0-29.9,adult 12/24/2022   Obesity, Beginning BMI 37.86 12/24/2022   Status post hip replacement 09/11/2022   Elevated blood-pressure reading without diagnosis of hypertension 07/03/2022  Class 2 severe obesity with serious comorbidity and body mass index (BMI) of 37.0 to 37.9 in adult [facility]) 07/03/2022   Vitamin D  deficiency 11/02/2021   Prediabetes 04/10/2021   Hyperlipidemia 07/13/2020   Hypertension    Plantar fasciitis 01/22/2011   SYMPTOMATIC MENOPAUSAL/FEMALE CLIMACTERIC STATES 05/16/2010   SNORING 05/04/2010   HEADACHE, CLUSTER 01/17/2010    SINUSITIS, CHRONIC 11/29/2009   Backache 11/16/2008   MALIGNANT MELANOMA OTHER SPECIFIED SITES SKIN 08/12/2008   OBESITY NOS 09/10/2007   Depression, recurrent (HCC) 09/10/2007   Attention deficit disorder 09/10/2007    Allergies: No Known Allergies Medications:  Current Outpatient Medications:    cyclobenzaprine  (FLEXERIL ) 10 MG tablet, Take 0.5-1 tablets (5-10 mg total) by mouth 3 (three) times daily as needed., Disp: 30 tablet, Rfl: 0   methylPREDNISolone  (MEDROL  DOSEPAK) 4 MG TBPK tablet, 6 day taper; take as directed on package instructions, Disp: 21 tablet, Rfl: 0   amphetamine -dextroamphetamine  (ADDERALL) 20 MG tablet, Take 1 tablet (20 mg total) by mouth 2 (two) times daily., Disp: 60 tablet, Rfl: 0   amphetamine -dextroamphetamine  (ADDERALL) 20 MG tablet, Take 1 tablet (20 mg total) by mouth 2 (two) times daily., Disp: 60 tablet, Rfl: 0   amphetamine -dextroamphetamine  (ADDERALL) 20 MG tablet, Take 1 tablet (20 mg total) by mouth 2 (two) times daily., Disp: 60 tablet, Rfl: 0   Cholecalciferol (VITAMIN D3) 125 MCG (5000 UT) CAPS, Take 2 caps daily Monday through Friday and 1 cap daily Saturday and Sunday., Disp: , Rfl:    citalopram  (CELEXA ) 20 MG tablet, TAKE 1 TABLET(20 MG) BY MOUTH DAILY, Disp: 90 tablet, Rfl: 1   diclofenac  (VOLTAREN ) 50 MG EC tablet, Take 1 tablet (50 mg total) by mouth 2 (two) times daily as needed., Disp: 60 tablet, Rfl: 0   hydrochlorothiazide  (MICROZIDE ) 12.5 MG capsule, Take 1 capsule (12.5 mg total) by mouth daily., Disp: 90 capsule, Rfl: 0   ibuprofen  (ADVIL ,MOTRIN ) 200 MG tablet, Take 3-4 tablets (600-800 mg total) by mouth every 6 (six) hours as needed for fever, headache, mild pain, moderate pain or cramping., Disp: 30 tablet, Rfl: 6   metFORMIN  (GLUCOPHAGE ) 500 MG tablet, Take 1 tablet (500 mg total) by mouth 2 (two) times daily with a meal., Disp: 120 tablet, Rfl: 0   Multiple Vitamin (MULTIVITAMIN PO), Take by mouth., Disp: , Rfl:    Omega-3 1000 MG  CAPS, Take by mouth., Disp: , Rfl:    verapamil  (CALAN -SR) 240 MG CR tablet, TAKE 1 TABLET(240 MG) BY MOUTH DAILY, Disp: 90 tablet, Rfl: 1  Observations/Objective: Patient is well-developed, well-nourished in no acute distress.  Resting comfortably at home.  Head is normocephalic, atraumatic.  No labored breathing.  Speech is clear and coherent with logical content.  Patient is alert and oriented at baseline.    Assessment and Plan: 1. Lumbar paraspinal muscle spasm (Primary) - cyclobenzaprine  (FLEXERIL ) 10 MG tablet; Take 0.5-1 tablets (5-10 mg total) by mouth 3 (three) times daily as needed.  Dispense: 30 tablet; Refill: 0 - methylPREDNISolone  (MEDROL  DOSEPAK) 4 MG TBPK tablet; 6 day taper; take as directed on package instructions  Dispense: 21 tablet; Refill: 0  - Suspect lumbar spasm - Failed NSAIDs - Add Medrol  dose pack and flexeril  - Avoid NSAIDs (Ibuprofen /Advil /Motrin  or Naproxen/Aleve) while on steroid - Tylenol  if okay for breakthrough pain - Heat to area - Epsom salt soak if able to get in and out of bath tub safely - Back exercises and stretches provided via AVS - Seek in person evaluation if worsening  or fails to improve with treatment   Follow Up Instructions: I discussed the assessment and treatment plan with the patient. The patient was provided an opportunity to ask questions and all were answered. The patient agreed with the plan and demonstrated an understanding of the instructions.  A copy of instructions were sent to the patient via MyChart unless otherwise noted below.    The patient was advised to call back or seek an in-person evaluation if the symptoms worsen or if the condition fails to improve as anticipated.    Misty CHRISTELLA Dickinson, PA-C

## 2024-05-17 NOTE — Telephone Encounter (Signed)
 Lifelong thing, not acute but has been ongoing for about a week. Chiropractor retired, left message for an afternoon appt today.  She has already used ice, stretching, and this typically can get it to loosened up by end of day. Last visit for this was about 5y ago, did telemed, and was given muscle relaxer.  She ultimately wants a prescription, something to knock this out with. Explained that she'll need to be seen before scripts can be written. She voiced understanding. She will have her son help her with a virtual urgent care visit.  Protocol Used: Back Pain (Adult) Protocol-Based Disposition: See in Office or Video Visit Today  Positive Triage Question: * Severe back pain (e.g., excruciating, unable to do any normal activities) and not improved after pain medicine and Care Advice * All higher-acuity triage questions were negative  Care Advice Discussed: * Reasons To Call Back     - Numbness or weakness occurs     - Loss of control of your bladder or bowel     - Pain begins to shoot into the leg     - Pain becomes worse     - You become worse

## 2024-05-17 NOTE — Telephone Encounter (Signed)
 Copied from CRM [id number]. Topic: Clinical - Red Word Triage >> May 17, 2024 11:47 AM [place] MATSU wrote: Red Word that prompted transfer to Nurse Triage: pain back spasm episode

## 2024-05-17 NOTE — Telephone Encounter (Signed)
 FYI Only or Action Required?: Action required by provider: request for appointment.  Patient was last seen in primary care on 05/06/2024 by Midge Sober, DO. Called Nurse Triage reporting Back spasm. Symptoms began a week ago. Interventions attempted: OTC medications: Aleve/ibuprofen , Rest, hydration, or home remedies, and Ice/heat application. Symptoms are: gradually worsening.  Triage Disposition: See in office or video visit today  Patient/caregiver understands and will follow disposition?: Yes

## 2024-05-17 NOTE — Patient Instructions (Signed)
 Misty Benson, thank you for joining Misty CHRISTELLA Dickinson, Misty Benson for today's virtual visit.  While this provider is not your primary care provider (PCP), if your PCP is located in our provider database this encounter information will be shared with them immediately following your visit.   A [facility] MyChart account gives you access to today's visit and all your visits, tests, and labs performed at [facility]  click here if you don't have a [facility] MyChart account or go to [email or web address]  Consent: (Patient) Misty Benson provided verbal consent for this virtual visit at the beginning of the encounter.  Current Medications:  Current Outpatient Medications:    cyclobenzaprine  (FLEXERIL ) 10 MG tablet, Take 0.5-1 tablets (5-10 mg total) by mouth 3 (three) times daily as needed., Disp: 30 tablet, Rfl: 0   methylPREDNISolone  (MEDROL  DOSEPAK) 4 MG TBPK tablet, 6 day taper; take as directed on package instructions, Disp: 21 tablet, Rfl: 0   amphetamine -dextroamphetamine  (ADDERALL) 20 MG tablet, Take 1 tablet (20 mg total) by mouth 2 (two) times daily., Disp: 60 tablet, Rfl: 0   amphetamine -dextroamphetamine  (ADDERALL) 20 MG tablet, Take 1 tablet (20 mg total) by mouth 2 (two) times daily., Disp: 60 tablet, Rfl: 0   amphetamine -dextroamphetamine  (ADDERALL) 20 MG tablet, Take 1 tablet (20 mg total) by mouth 2 (two) times daily., Disp: 60 tablet, Rfl: 0   Cholecalciferol (VITAMIN D3) 125 MCG (5000 UT) CAPS, Take 2 caps daily Monday through Friday and 1 cap daily Saturday and Sunday., Disp: , Rfl:    citalopram  (CELEXA ) 20 MG tablet, TAKE 1 TABLET(20 MG) BY MOUTH DAILY, Disp: 90 tablet, Rfl: 1   diclofenac  (VOLTAREN ) 50 MG EC tablet, Take 1 tablet (50 mg total) by mouth 2 (two) times daily as needed., Disp: 60 tablet, Rfl: 0   hydrochlorothiazide  (MICROZIDE ) 12.5 MG capsule, Take 1 capsule (12.5 mg total) by mouth daily., Disp: 90 capsule, Rfl: 0   ibuprofen  (ADVIL ,MOTRIN ) 200  MG tablet, Take 3-4 tablets (600-800 mg total) by mouth every 6 (six) hours as needed for fever, headache, mild pain, moderate pain or cramping., Disp: 30 tablet, Rfl: 6   metFORMIN  (GLUCOPHAGE ) 500 MG tablet, Take 1 tablet (500 mg total) by mouth 2 (two) times daily with a meal., Disp: 120 tablet, Rfl: 0   Multiple Vitamin (MULTIVITAMIN PO), Take by mouth., Disp: , Rfl:    Omega-3 1000 MG CAPS, Take by mouth., Disp: , Rfl:    verapamil  (CALAN -SR) 240 MG CR tablet, TAKE 1 TABLET(240 MG) BY MOUTH DAILY, Disp: 90 tablet, Rfl: 1   Medications ordered in this encounter:  Meds ordered this encounter  Medications   cyclobenzaprine  (FLEXERIL ) 10 MG tablet    Sig: Take 0.5-1 tablets (5-10 mg total) by mouth 3 (three) times daily as needed.    Dispense:  30 tablet    Refill:  0    Supervising Provider:   LAMPTEY, PHILIP O [[id number]]   methylPREDNISolone  (MEDROL  DOSEPAK) 4 MG TBPK tablet    Sig: 6 day taper; take as directed on package instructions    Dispense:  21 tablet    Refill:  0    Supervising Provider:   BLAISE ALEENE KIDD [[id number]]     *If you need refills on other medications prior to your next appointment, please contact your pharmacy*  Follow-Up: Call back or seek an in-person evaluation if the symptoms worsen or if the condition fails to improve as anticipated.  [facility] [phone]  Other Instructions  Back Exercises The following exercises strengthen the muscles that help to support the trunk (torso) and back. They also help to keep the lower back flexible. Doing these exercises can help to prevent or lessen existing low back pain. If you have back pain or discomfort, try doing these exercises 2-3 times each day or as told by your health care provider. As your pain improves, do them once each day, but increase the number of times that you repeat the steps for each exercise (do more repetitions). To prevent the recurrence of back pain, continue to do these  exercises once each day or as told by your health care provider. Do exercises exactly as told by your health care provider and adjust them as directed. It is normal to feel mild stretching, pulling, tightness, or discomfort as you do these exercises, but you should stop right away if you feel sudden pain or your pain gets worse. Exercises Single knee to chest Repeat these steps 3-5 times for each leg: Lie on your back on a firm bed or the floor with your legs extended. Bring one knee to your chest. Your other leg should stay extended and in contact with the floor. Hold your knee in place by grabbing your knee or thigh with both hands and hold. Pull on your knee until you feel a gentle stretch in your lower back or buttocks. Hold the stretch for 10-30 seconds. Slowly release and straighten your leg.  Pelvic tilt Repeat these steps 5-10 times: Lie on your back on a firm bed or the floor with your legs extended. Bend your knees so they are pointing toward the ceiling and your feet are flat on the floor. Tighten your lower abdominal muscles to press your lower back against the floor. This motion will tilt your pelvis so your tailbone points up toward the ceiling instead of pointing to your feet or the floor. With gentle tension and even breathing, hold this position for 5-10 seconds.  Cat-cow Repeat these steps until your lower back becomes more flexible: Get into a hands-and-knees position on a firm bed or the floor. Keep your hands under your shoulders, and keep your knees under your hips. You may place padding under your knees for comfort. Let your head hang down toward your chest. Contract your abdominal muscles and point your tailbone toward the floor so your lower back becomes rounded like the back of a cat. Hold this position for 5 seconds. Slowly lift your head, let your abdominal muscles relax, and point your tailbone up toward the ceiling so your back forms a sagging arch like the back  of a cow. Hold this position for 5 seconds.  Press-ups Repeat these steps 5-10 times: Lie on your abdomen (face-down) on a firm bed or the floor. Place your palms near your head, about shoulder-width apart. Keeping your back as relaxed as possible and keeping your hips on the floor, slowly straighten your arms to raise the top half of your body and lift your shoulders. Do not use your back muscles to raise your upper torso. You may adjust the placement of your hands to make yourself more comfortable. Hold this position for 5 seconds while you keep your back relaxed. Slowly return to lying flat on the floor.  Bridges Repeat these steps 10 times: Lie on your back on a firm bed or the floor. Bend your knees so they are pointing toward the ceiling and your feet are flat on the floor. Your arms should be  flat at your sides, next to your body. Tighten your buttocks muscles and lift your buttocks off the floor until your waist is at almost the same height as your knees. You should feel the muscles working in your buttocks and the back of your thighs. If you do not feel these muscles, slide your feet 1-2 inches (2.5-5 cm) farther away from your buttocks. Hold this position for 3-5 seconds. Slowly lower your hips to the starting position, and allow your buttocks muscles to relax completely. If this exercise is too easy, try doing it with your arms crossed over your chest. Abdominal crunches Repeat these steps 5-10 times: Lie on your back on a firm bed or the floor with your legs extended. Bend your knees so they are pointing toward the ceiling and your feet are flat on the floor. Cross your arms over your chest. Tip your chin slightly toward your chest without bending your neck. Tighten your abdominal muscles and slowly raise your torso high enough to lift your shoulder blades a tiny bit off the floor. Avoid raising your torso higher than that because it can put too much stress on your lower back and  does not help to strengthen your abdominal muscles. Slowly return to your starting position.  Back lifts Repeat these steps 5-10 times: Lie on your abdomen (face-down) with your arms at your sides, and rest your forehead on the floor. Tighten the muscles in your legs and your buttocks. Slowly lift your chest off the floor while you keep your hips pressed to the floor. Keep the back of your head in line with the curve in your back. Your eyes should be looking at the floor. Hold this position for 3-5 seconds. Slowly return to your starting position.  Contact a health care provider if: Your back pain or discomfort gets much worse when you do an exercise. Your worsening back pain or discomfort does not lessen within 2 hours after you exercise. If you have any of these problems, stop doing these exercises right away. Do not do them again unless your health care provider says that you can. Get help right away if: You develop sudden, severe back pain. If this happens, stop doing the exercises right away. Do not do them again unless your health care provider says that you can. This information is not intended to replace advice given to you by your health care provider. Make sure you discuss any questions you have with your health care provider. Document Revised: 12/01/2022 Document Reviewed: 01/10/2021 Elsevier Patient Education  2024 Elsevier Inc.   If you have been instructed to have an in-person evaluation today at a local Urgent Care facility, please use the link below. It will take you to a list of all of our available [facility], including address, phone number and hours of operation. Please do not delay care.  [facility]  If you or a family member do not have a primary care provider, use the link below to schedule a visit and establish care. When you choose a [facility] primary care physician or advanced practice provider, you gain a long-term partner in  health. Find a Primary Care Provider  Learn more about [facility]'s in-office and virtual care options: [facility] - Get Care Now

## 2024-05-17 NOTE — Telephone Encounter (Signed)
 Spoke with the patient and scheduled an appt on 7/8 with Dr [place].

## 2024-05-18 ENCOUNTER — Ambulatory Visit: Admitting: Family Medicine

## 2024-05-18 DIAGNOSIS — M9901 Segmental and somatic dysfunction of cervical region: Secondary | ICD-10-CM | POA: Diagnosis not present

## 2024-05-18 DIAGNOSIS — M9903 Segmental and somatic dysfunction of lumbar region: Secondary | ICD-10-CM | POA: Diagnosis not present

## 2024-05-18 DIAGNOSIS — M9905 Segmental and somatic dysfunction of pelvic region: Secondary | ICD-10-CM | POA: Diagnosis not present

## 2024-05-18 DIAGNOSIS — M51362 Other intervertebral disc degeneration, lumbar region with discogenic back pain and lower extremity pain: Secondary | ICD-10-CM | POA: Diagnosis not present

## 2024-06-29 ENCOUNTER — Other Ambulatory Visit: Payer: Self-pay | Admitting: Internal Medicine

## 2024-06-29 DIAGNOSIS — F900 Attention-deficit hyperactivity disorder, predominantly inattentive type: Secondary | ICD-10-CM

## 2024-06-29 NOTE — Telephone Encounter (Unsigned)
 Copied from CRM #[id number]. Topic: Clinical - Medication Refill >> Jun 29, 2024  4:55 PM Drema MATSU wrote: Medication: amphetamine -dextroamphetamine  (ADDERALL) 20 MG tablet  Has the patient contacted their pharmacy? Yes (Agent: If no, request that the patient contact the pharmacy for the refill. If patient does not wish to contact the pharmacy document the reason why and proceed with request.) advised pcp to call  (Agent: If yes, when and what did the pharmacy advise?)  This is the patient's preferred pharmacy:  [id number] [place], [place] - [address] AT [facility] [address] [facility] [facility] [address] [facility] [address] [place] [address] Phone: [phone] Fax: [phone]   Is this the correct pharmacy for this prescription? Yes If no, delete pharmacy and type the correct one.   Has the prescription been filled recently? Yes  Is the patient out of the medication? No  Has the patient been seen for an appointment in the last year OR does the patient have an upcoming appointment? Yes  Can we respond through MyChart? Yes  Agent: Please be advised that Rx refills may take up to 3 business days. We ask that you follow-up with your pharmacy.

## 2024-06-30 ENCOUNTER — Other Ambulatory Visit: Payer: Self-pay | Admitting: Internal Medicine

## 2024-06-30 DIAGNOSIS — Z1231 Encounter for screening mammogram for malignant neoplasm of breast: Secondary | ICD-10-CM

## 2024-07-06 ENCOUNTER — Ambulatory Visit (INDEPENDENT_AMBULATORY_CARE_PROVIDER_SITE_OTHER): Payer: BC Managed Care – PPO | Admitting: Otolaryngology

## 2024-07-06 ENCOUNTER — Encounter (INDEPENDENT_AMBULATORY_CARE_PROVIDER_SITE_OTHER): Payer: Self-pay | Admitting: Otolaryngology

## 2024-07-06 VITALS — BP 139/84 | HR 65

## 2024-07-06 DIAGNOSIS — H6123 Impacted cerumen, bilateral: Secondary | ICD-10-CM

## 2024-07-06 DIAGNOSIS — H9313 Tinnitus, bilateral: Secondary | ICD-10-CM | POA: Diagnosis not present

## 2024-07-06 DIAGNOSIS — H903 Sensorineural hearing loss, bilateral: Secondary | ICD-10-CM

## 2024-07-07 DIAGNOSIS — Z780 Asymptomatic menopausal state: Secondary | ICD-10-CM | POA: Diagnosis not present

## 2024-07-07 DIAGNOSIS — H903 Sensorineural hearing loss, bilateral: Secondary | ICD-10-CM | POA: Insufficient documentation

## 2024-07-07 DIAGNOSIS — N958 Other specified menopausal and perimenopausal disorders: Secondary | ICD-10-CM | POA: Diagnosis not present

## 2024-07-07 DIAGNOSIS — N816 Rectocele: Secondary | ICD-10-CM | POA: Diagnosis not present

## 2024-07-07 DIAGNOSIS — Z01419 Encounter for gynecological examination (general) (routine) without abnormal findings: Secondary | ICD-10-CM | POA: Diagnosis not present

## 2024-07-07 DIAGNOSIS — M898X7 Other specified disorders of bone, ankle and foot: Secondary | ICD-10-CM | POA: Diagnosis not present

## 2024-07-07 DIAGNOSIS — Z124 Encounter for screening for malignant neoplasm of cervix: Secondary | ICD-10-CM | POA: Diagnosis not present

## 2024-07-07 DIAGNOSIS — H6123 Impacted cerumen, bilateral: Secondary | ICD-10-CM | POA: Insufficient documentation

## 2024-07-07 DIAGNOSIS — H9313 Tinnitus, bilateral: Secondary | ICD-10-CM | POA: Insufficient documentation

## 2024-07-07 NOTE — Progress Notes (Signed)
 Patient ID: Misty Benson, female   DOB: 04-26-1961, 63 y.o.   MRN: [id number]  Follow-up: Bilateral tinnitus, bilateral hearing loss  HPI: The patient is a 63 year old female who returns today for her follow-up evaluation.  The patient was last seen 1 year ago.  At that time, she was complaining of bilateral tinnitus.  She was noted to have bilateral high-frequency sensorineural hearing loss.  The strategies to cope with her tinnitus were discussed.  She tried the use of hearing aids, but without significant improvement in her tinnitus.  According to the patient, she continues to have bilateral high-pitched tinnitus.  She denies any recent otitis media or otitis externa.  She denies any significant otalgia, otorrhea, or vertigo.  Exam: General: Communicates without difficulty, well nourished, no acute distress. Head: Normocephalic, no evidence injury, no tenderness, facial buttresses intact without stepoff. Face/sinus: No tenderness to palpation and percussion. Facial movement is normal and symmetric. Eyes: PERRL, EOMI. No scleral icterus, conjunctivae clear. Neuro: CN II exam reveals vision grossly intact.  No nystagmus at any point of gaze. Ears: Auricles well formed without lesions.  Bilateral cerumen impaction.  Nose: External evaluation reveals normal support and skin without lesions.  Dorsum is intact.  Anterior rhinoscopy reveals normal mucosa over anterior aspect of inferior turbinates and intact septum.  No purulence noted. Oral:  Oral cavity and oropharynx are intact, symmetric, without erythema or edema.  Mucosa is moist without lesions. Neck: Full range of motion without pain.  There is no significant lymphadenopathy.  No masses palpable.  Thyroid  bed within normal limits to palpation.  Parotid glands and submandibular glands equal bilaterally without mass.  Trachea is midline. Neuro:  CN 2-12 grossly intact.   Procedure: Bilateral cerumen disimpaction Anesthesia: None Description: Under the  operating microscope, the cerumen is carefully removed with a combination of cerumen currette, alligator forceps, and suction catheters.  After the cerumen is removed, the TMs are noted to be normal.  No mass, erythema, or lesions. The patient tolerated the procedure well.    Assessment  1.  Bilateral cerumen impaction.  After the disimpaction procedure, both tympanic membranes and middle ear spaces are noted to be normal.  2.  The patient's ear canals, tympanic membranes, and middle ear spaces are normal.  3.  Subjectively stable bilateral high-frequency sensorineural hearing loss, likely secondary to routine presbycusis.  4.  The patient's tinnitus is likely a direct result of her hearing loss.  Plan: 1.  Otomicroscopy with bilateral cerumen disimpaction. 2.  The physical exam findings  are reviewed with the patient.  3.  The strategies to cope with tinnitus, including the use of masker, hearing aids, tinnitus retraining therapy, and avoidance of caffeine and alcohol are again discussed.  4.  The patient will return for reevaluation in 1 year.

## 2024-07-08 ENCOUNTER — Encounter: Payer: Self-pay | Admitting: Internal Medicine

## 2024-07-08 ENCOUNTER — Ambulatory Visit (INDEPENDENT_AMBULATORY_CARE_PROVIDER_SITE_OTHER): Admitting: Internal Medicine

## 2024-07-08 DIAGNOSIS — F900 Attention-deficit hyperactivity disorder, predominantly inattentive type: Secondary | ICD-10-CM

## 2024-07-08 MED ORDER — AMPHETAMINE-DEXTROAMPHETAMINE 20 MG PO TABS: 20.0000 mg | ORAL_TABLET | Freq: Two times a day (BID) | ORAL | 0 refills | Status: DC

## 2024-07-08 MED ORDER — DICLOFENAC SODIUM 50 MG PO TBEC: 50.0000 mg | DELAYED_RELEASE_TABLET | Freq: Two times a day (BID) | ORAL | 0 refills | Status: AC | PRN

## 2024-07-08 NOTE — Progress Notes (Signed)
 Established Patient Office Visit     CC/Reason for Visit: Medication refills  HPI: Misty Benson is a 63 y.o. female who is coming in today for the above mentioned reasons. Past Medical History is significant for: ADHD due for refill today for takes Adderall 20 mg twice daily.  Feels well without major concerns or complaints.   Past Medical/Surgical History: Past Medical History:  Diagnosis Date   ADHD (attention deficit hyperactivity disorder)    Anxiety    Back pain    Cancer (HCC)    BASAL CELL FACE AND SHOULDERS , SQUAMOS CELL ON HAND AND MELANOMA LEFT ARM...   Cervix cancer (HCC) 1987   microinvasive   Constipation    Edema of both lower extremities    Hypertension    Joint pain    Leiomyoma 2013   multiple small   Melanoma (HCC)    Obesity    Prediabetes    Shoulder pain    Snoring     Past Surgical History:  Procedure Laterality Date   arm surgery     to remove melanoma on left upper outter arm   BUNIONECTOMY     LEFT FOOT WITH PINS   CERVICAL CONE BIOPSY  11/11/1985   CESAREAN SECTION     DILATION AND CURETTAGE OF UTERUS     x2   FOOT SURGERY     MELANOMA EXCISION     TONSILLECTOMY     as a child   TOTAL HIP ARTHROPLASTY Left 06/06/2022    Social History:  reports that she has never smoked. She has never used smokeless tobacco. She reports current alcohol use of about 4.0 standard drinks of alcohol per week. She reports that she does not use drugs.  Allergies: No Known Allergies  Family History:  Family History  Problem Relation Age of Onset   Stroke Mother    Osteoporosis Mother    Hypertension Mother    Hyperlipidemia Father    Atrial fibrillation Father    Cancer Father        Prostate   Heart defect Father    Breast cancer Maternal Grandmother 45     Current Outpatient Medications:    Cholecalciferol (VITAMIN D3) 125 MCG (5000 UT) CAPS, Take 2 caps daily Monday through Friday and 1 cap daily Saturday and Sunday., Disp: ,  Rfl:    citalopram  (CELEXA ) 20 MG tablet, TAKE 1 TABLET(20 MG) BY MOUTH DAILY, Disp: 90 tablet, Rfl: 1   cyclobenzaprine  (FLEXERIL ) 10 MG tablet, Take 0.5-1 tablets (5-10 mg total) by mouth 3 (three) times daily as needed., Disp: 30 tablet, Rfl: 0   hydrochlorothiazide  (MICROZIDE ) 12.5 MG capsule, Take 1 capsule (12.5 mg total) by mouth daily., Disp: 90 capsule, Rfl: 0   ibuprofen  (ADVIL ,MOTRIN ) 200 MG tablet, Take 3-4 tablets (600-800 mg total) by mouth every 6 (six) hours as needed for fever, headache, mild pain, moderate pain or cramping., Disp: 30 tablet, Rfl: 6   metFORMIN  (GLUCOPHAGE ) 500 MG tablet, Take 1 tablet (500 mg total) by mouth 2 (two) times daily with a meal., Disp: 120 tablet, Rfl: 0   Multiple Vitamin (MULTIVITAMIN PO), Take by mouth., Disp: , Rfl:    Omega-3 1000 MG CAPS, Take by mouth., Disp: , Rfl:    verapamil  (CALAN -SR) 240 MG CR tablet, TAKE 1 TABLET(240 MG) BY MOUTH DAILY, Disp: 90 tablet, Rfl: 1   amphetamine -dextroamphetamine  (ADDERALL) 20 MG tablet, Take 1 tablet (20 mg total) by mouth 2 (two) times daily.,  Disp: 60 tablet, Rfl: 0   amphetamine -dextroamphetamine  (ADDERALL) 20 MG tablet, Take 1 tablet (20 mg total) by mouth 2 (two) times daily., Disp: 60 tablet, Rfl: 0   amphetamine -dextroamphetamine  (ADDERALL) 20 MG tablet, Take 1 tablet (20 mg total) by mouth 2 (two) times daily., Disp: 60 tablet, Rfl: 0   diclofenac  (VOLTAREN ) 50 MG EC tablet, Take 1 tablet (50 mg total) by mouth 2 (two) times daily as needed., Disp: 60 tablet, Rfl: 0   methylPREDNISolone  (MEDROL  DOSEPAK) 4 MG TBPK tablet, 6 day taper; take as directed on package instructions, Disp: 21 tablet, Rfl: 0  Review of Systems:  Negative unless indicated in HPI.   Physical Exam: Vitals:   07/08/24 1052  BP: 120/80  Pulse: 82  Temp: 97.8 F (36.6 C)  TempSrc: Oral  SpO2: 99%  Weight: 202 lb 8 oz (91.9 kg)    Body mass index is 30.79 kg/m.   Physical Exam   Impression and Plan:  Attention  deficit hyperactivity disorder (ADHD), predominantly inattentive type -     Amphetamine -Dextroamphetamine ; Take 1 tablet (20 mg total) by mouth 2 (two) times daily.  Dispense: 60 tablet; Refill: 0 -     Amphetamine -Dextroamphetamine ; Take 1 tablet (20 mg total) by mouth 2 (two) times daily.  Dispense: 60 tablet; Refill: 0 -     Amphetamine -Dextroamphetamine ; Take 1 tablet (20 mg total) by mouth 2 (two) times daily.  Dispense: 60 tablet; Refill: 0 -     Diclofenac  Sodium; Take 1 tablet (50 mg total) by mouth 2 (two) times daily as needed.  Dispense: 60 tablet; Refill: 0   - PDMP reviewed, no red flags, overdose risk score is 160. - Refill Adderall 20 mg to take 1 tablet twice daily for total of 60 tablets a month x 3 months.  Time spent:20 minutes reviewing chart, interviewing and examining patient and formulating plan of care.     Tully Theophilus Andrews, MD [facility]

## 2024-07-11 ENCOUNTER — Other Ambulatory Visit (HOSPITAL_BASED_OUTPATIENT_CLINIC_OR_DEPARTMENT_OTHER): Payer: Self-pay | Admitting: Nurse Practitioner

## 2024-07-11 DIAGNOSIS — Z780 Asymptomatic menopausal state: Secondary | ICD-10-CM

## 2024-07-23 ENCOUNTER — Other Ambulatory Visit: Payer: Self-pay | Admitting: Internal Medicine

## 2024-07-23 DIAGNOSIS — I10 Essential (primary) hypertension: Secondary | ICD-10-CM

## 2024-07-28 DIAGNOSIS — L570 Actinic keratosis: Secondary | ICD-10-CM | POA: Diagnosis not present

## 2024-07-28 DIAGNOSIS — L905 Scar conditions and fibrosis of skin: Secondary | ICD-10-CM | POA: Diagnosis not present

## 2024-07-28 DIAGNOSIS — Z85828 Personal history of other malignant neoplasm of skin: Secondary | ICD-10-CM | POA: Diagnosis not present

## 2024-07-28 DIAGNOSIS — L820 Inflamed seborrheic keratosis: Secondary | ICD-10-CM | POA: Diagnosis not present

## 2024-07-28 DIAGNOSIS — Z85820 Personal history of malignant melanoma of skin: Secondary | ICD-10-CM | POA: Diagnosis not present

## 2024-08-06 ENCOUNTER — Ambulatory Visit
Admission: RE | Admit: 2024-08-06 | Discharge: 2024-08-06 | Disposition: A | Discharge status: Home / Self Care | Source: Ambulatory Visit | Attending: Internal Medicine | Admitting: Internal Medicine

## 2024-08-06 DIAGNOSIS — Z1231 Encounter for screening mammogram for malignant neoplasm of breast: Secondary | ICD-10-CM

## 2024-08-09 ENCOUNTER — Encounter (INDEPENDENT_AMBULATORY_CARE_PROVIDER_SITE_OTHER): Payer: Self-pay | Admitting: Family Medicine

## 2024-08-09 ENCOUNTER — Ambulatory Visit (INDEPENDENT_AMBULATORY_CARE_PROVIDER_SITE_OTHER): Admitting: Family Medicine

## 2024-08-09 VITALS — BP 102/65 | HR 61 | Temp 97.8°F | Ht 68.0 in | Wt 200.0 lb

## 2024-08-09 DIAGNOSIS — E559 Vitamin D deficiency, unspecified: Secondary | ICD-10-CM | POA: Diagnosis not present

## 2024-08-09 DIAGNOSIS — E669 Obesity, unspecified: Secondary | ICD-10-CM | POA: Diagnosis not present

## 2024-08-09 DIAGNOSIS — R7303 Prediabetes: Secondary | ICD-10-CM | POA: Diagnosis not present

## 2024-08-09 DIAGNOSIS — F439 Reaction to severe stress, unspecified: Secondary | ICD-10-CM | POA: Diagnosis not present

## 2024-08-09 DIAGNOSIS — Z6830 Body mass index (BMI) 30.0-30.9, adult: Secondary | ICD-10-CM

## 2024-08-09 DIAGNOSIS — Z6829 Body mass index (BMI) 29.0-29.9, adult: Secondary | ICD-10-CM

## 2024-08-09 MED ORDER — METFORMIN HCL 500 MG PO TABS
500.0000 mg | ORAL_TABLET | Freq: Two times a day (BID) | ORAL | 1 refills | Status: DC
Start: 2024-08-09 — End: 2024-09-29

## 2024-08-09 NOTE — Progress Notes (Signed)
 Misty Benson, D.O.  ABFM, ABOM Specializing in Clinical Bariatric Medicine  Office located at: [address] [place], [place]  [address]   Assessment and Plan:   Medications Discontinued During This Encounter  Medication Reason   metFORMIN  (GLUCOPHAGE ) 500 MG tablet Reorder     Meds ordered this encounter  Medications   metFORMIN  (GLUCOPHAGE ) 500 MG tablet    Sig: Take 1 tablet (500 mg total) by mouth 2 (two) times daily with a meal.    Dispense:  60 tablet    Refill:  1    NEEDS OV FOR REFILLS     She was encouraged to f/up with her primary care within a month as she is due for a physical exam and labs.   FOR THE DISEASE OF OBESITY:  Obesity, Beginning BMI 37.86 BMI 29.0-29.9,adult Current BMI 30.42 Assessment & Plan:  05/06/24 14:00 08/09/24 12:00   Body Fat % 44.3 % 44.2 %  Muscle Mass (lbs) 105 lbs 106.2 lbs  Fat Mass (lbs) 88 lbs 88.6 lbs  Total Body Water (lbs) 82.6 lbs 80.6 lbs  Visceral Fat Rating   12    Total lbs lost to date: -49 lbs Total weight loss percentage to date: -19.68 %   Recommended Dietary Goals Misty Benson is currently in the action stage of change. As such, her goal is to continue weight management plan.  She has agreed to: continue current plan   Behavioral Intervention We discussed the following today: increasing water intake , work on tracking and journaling calories using tracking application, and practice mindfulness eating and understand the difference between hunger signals and cravings  Additional resources provided today: Handout on practicing mindfulness around eating  Evidence-based interventions for health behavior change were utilized today including the discussion of self monitoring techniques, problem-solving barriers and SMART goal setting techniques.   Regarding patient's less desirable eating habits and patterns, we employed the technique of small changes.   Goal(s) for next OV: increase water intake to 75-80  ounces/week    Recommended Physical Activity Goals Misty Benson has been advised to work up to 300-450 minutes of moderate intensity aerobic activity a week and strengthening exercises 2-3 times per week for cardiovascular health, weight loss maintenance and preservation of muscle mass.   She has agreed to continue walking 45 minutes 2 days/week and ADD an additional form of exercise (e.g workout videos) 30 minutes 2 days/week.   Pharmacotherapy See Pre-DM note.    ASSOCIATED CONDITIONS ADDRESSED TODAY:   Pre-diabetes Assessment & Plan:  Lab Results  Component Value Date   HGBA1C 5.4 12/16/2023   HGBA1C 5.6 08/21/2023   HGBA1C 5.4 02/11/2023   INSULIN  5.1 05/30/2022   INSULIN  9.5 04/05/2021   Prescribed Metformin  500 mg twice daily; sometimes misses doses. She denies GI upset when she does remember to take it. Hunger and cravings are stable when following her prescribed meal plan. Encouraged to take medication twice a day. Continue to decrease simple carbs/ sugars; increase fiber and proteins -> follow her meal plan.      Vitamin D  deficiency Assessment & Plan:  Lab Results  Component Value Date   VD25OH 35.58 08/21/2023   VD25OH 70.7 05/30/2022   VD25OH 41.1 04/05/2021   Currently on OTC Vitamin D  5,000 units 2 caps daily Monday through Friday and 1 cap daily Saturday and Sunday. Continue supplement and weight loss efforts. Recheck as deemed medically necessary.    Stress eating Assessment & Plan:  Pt has been more  stressed and anxious lately as she has been dealing with several things in her life. States the stress of life weighs down on her and she always feels tired and finds it hard to motivate herself to do self-care activities like exercise. She has also been stress eating at times.  Pt declines Wellbutrin because she has been on it in the past and found it ineffective. Reminded pt about our bariatric psychologist, however, pt declines referral for now. She will instead  focus on increasing mindful eating. Also recommended her to explore self help books (e.g Atomic Habits, Let Them Rule). Cont prudent nutritional plan which can support emotional well-being.     Follow up:   Return 09/14/2024 at 3:40 PM.  She was informed of the importance of frequent follow up visits to maximize her success with intensive lifestyle modifications for her multiple health conditions.   Subjective:    Chief complaint: Obesity Misty Benson is here to discuss her progress with her obesity treatment plan. She is keeping a food journal and adhering to recommended goals of 1300-1400 calories and 85+ g of protein  ( using the Category 2 Plan as a guide) and states she is following her eating plan approximately 75% of the time. Pt is walking 45 minutes 2 days per week   Interval History:  Misty Benson is here for a follow up office visit.   Pt has experienced a weight gain of 2 lbs since last OV on 05/06/2024.   Her dietary and life style habits include:  - Tracking Calories/Macros: yes  - Eating More Whole Foods: yes  - Adequate Protein Intake: no   - Adequate Water Intake: no - getting around 48 oz a day.  - Skipping Meals: no -   - Sleeping 7-9 Hours/ Night: yes   Pharmacotherapy that aid with weight loss: Prescribed Metformin  500 mg twice daily; sometimes misses doses.    Review of Systems:  Pertinent positives were addressed with patient today.  Reviewed by clinician on day of visit: allergies, medications, problem list, medical history, surgical history, family history, social history, and previous encounter notes.  Weight Summary and Biometrics   Weight Lost Since Last Visit: 0lb  Weight Gained Since Last Visit: 2lb   Vitals Temp: 97.8 F (36.6 C) BP: 102/65 Pulse Rate: 61 SpO2: 98 %   Anthropometric Measurements Height: 5' 8 (1.727 m) Weight: 200 lb (90.7 kg) BMI (Calculated): 30.42 Weight at Last Visit: 198lb Weight Lost Since Last Visit:  0lb Weight Gained Since Last Visit: 2lb Starting Weight: 249lb Total Weight Loss (lbs): 29 lb (13.2 kg)   Body Composition  Body Fat %: 44.2 % Fat Mass (lbs): 88.6 lbs Muscle Mass (lbs): 106.2 lbs Total Body Water (lbs): 80.6 lbs Visceral Fat Rating : 12   Other Clinical Data Fasting: no Labs: no Today's Visit #: 37lb Starting Date: 04/05/21    Objective:   PHYSICAL EXAM: Blood pressure 102/65, pulse 61, temperature 97.8 F (36.6 C), height 5' 8 (1.727 m), weight 200 lb (90.7 kg), last menstrual period 05/30/2012, SpO2 98%. Body mass index is 30.41 kg/m.  General: she is overweight, cooperative and in no acute distress. PSYCH: Has normal mood, affect and thought process.   HEENT: EOMI, sclerae are anicteric. Lungs: Normal breathing effort, no conversational dyspnea. Extremities: Moves * 4 Neurologic: A and O * 3, good insight  DIAGNOSTIC DATA REVIEWED: BMET    Component Value Date/Time   NA 141 08/21/2023 1401   NA 144 05/30/2022 1301  K 4.2 08/21/2023 1401   CL 104 08/21/2023 1401   CO2 31 08/21/2023 1401   GLUCOSE 92 08/21/2023 1401   BUN 26 (H) 08/21/2023 1401   BUN 30 (H) 05/30/2022 1301   CREATININE 0.75 08/21/2023 1401   CREATININE 0.78 02/23/2020 1230   CALCIUM 10.0 08/21/2023 1401   GFRNONAA >60 02/24/2016 1150   GFRAA >60 02/24/2016 1150   Lab Results  Component Value Date   HGBA1C 5.4 12/16/2023   HGBA1C 5.8 (H) 04/05/2021   Lab Results  Component Value Date   INSULIN  5.1 05/30/2022   INSULIN  9.5 04/05/2021   Lab Results  Component Value Date   TSH 1.64 08/21/2023   CBC    Component Value Date/Time   WBC 4.7 08/21/2023 1401   RBC 4.61 08/21/2023 1401   HGB 14.0 08/21/2023 1401   HGB 13.8 05/30/2022 1301   HCT 43.9 08/21/2023 1401   HCT 41.5 05/30/2022 1301   PLT 273.0 08/21/2023 1401   PLT 262 05/30/2022 1301   MCV 95.2 08/21/2023 1401   MCV 92 05/30/2022 1301   MCH 30.7 05/30/2022 1301   MCH 30.7 02/23/2020 1230   MCHC  31.9 08/21/2023 1401   RDW 13.8 08/21/2023 1401   RDW 11.9 05/30/2022 1301   Iron Studies No results found for: IRON, TIBC, FERRITIN, IRONPCTSAT Lipid Panel     Component Value Date/Time   CHOL 222 (H) 08/21/2023 1401   CHOL 194 05/30/2022 1301   TRIG 56.0 08/21/2023 1401   HDL 100.40 08/21/2023 1401   HDL 85 05/30/2022 1301   CHOLHDL 2 08/21/2023 1401   VLDL 11.2 08/21/2023 1401   LDLCALC 110 (H) 08/21/2023 1401   LDLCALC 96 05/30/2022 1301   LDLCALC 140 (H) 02/23/2020 1230   Hepatic Function Panel     Component Value Date/Time   PROT 6.1 08/21/2023 1401   PROT 6.2 05/30/2022 1301   ALBUMIN 4.4 08/21/2023 1401   ALBUMIN 4.4 05/30/2022 1301   AST 17 08/21/2023 1401   ALT 31 08/21/2023 1401   ALKPHOS 52 08/21/2023 1401   BILITOT 0.5 08/21/2023 1401   BILITOT 0.3 05/30/2022 1301   BILIDIR 0.0 07/30/2016 0859      Component Value Date/Time   TSH 1.64 08/21/2023 1401   Nutritional Lab Results  Component Value Date   VD25OH 35.58 08/21/2023   VD25OH 70.7 05/30/2022   VD25OH 41.1 04/05/2021    Attestations:   I, Special Puri, acting as a Stage manager for [employer], DO., have compiled all relevant documentation for today's office visit on behalf of Misty Jenkins, DO, while in the presence of [employer], DO.  I have spent 40 minutes in the care of the patient today including 28 minutes face-to-face assessing and reviewing listed medical problems above as outlined in office visit note and providing nutritional and behavioral counseling as outlined in obesity care plan.   I have reviewed the above documentation for accuracy and completeness, and I agree with the above. Misty JINNY Benson, D.O.  The 21st Century Cures Act was signed into law in 2016 which includes the topic of electronic health records.  This provides immediate access to information in MyChart. This includes consultation notes, operative notes, office notes, lab results and pathology  reports.  If you have any questions about what you read please let us  know at your next visit so we can discuss your concerns and take corrective action if need be. We are right here with you.

## 2024-09-06 DIAGNOSIS — L03031 Cellulitis of right toe: Secondary | ICD-10-CM | POA: Diagnosis not present

## 2024-09-14 ENCOUNTER — Ambulatory Visit (INDEPENDENT_AMBULATORY_CARE_PROVIDER_SITE_OTHER): Admitting: Family Medicine

## 2024-09-15 ENCOUNTER — Other Ambulatory Visit: Payer: Self-pay | Admitting: Internal Medicine

## 2024-09-15 DIAGNOSIS — I10 Essential (primary) hypertension: Secondary | ICD-10-CM

## 2024-09-20 DIAGNOSIS — L600 Ingrowing nail: Secondary | ICD-10-CM | POA: Diagnosis not present

## 2024-09-29 ENCOUNTER — Encounter (INDEPENDENT_AMBULATORY_CARE_PROVIDER_SITE_OTHER): Payer: Self-pay | Admitting: Adult Health

## 2024-09-29 ENCOUNTER — Ambulatory Visit (INDEPENDENT_AMBULATORY_CARE_PROVIDER_SITE_OTHER): Payer: Self-pay | Admitting: Adult Health

## 2024-09-29 VITALS — BP 138/82 | HR 80 | Temp 98.2°F | Ht 68.0 in | Wt 201.0 lb

## 2024-09-29 DIAGNOSIS — R7303 Prediabetes: Secondary | ICD-10-CM

## 2024-09-29 DIAGNOSIS — E559 Vitamin D deficiency, unspecified: Secondary | ICD-10-CM | POA: Diagnosis not present

## 2024-09-29 DIAGNOSIS — E669 Obesity, unspecified: Secondary | ICD-10-CM | POA: Diagnosis not present

## 2024-09-29 DIAGNOSIS — I10 Essential (primary) hypertension: Secondary | ICD-10-CM

## 2024-09-29 DIAGNOSIS — Z6830 Body mass index (BMI) 30.0-30.9, adult: Secondary | ICD-10-CM

## 2024-09-29 DIAGNOSIS — Z6829 Body mass index (BMI) 29.0-29.9, adult: Secondary | ICD-10-CM

## 2024-09-29 MED ORDER — METFORMIN HCL 500 MG PO TABS: 500.0000 mg | ORAL_TABLET | Freq: Two times a day (BID) | ORAL | 1 refills | Status: AC

## 2024-09-29 NOTE — Progress Notes (Signed)
 WEIGHT SUMMARY AND BIOMETRICS  Vitals Temp: 98.2 F (36.8 C) BP: 138/82 Pulse Rate: 80 SpO2: 99 %   Anthropometric Measurements Height: 5' 8 (1.727 m) Weight: 201 lb (91.2 kg) BMI (Calculated): 30.57 Weight at Last Visit: 200lb Weight Lost Since Last Visit: 0lb Weight Gained Since Last Visit: 1lb Starting Weight: 249lb Total Weight Loss (lbs): 28 lb (12.7 kg)   Body Composition  Body Fat %: 43.9 % Fat Mass (lbs): 88.2 lbs Muscle Mass (lbs): 107 lbs Total Body Water (lbs): 80.4 lbs Visceral Fat Rating : 12   Other Clinical Data Fasting: No Labs: No Today's Visit #: 41 Starting Date: 04/05/21    Chief Complaint:   OBESITY Trysta is here to discuss her progress with her obesity treatment plan.  She is on the the Category 2 Plan and states she is following her eating plan approximately 50 % of the time.  She states she is exercising Walking 30 minutes 3 times per week.  Interim History:  Last HWW OV 08/09/2024 with Dr. Midge She estimates to take both doses of Metformin  500mg  approximately 60% She often forgets to take second dose of Adderall 20mg  most days of the week. She is retired, however is quite busy with house/yard work. She has three adult children, ages: 20 yr old daughter, lives in [place] 76 yr old daughter, in [place] 93 yr old son, lived in [place]- graduate school  Reviewed Bioimpedance Results with pt: Muscle Mass: +0.8 lb Adipose Mss: -1.4 lb  Of Note- She has Follow- up with PCP for annual CPE and fasting labs 12/09/2023 at 1000  Subjective:   1. Vitamin D  deficiency  Latest Reference Range & Units 04/05/21 09:25 05/30/22 13:01 08/21/23 14:01  Vitamin D , 25-Hydroxy 30.0 - 100.0 ng/mL 41.1 70.7   VITD 30.00 - 100.00 ng/mL   35.58   She is on OTC Vit D 3 5000 international units  2 caps daily Monday- Friday 1 cap daily Saturday and Sunday  She is also on daily MVI  2. Essential hypertension BP stable at OV PCP manages  daily Verapamil  240mg  and daily hydrochlorothiazide  12.5mg   3. Pre-diabetes Lab Results  Component Value Date   HGBA1C 5.4 12/16/2023   HGBA1C 5.6 08/21/2023   HGBA1C 5.4 02/11/2023    She estimates to take both doses of Metformin  500mg  approximately 60% Discussed various dosing strategies, take BID or take both tabs with either breakfast or lunch  Assessment/Plan:   1. Vitamin D  deficiency (Primary) Monitor Labs  2. Essential hypertension Limit Na+ Increase regular walking  3. Pre-diabetes Increase regular walking Refill  metFORMIN  (GLUCOPHAGE ) 500 MG tablet Take 1 tablet (500 mg total) by mouth 2 (two) times daily with a meal. Dispense: 60 tablet, Refills: 1 ordered   4. BMI 29.0-29.9,adult Current BMI 30.6  Reiley is currently in the action stage of change. As such, her goal is to continue with weight loss efforts. She has agreed to the Category 2 Plan.   Exercise goals: For substantial health benefits, adults should do at least 150 minutes (2 hours and 30 minutes) a week of moderate-intensity, or 75 minutes (1 hour and 15 minutes) a week of vigorous-intensity aerobic physical activity, or an equivalent combination of moderate- and vigorous-intensity aerobic activity. Aerobic activity should be performed in episodes of at least 10 minutes, and preferably, it should be spread throughout the week.  Behavioral modification strategies: increasing lean protein intake, decreasing simple carbohydrates, increasing vegetables, increasing water intake, no skipping  meals, meal planning and cooking strategies, keeping healthy foods in the home, ways to avoid boredom eating, and planning for success.  Nailani has agreed to follow-up with our clinic in 4 weeks. She was informed of the importance of frequent follow-up visits to maximize her success with intensive lifestyle modifications for her multiple health conditions.   Follow- up with PCP for annual CPE and fasting labs 12/09/2023 at  1000  Objective:   Blood pressure 138/82, pulse 80, temperature 98.2 F (36.8 C), height 5' 8 (1.727 m), weight 201 lb (91.2 kg), last menstrual period 05/30/2012, SpO2 99%. Body mass index is 30.56 kg/m.  General: Cooperative, alert, well developed, in no acute distress. HEENT: Conjunctivae and lids unremarkable. Cardiovascular: Regular rhythm.  Lungs: Normal work of breathing. Neurologic: No focal deficits.   Lab Results  Component Value Date   CREATININE 0.75 08/21/2023   BUN 26 (H) 08/21/2023   NA 141 08/21/2023   K 4.2 08/21/2023   CL 104 08/21/2023   CO2 31 08/21/2023   Lab Results  Component Value Date   ALT 31 08/21/2023   AST 17 08/21/2023   ALKPHOS 52 08/21/2023   BILITOT 0.5 08/21/2023   Lab Results  Component Value Date   HGBA1C 5.4 12/16/2023   HGBA1C 5.6 08/21/2023   HGBA1C 5.4 02/11/2023   HGBA1C 5.5 05/30/2022   HGBA1C 5.4 10/02/2021   Lab Results  Component Value Date   INSULIN  5.1 05/30/2022   INSULIN  9.5 04/05/2021   Lab Results  Component Value Date   TSH 1.64 08/21/2023   Lab Results  Component Value Date   CHOL 222 (H) 08/21/2023   HDL 100.40 08/21/2023   LDLCALC 110 (H) 08/21/2023   TRIG 56.0 08/21/2023   CHOLHDL 2 08/21/2023   Lab Results  Component Value Date   VD25OH 35.58 08/21/2023   VD25OH 70.7 05/30/2022   VD25OH 41.1 04/05/2021   Lab Results  Component Value Date   WBC 4.7 08/21/2023   HGB 14.0 08/21/2023   HCT 43.9 08/21/2023   MCV 95.2 08/21/2023   PLT 273.0 08/21/2023   No results found for: IRON, TIBC, FERRITIN  Attestation Statements:   Reviewed by clinician on day of visit: allergies, medications, problem list, medical history, surgical history, family history, social history, and previous encounter notes.  I have reviewed the above documentation for accuracy and completeness, and I agree with the above. -  Fleta Borgeson d. Claudia Greenley, NP-C

## 2024-10-13 ENCOUNTER — Ambulatory Visit (HOSPITAL_COMMUNITY)
Admission: RE | Admit: 2024-10-13 | Discharge: 2024-10-13 | Disposition: A | Source: Ambulatory Visit | Attending: Family Medicine | Admitting: Family Medicine

## 2024-10-13 ENCOUNTER — Ambulatory Visit (INDEPENDENT_AMBULATORY_CARE_PROVIDER_SITE_OTHER): Admitting: Family Medicine

## 2024-10-13 ENCOUNTER — Ambulatory Visit: Payer: Self-pay | Admitting: Family Medicine

## 2024-10-13 VITALS — BP 128/78 | HR 69 | Temp 98.4°F | Wt 205.0 lb

## 2024-10-13 DIAGNOSIS — M7989 Other specified soft tissue disorders: Secondary | ICD-10-CM | POA: Insufficient documentation

## 2024-10-13 NOTE — Progress Notes (Signed)
   Subjective:    Patient ID: Misty Benson, female    DOB: 07-05-1961, 63 y.o.   MRN: [id number]  HPI Here to check a lump on the lower left leg that she felt 2 days ago. It has not been painful. No hx of blood clots. She is concerned because she will be flying to [place] next week.    Review of Systems  Constitutional: Negative.   Respiratory: Negative.    Cardiovascular: Negative.        Objective:   Physical Exam Constitutional:      Appearance: Normal appearance.  Cardiovascular:     Rate and Rhythm: Normal rate and regular rhythm.     Pulses: Normal pulses.     Heart sounds: Normal heart sounds.  Pulmonary:     Effort: Pulmonary effort is normal.     Breath sounds: Normal breath sounds.  Skin:    Comments: She has scattered spider veins on both lower legs. On the medial side of the left lower leg there is a firm, mobile, non-tender lump that is about 4 mm in length. No swelling or tenderness in the foot or leg  Neurological:     Mental Status: She is alert.           Assessment & Plan:  This is most likely a palpable valve in a varicose vein, but to be sure we will set up a venous doppler of the left leg.  Garnette Olmsted, MD

## 2024-10-18 DIAGNOSIS — L570 Actinic keratosis: Secondary | ICD-10-CM | POA: Diagnosis not present

## 2024-10-18 DIAGNOSIS — L738 Other specified follicular disorders: Secondary | ICD-10-CM | POA: Diagnosis not present

## 2024-10-18 DIAGNOSIS — Z85820 Personal history of malignant melanoma of skin: Secondary | ICD-10-CM | POA: Diagnosis not present

## 2024-10-18 DIAGNOSIS — D1724 Benign lipomatous neoplasm of skin and subcutaneous tissue of left leg: Secondary | ICD-10-CM | POA: Diagnosis not present

## 2024-10-18 DIAGNOSIS — Z85828 Personal history of other malignant neoplasm of skin: Secondary | ICD-10-CM | POA: Diagnosis not present

## 2024-10-18 DIAGNOSIS — L820 Inflamed seborrheic keratosis: Secondary | ICD-10-CM | POA: Diagnosis not present

## 2024-10-30 ENCOUNTER — Other Ambulatory Visit: Payer: Self-pay | Admitting: Internal Medicine

## 2024-10-30 DIAGNOSIS — F900 Attention-deficit hyperactivity disorder, predominantly inattentive type: Secondary | ICD-10-CM

## 2024-10-30 DIAGNOSIS — I10 Essential (primary) hypertension: Secondary | ICD-10-CM

## 2024-11-09 ENCOUNTER — Ambulatory Visit: Payer: Self-pay

## 2024-11-09 NOTE — Telephone Encounter (Signed)
 FYI Only or Action Required?: FYI only for provider: Home Care.  Patient was last seen in primary care on 10/13/2024 by Johnny Garnette LABOR, MD.  Called Nurse Triage reporting Cough.  Symptoms began several days ago.  Interventions attempted: Rest, hydration, or home remedies.  Symptoms are: gradually improving.  Triage Disposition: Home Care  Patient/caregiver understands and will follow disposition?: Yes  Reason for Disposition  [1] Probable mild influenza (no fever) or a common cold, with no complications AND [2] NOT HIGH RISK  Answer Assessment - Initial Assessment Questions Pt reports 3 day hx of congestion, coughing and needing to clear her throat. Pt preferred home care vs. OV. Advised to call if symptoms worsen or if she decides she would like to be seen. Also advised to call if she takes a home covid/flu test that is positive.  Pt denies being immunocompromised or living with anyone immunocompromised.   1. SYMPTOMS: What is your main symptom or concern? (e.g., cough, fever, shortness of breath, muscle aches)      See above 2. ONSET: When did the symptoms start?      3 days 3. COUGH: Do you have a cough? If Yes, ask: How bad is the cough?       Yes 4. FEVER: Do you have a fever? If Yes, ask: What is your temperature, how was it measured, and when did it start?     Denies  Protocols used: Influenza (Flu) Suspected-A-AH Message from Brittany M sent at 11/09/2024 10:45 AM EST  Reason for Triage: Cold symptoms-congestion and coughing- wanting to know if she could possibly get tamaflu- started Sunday-

## 2024-11-24 ENCOUNTER — Ambulatory Visit (INDEPENDENT_AMBULATORY_CARE_PROVIDER_SITE_OTHER): Admitting: Adult Health

## 2024-11-24 ENCOUNTER — Encounter (INDEPENDENT_AMBULATORY_CARE_PROVIDER_SITE_OTHER): Payer: Self-pay | Admitting: Adult Health

## 2024-11-24 VITALS — HR 75 | Temp 98.7°F | Ht 68.0 in | Wt 198.0 lb

## 2024-11-24 DIAGNOSIS — I10 Essential (primary) hypertension: Secondary | ICD-10-CM | POA: Diagnosis not present

## 2024-11-24 DIAGNOSIS — R7303 Prediabetes: Secondary | ICD-10-CM

## 2024-11-24 DIAGNOSIS — E669 Obesity, unspecified: Secondary | ICD-10-CM

## 2024-11-24 DIAGNOSIS — Z6830 Body mass index (BMI) 30.0-30.9, adult: Secondary | ICD-10-CM

## 2024-11-24 DIAGNOSIS — E559 Vitamin D deficiency, unspecified: Secondary | ICD-10-CM | POA: Diagnosis not present

## 2024-11-24 DIAGNOSIS — Z6829 Body mass index (BMI) 29.0-29.9, adult: Secondary | ICD-10-CM

## 2024-11-24 NOTE — Progress Notes (Signed)
 "    WEIGHT SUMMARY AND BIOMETRICS  Vitals Temp: 98.7 F (37.1 C) Pulse Rate: 75 SpO2: 99 %   Anthropometric Measurements Height: 5' 8 (1.727 m) Weight: 198 lb (89.8 kg) BMI (Calculated): 30.11 Weight at Last Visit: 201lb Weight Lost Since Last Visit: 3lb Weight Gained Since Last Visit: 0lb Starting Weight: 249lb Total Weight Loss (lbs): 31 lb (14.1 kg)   Body Composition  Body Fat %: 43.2 % Fat Mass (lbs): 85.8 lbs Muscle Mass (lbs): 107 lbs Total Body Water (lbs): 76.4 lbs Visceral Fat Rating : 11   Other Clinical Data Fasting: No Labs: No Today's Visit #: 52 Starting Date: 04/05/21    Chief Complaint:   OBESITY Misty Benson is here to discuss her progress with her obesity treatment plan.  She is on the the Category 2 Plan and states she is following her eating plan approximately 60 % of the time.  She states she is exercising Walking 20 minutes 4 times per week.  Interim History:  Over the last 2 months: She celebrated holidays Travelled to [place], [employer] Acutely ill with cold-like sx's over Christmas  Reviewed Bioimpedance Results with pt: Muscle Mass: No Change Adipose Mass: -2.4 lbs  Of Note- She has Follow- up with PCP for annual CPE and fasting labs 12/09/2023 at 1000 Subjective:   1. Essential hypertension BP measured, yet not entered into chart. MA unable to recall reading, per pt I think it was fine Patient denies acute cardiac sx's EPIC review, BP has been well controlled for months  2. Vitamin D  deficiency She has been increasing walking, striving to walk each evening (weather permitting)  Latest Reference Range & Units 08/21/23 14:01  VITD 30.00 - 100.00 ng/mL 35.58   She is on Cholecalciferol (VITAMIN D3) 125 MCG (5000 UT) CAPS Take 2 caps daily Monday through Friday and 1 cap daily Saturday and Sunday    3. Pre-diabetes She has been taking Metformin  500mg  1 -2 tabs daily. She denies GI upset with Metformin  She endorses stable  appetite  Assessment/Plan:   1. Essential hypertension (Primary) Continue healthy eating and regular walking  2. Vitamin D  deficiency Continue current supplementation Monitor Labs  3. Pre-diabetes Strive to take Metformin  500mmg BID with meals  4. BMI 29.0-29.9,adult Current BMI 30.2  Misty Benson is currently in the action stage of change. As such, her goal is to continue with weight loss efforts. She has agreed to the Category 2 Plan.   Exercise goals: For substantial health benefits, adults should do at least 150 minutes (2 hours and 30 minutes) a week of moderate-intensity, or 75 minutes (1 hour and 15 minutes) a week of vigorous-intensity aerobic physical activity, or an equivalent combination of moderate- and vigorous-intensity aerobic activity. Aerobic activity should be performed in episodes of at least 10 minutes, and preferably, it should be spread throughout the week.  Behavioral modification strategies: increasing lean protein intake, decreasing simple carbohydrates, increasing vegetables, increasing water intake, no skipping meals, meal planning and cooking strategies, keeping healthy foods in the home, ways to avoid boredom eating, and planning for success.  Misty Benson has agreed to follow-up with our clinic in 4 weeks. She was informed of the importance of frequent follow-up visits to maximize her success with intensive lifestyle modifications for her multiple health conditions.   Objective:   Pulse 75, temperature 98.7 F (37.1 C), height 5' 8 (1.727 m), weight 198 lb (89.8 kg), last menstrual period 05/30/2012, SpO2 99%. Body mass index is 30.11 kg/m.  General: Cooperative,  alert, well developed, in no acute distress. HEENT: Conjunctivae and lids unremarkable. Cardiovascular: Regular rhythm.  Lungs: Normal work of breathing. Neurologic: No focal deficits.   Lab Results  Component Value Date   CREATININE 0.75 08/21/2023   BUN 26 (H) 08/21/2023   NA 141 08/21/2023   K  4.2 08/21/2023   CL 104 08/21/2023   CO2 31 08/21/2023   Lab Results  Component Value Date   ALT 31 08/21/2023   AST 17 08/21/2023   ALKPHOS 52 08/21/2023   BILITOT 0.5 08/21/2023   Lab Results  Component Value Date   HGBA1C 5.4 12/16/2023   HGBA1C 5.6 08/21/2023   HGBA1C 5.4 02/11/2023   HGBA1C 5.5 05/30/2022   HGBA1C 5.4 10/02/2021   Lab Results  Component Value Date   INSULIN  5.1 05/30/2022   INSULIN  9.5 04/05/2021   Lab Results  Component Value Date   TSH 1.64 08/21/2023   Lab Results  Component Value Date   CHOL 222 (H) 08/21/2023   HDL 100.40 08/21/2023   LDLCALC 110 (H) 08/21/2023   TRIG 56.0 08/21/2023   CHOLHDL 2 08/21/2023   Lab Results  Component Value Date   VD25OH 35.58 08/21/2023   VD25OH 70.7 05/30/2022   VD25OH 41.1 04/05/2021   Lab Results  Component Value Date   WBC 4.7 08/21/2023   HGB 14.0 08/21/2023   HCT 43.9 08/21/2023   MCV 95.2 08/21/2023   PLT 273.0 08/21/2023   No results found for: IRON, TIBC, FERRITIN  Attestation Statements:   Reviewed by clinician on day of visit: allergies, medications, problem list, medical history, surgical history, family history, social history, and previous encounter notes.  Time spent on visit including pre-visit chart review and post-visit care and charting was 27 minutes.   I have reviewed the above documentation for accuracy and completeness, and I agree with the above. -  Kennedee Kitzmiller d. Harrold Fitchett, NP-C "

## 2024-12-08 ENCOUNTER — Ambulatory Visit: Admitting: Internal Medicine

## 2024-12-08 ENCOUNTER — Encounter: Payer: Self-pay | Admitting: Internal Medicine

## 2024-12-08 VITALS — BP 133/72 | HR 70 | Temp 98.4°F | Ht 67.75 in | Wt 203.1 lb

## 2024-12-08 DIAGNOSIS — E7800 Pure hypercholesterolemia, unspecified: Secondary | ICD-10-CM | POA: Diagnosis not present

## 2024-12-08 DIAGNOSIS — R7303 Prediabetes: Secondary | ICD-10-CM

## 2024-12-08 DIAGNOSIS — E559 Vitamin D deficiency, unspecified: Secondary | ICD-10-CM

## 2024-12-08 DIAGNOSIS — Z0000 Encounter for general adult medical examination without abnormal findings: Secondary | ICD-10-CM

## 2024-12-08 DIAGNOSIS — Z6830 Body mass index (BMI) 30.0-30.9, adult: Secondary | ICD-10-CM

## 2024-12-08 DIAGNOSIS — M62830 Muscle spasm of back: Secondary | ICD-10-CM | POA: Diagnosis not present

## 2024-12-08 DIAGNOSIS — I10 Essential (primary) hypertension: Secondary | ICD-10-CM

## 2024-12-08 DIAGNOSIS — Z23 Encounter for immunization: Secondary | ICD-10-CM | POA: Diagnosis not present

## 2024-12-08 DIAGNOSIS — F900 Attention-deficit hyperactivity disorder, predominantly inattentive type: Secondary | ICD-10-CM

## 2024-12-08 LAB — CBC WITH DIFFERENTIAL/PLATELET
Basophils Absolute: 0 10*3/uL (ref 0.0–0.1)
Basophils Relative: 1 % (ref 0.0–3.0)
Eosinophils Absolute: 0.1 10*3/uL (ref 0.0–0.7)
Eosinophils Relative: 2.6 % (ref 0.0–5.0)
HCT: 41.5 % (ref 36.0–46.0)
Hemoglobin: 13.9 g/dL (ref 12.0–15.0)
Lymphocytes Relative: 26.5 % (ref 12.0–46.0)
Lymphs Abs: 1.2 10*3/uL (ref 0.7–4.0)
MCHC: 33.5 g/dL (ref 30.0–36.0)
MCV: 92.3 fl (ref 78.0–100.0)
Monocytes Absolute: 0.4 10*3/uL (ref 0.1–1.0)
Monocytes Relative: 8.7 % (ref 3.0–12.0)
Neutro Abs: 2.7 10*3/uL (ref 1.4–7.7)
Neutrophils Relative %: 61.2 % (ref 43.0–77.0)
Platelets: 270 10*3/uL (ref 150.0–400.0)
RBC: 4.49 Mil/uL (ref 3.87–5.11)
RDW: 12.9 % (ref 11.5–15.5)
WBC: 4.4 10*3/uL (ref 4.0–10.5)

## 2024-12-08 LAB — LIPID PANEL
Cholesterol: 216 mg/dL — ABNORMAL HIGH (ref 28–200)
HDL: 94.1 mg/dL
LDL Cholesterol: 108 mg/dL — ABNORMAL HIGH (ref 10–99)
NonHDL: 122.13
Total CHOL/HDL Ratio: 2
Triglycerides: 73 mg/dL (ref 10.0–149.0)
VLDL: 14.6 mg/dL (ref 0.0–40.0)

## 2024-12-08 LAB — COMPREHENSIVE METABOLIC PANEL WITH GFR
ALT: 25 U/L (ref 3–35)
AST: 20 U/L (ref 5–37)
Albumin: 4.3 g/dL (ref 3.5–5.2)
Alkaline Phosphatase: 53 U/L (ref 39–117)
BUN: 24 mg/dL — ABNORMAL HIGH (ref 6–23)
CO2: 30 meq/L (ref 19–32)
Calcium: 10 mg/dL (ref 8.4–10.5)
Chloride: 105 meq/L (ref 96–112)
Creatinine, Ser: 0.71 mg/dL (ref 0.40–1.20)
GFR: 89.9 mL/min
Glucose, Bld: 99 mg/dL (ref 70–99)
Potassium: 4.1 meq/L (ref 3.5–5.1)
Sodium: 140 meq/L (ref 135–145)
Total Bilirubin: 0.5 mg/dL (ref 0.2–1.2)
Total Protein: 7 g/dL (ref 6.0–8.3)

## 2024-12-08 LAB — HEMOGLOBIN A1C: Hgb A1c MFr Bld: 5.6 % (ref 4.6–6.5)

## 2024-12-08 MED ORDER — AMPHETAMINE-DEXTROAMPHETAMINE 20 MG PO TABS: 20.0000 mg | ORAL_TABLET | Freq: Two times a day (BID) | ORAL | 0 refills | Status: AC

## 2024-12-08 MED ORDER — CYCLOBENZAPRINE HCL 10 MG PO TABS: 5.0000 mg | ORAL_TABLET | Freq: Three times a day (TID) | ORAL | 0 refills | Status: AC | PRN

## 2024-12-08 NOTE — Progress Notes (Signed)
 "    Established Patient Office Visit     CC/Reason for Visit: Annual preventive exam and follow-up chronic conditions  HPI: Misty Benson is a 64 y.o. female who is coming in today for the above mentioned reasons. Past Medical History is significant for: Hypertension, hyperlipidemia, impaired glucose tolerance, obesity, vitamin D  deficiency, ADHD.  Feeling well without acute concerns or complaints.  Has routine eye and dental care.  Is due for pneumonia and tetanus vaccines.  All cancer screening is up-to-date.  Is due for Adderall refills per protocol.   Past Medical/Surgical History: Past Medical History:  Diagnosis Date   ADHD (attention deficit hyperactivity disorder)    Anxiety    Back pain    Cancer (HCC)    BASAL CELL FACE AND SHOULDERS , SQUAMOS CELL ON HAND AND MELANOMA LEFT ARM...   Cervix cancer (HCC) 1987   microinvasive   Constipation    Edema of both lower extremities    Hypertension    Joint pain    Leiomyoma 2013   multiple small   Melanoma (HCC)    Obesity    Prediabetes    Shoulder pain    Snoring     Past Surgical History:  Procedure Laterality Date   arm surgery     to remove melanoma on left upper outter arm   BUNIONECTOMY     LEFT FOOT WITH PINS   CERVICAL CONE BIOPSY  11/11/1985   CESAREAN SECTION     DILATION AND CURETTAGE OF UTERUS     x2   FOOT SURGERY     MELANOMA EXCISION     TONSILLECTOMY     as a child   TOTAL HIP ARTHROPLASTY Left 06/06/2022    Social History:  reports that she has never smoked. She has never used smokeless tobacco. She reports current alcohol use of about 4.0 standard drinks of alcohol per week. She reports that she does not use drugs.  Allergies: Allergies[1]  Family History:  Family History  Problem Relation Age of Onset   Stroke Mother    Osteoporosis Mother    Hypertension Mother    Hyperlipidemia Father    Atrial fibrillation Father    Cancer Father        Prostate   Heart defect Father     Breast cancer Maternal Grandmother 54    Current Medications[2]  Review of Systems:  Negative unless indicated in HPI.   Physical Exam: Vitals:   12/08/24 1007 12/08/24 1011  BP: (!) 150/98 133/72  Pulse: 70   Temp: 98.4 F (36.9 C)   TempSrc: Oral   SpO2: 98%   Weight: 203 lb 1.6 oz (92.1 kg)   Height: 5' 7.75 (1.721 m)     Body mass index is 31.11 kg/m.   Physical Exam Vitals reviewed.  Constitutional:      General: She is not in acute distress.    Appearance: Normal appearance. She is not ill-appearing, toxic-appearing or diaphoretic.  HENT:     Head: Normocephalic.     Right Ear: Tympanic membrane, ear canal and external ear normal. There is no impacted cerumen.     Left Ear: Tympanic membrane, ear canal and external ear normal. There is no impacted cerumen.     Nose: Nose normal.     Mouth/Throat:     Mouth: Mucous membranes are moist.     Pharynx: Oropharynx is clear. No oropharyngeal exudate or posterior oropharyngeal erythema.  Eyes:     General: No  scleral icterus.       Right eye: No discharge.        Left eye: No discharge.     Conjunctiva/sclera: Conjunctivae normal.     Pupils: Pupils are equal, round, and reactive to light.  Neck:     Vascular: No carotid bruit.  Cardiovascular:     Rate and Rhythm: Normal rate and regular rhythm.     Pulses: Normal pulses.     Heart sounds: Normal heart sounds.  Pulmonary:     Effort: Pulmonary effort is normal. No respiratory distress.     Breath sounds: Normal breath sounds.  Abdominal:     General: Abdomen is flat. Bowel sounds are normal.     Palpations: Abdomen is soft.  Musculoskeletal:        General: Normal range of motion.     Cervical back: Normal range of motion.  Skin:    General: Skin is warm and dry.  Neurological:     General: No focal deficit present.     Mental Status: She is alert and oriented to person, place, and time. Mental status is at baseline.  Psychiatric:        Mood and  Affect: Mood normal.        Behavior: Behavior normal.        Thought Content: Thought content normal.        Judgment: Judgment normal.      Impression and Plan:  Encounter for preventive health examination  Essential hypertension -     CBC with Differential/Platelet; Future -     Comprehensive metabolic panel with GFR; Future  Vitamin D  deficiency -     VITAMIN D  25 Hydroxy (Vit-D Deficiency, Fractures); Future  Prediabetes -     Hemoglobin A1c; Future  Pure hypercholesterolemia -     Lipid panel; Future  Attention deficit hyperactivity disorder (ADHD), predominantly inattentive type -     Amphetamine -Dextroamphetamine ; Take 1 tablet (20 mg total) by mouth 2 (two) times daily.  Dispense: 60 tablet; Refill: 0 -     Amphetamine -Dextroamphetamine ; Take 1 tablet (20 mg total) by mouth 2 (two) times daily.  Dispense: 60 tablet; Refill: 0 -     Amphetamine -Dextroamphetamine ; Take 1 tablet (20 mg total) by mouth 2 (two) times daily.  Dispense: 60 tablet; Refill: 0  Lumbar paraspinal muscle spasm -     Cyclobenzaprine  HCl; Take 0.5-1 tablets (5-10 mg total) by mouth 3 (three) times daily as needed.  Dispense: 30 tablet; Refill: 0  Immunization due  BMI 30.0-30.9,adult -     TSH; Future -     Vitamin B12; Future   -Recommend routine eye and dental care. -Healthy lifestyle discussed in detail. -Labs to be updated today. -Prostate cancer screening: Not applicable Health Maintenance  Topic Date Due   Pneumococcal Vaccine for age over 51 (1 of 1 - PCV) Never done   DTaP/Tdap/Td vaccine (3 - Td or Tdap) 11/18/2023   COVID-19 Vaccine (5 - Pfizer risk 2025-26 season) 01/09/2025   Pap with HPV screening  09/06/2025   Breast Cancer Screening  08/06/2026   Colon Cancer Screening  11/22/2030   Flu Shot  Completed   HPV Vaccine (No Doses Required) Completed   Hepatitis C Screening  Completed   HIV Screening  Completed   Zoster (Shingles) Vaccine  Completed   Hepatitis B Vaccine   Aged Out   Meningitis B Vaccine  Aged Out    - Tdap and PCV 20 in office today.  Tully Theophilus Andrews, MD [facility]     [1] No Known Allergies [2]  Current Outpatient Medications:    Cholecalciferol (VITAMIN D3) 125 MCG (5000 UT) CAPS, Take 2 caps daily Monday through Friday and 1 cap daily Saturday and Sunday., Disp: , Rfl:    citalopram  (CELEXA ) 20 MG tablet, TAKE 1 TABLET(20 MG) BY MOUTH DAILY, Disp: 90 tablet, Rfl: 1   diclofenac  (VOLTAREN ) 50 MG EC tablet, Take 1 tablet (50 mg total) by mouth 2 (two) times daily as needed., Disp: 60 tablet, Rfl: 0   hydrochlorothiazide  (MICROZIDE ) 12.5 MG capsule, TAKE 1 CAPSULE(12.5 MG) BY MOUTH DAILY, Disp: 90 capsule, Rfl: 0   ibuprofen  (ADVIL ,MOTRIN ) 200 MG tablet, Take 3-4 tablets (600-800 mg total) by mouth every 6 (six) hours as needed for fever, headache, mild pain, moderate pain or cramping., Disp: 30 tablet, Rfl: 6   magnesium 30 MG tablet, Take 30 mg by mouth at bedtime., Disp: , Rfl:    metFORMIN  (GLUCOPHAGE ) 500 MG tablet, Take 1 tablet (500 mg total) by mouth 2 (two) times daily with a meal., Disp: 60 tablet, Rfl: 1   Multiple Vitamin (MULTIVITAMIN PO), Take by mouth., Disp: , Rfl:    Omega-3 1000 MG CAPS, Take by mouth., Disp: , Rfl:    verapamil  (CALAN -SR) 240 MG CR tablet, TAKE 1 TABLET(240 MG) BY MOUTH DAILY, Disp: 90 tablet, Rfl: 0   amphetamine -dextroamphetamine  (ADDERALL) 20 MG tablet, Take 1 tablet (20 mg total) by mouth 2 (two) times daily., Disp: 60 tablet, Rfl: 0   amphetamine -dextroamphetamine  (ADDERALL) 20 MG tablet, Take 1 tablet (20 mg total) by mouth 2 (two) times daily., Disp: 60 tablet, Rfl: 0   amphetamine -dextroamphetamine  (ADDERALL) 20 MG tablet, Take 1 tablet (20 mg total) by mouth 2 (two) times daily., Disp: 60 tablet, Rfl: 0   cyclobenzaprine  (FLEXERIL ) 10 MG tablet, Take 0.5-1 tablets (5-10 mg total) by mouth 3 (three) times daily as needed., Disp: 30 tablet, Rfl: 0  "

## 2024-12-09 ENCOUNTER — Ambulatory Visit: Payer: Self-pay | Admitting: Internal Medicine

## 2024-12-09 LAB — TSH: TSH: 2.07 u[IU]/mL (ref 0.35–5.50)

## 2024-12-09 LAB — VITAMIN D 25 HYDROXY (VIT D DEFICIENCY, FRACTURES): VITD: 59.75 ng/mL (ref 30.00–100.00)

## 2024-12-09 LAB — VITAMIN B12: Vitamin B-12: 446 pg/mL (ref 211–911)

## 2024-12-16 ENCOUNTER — Other Ambulatory Visit: Payer: Self-pay | Admitting: Internal Medicine

## 2024-12-16 DIAGNOSIS — I10 Essential (primary) hypertension: Secondary | ICD-10-CM

## 2024-12-22 ENCOUNTER — Ambulatory Visit (INDEPENDENT_AMBULATORY_CARE_PROVIDER_SITE_OTHER): Admitting: Adult Health

## 2025-01-04 ENCOUNTER — Ambulatory Visit (INDEPENDENT_AMBULATORY_CARE_PROVIDER_SITE_OTHER): Admitting: Nurse Practitioner
